# Patient Record
Sex: Male | Born: 1995 | Hispanic: Yes | Smoking: Never smoker
Health system: Southern US, Community
[De-identification: ages and names within clinical notes are randomized; demographics above are authoritative.]

## PROBLEM LIST (undated history)

## (undated) DIAGNOSIS — R569 Unspecified convulsions: Secondary | ICD-10-CM

## (undated) HISTORY — DX: Unspecified convulsions: R56.9

---
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
## 2017-02-20 ENCOUNTER — Encounter: Payer: Self-pay | Admitting: *Deleted

## 2017-02-20 ENCOUNTER — Emergency Department
Admission: EM | Admit: 2017-02-20 | Discharge: 2017-02-20 | Disposition: A | Discharge status: Home / Self Care | Payer: Medicaid Other | Attending: Emergency Medicine | Admitting: Emergency Medicine

## 2017-02-20 DIAGNOSIS — R21 Rash and other nonspecific skin eruption: Secondary | ICD-10-CM | POA: Diagnosis present

## 2017-02-20 DIAGNOSIS — L739 Follicular disorder, unspecified: Secondary | ICD-10-CM

## 2017-02-20 DIAGNOSIS — L02211 Cutaneous abscess of abdominal wall: Secondary | ICD-10-CM | POA: Insufficient documentation

## 2017-02-20 DIAGNOSIS — L02221 Furuncle of abdominal wall: Secondary | ICD-10-CM | POA: Diagnosis not present

## 2017-02-20 DIAGNOSIS — L02219 Cutaneous abscess of trunk, unspecified: Secondary | ICD-10-CM

## 2017-02-20 MED ORDER — DIPHENHYDRAMINE HCL 25 MG PO CAPS
25.0000 mg | ORAL_CAPSULE | Freq: Once | ORAL | Status: AC
Start: 2017-02-20 — End: 2017-02-20
  Administered 2017-02-20: 25 mg via ORAL
  Filled 2017-02-20: qty 1

## 2017-02-20 MED ORDER — SULFAMETHOXAZOLE-TRIMETHOPRIM 800-160 MG PO TABS: 1.0000 | ORAL_TABLET | Freq: Two times a day (BID) | ORAL | 0 refills | Status: DC

## 2017-02-20 MED ORDER — SULFAMETHOXAZOLE-TRIMETHOPRIM 800-160 MG PO TABS
1.0000 | ORAL_TABLET | Freq: Once | ORAL | Status: AC
  Administered 2017-02-20: 1 via ORAL
  Filled 2017-02-20: qty 1

## 2017-02-20 MED ORDER — TRAMADOL HCL 50 MG PO TABS: 50.0000 mg | ORAL_TABLET | Freq: Four times a day (QID) | ORAL | 0 refills | Status: DC | PRN

## 2017-02-20 NOTE — ED Notes (Signed)
Patient reports pain begins at area of abscesses and radiates down right leg

## 2017-02-20 NOTE — ED Provider Notes (Signed)
[facility] Emergency Department Provider Note  ____________________________________________  Time seen: Approximately 8:46 PM  I have reviewed the triage vital signs and the nursing notes.   HISTORY  Chief Complaint Abscess   HPI Mario Davis is a 21 y.o. male who presents to the emergency department for evaluationof pruritic and now painful rash to his pubic area. Symptoms started about 5 days ago. The area started out as just small bumps that itched but have progressively worsened. He has applied calamine lotion without relief of the itching. The initial spot is now an open sore due to scratching. He denies similar symptoms previously or new exposures. He denies fever.  History reviewed. No pertinent past medical history.  Patient Active Problem List   Diagnosis Date Noted  . Abdominal pain in male 04/14/2015    History reviewed. No pertinent surgical history.  Prior to Admission medications   Medication Sig Start Date End Date Taking? Authorizing Provider  sulfamethoxazole-trimethoprim (BACTRIM DS,SEPTRA DS) 800-160 MG tablet Take 1 tablet by mouth 2 (two) times daily. 02/20/17   Chinita Pester, FNP  traMADol (ULTRAM) 50 MG tablet Take 1 tablet (50 mg total) by mouth every 6 (six) hours as needed. 02/20/17   Chinita Pester, FNP    Allergies Patient has no known allergies.  No family history on file.  Social History Social History  Substance Use Topics  . Smoking status: Never Smoker  . Smokeless tobacco: Never Used  . Alcohol use No    Review of Systems  Constitutional: Negative for fever/chills Respiratory: Negative for shortness of breath. Musculoskeletal: Negative for pain. Skin: Positive for rash of the pubic area Neurological: Negative for headaches, focal weakness or numbness. ____________________________________________   PHYSICAL EXAM:  VITAL SIGNS: ED Triage Vitals  Enc Vitals Group     BP 02/20/17 1928 116/71     Pulse  Rate 02/20/17 1932 71     Resp 02/20/17 1928 18     Temp 02/20/17 1928 98.1 F (36.7 C)     Temp Source 02/20/17 1928 Oral     SpO2 02/20/17 1932 98 %     Weight 02/20/17 1928 130 lb (59 kg)     Height 02/20/17 1928 6' (1.829 m)     Head Circumference --      Peak Flow --      Pain Score 02/20/17 1933 9     Pain Loc --      Pain Edu? --      Excl. in GC? --      Constitutional: Alert and oriented. Well appearing and in no acute distress. Eyes: Conjunctivae are normal. EOMI. Mouth/Throat: Mucous membranes are moist.   Lymphatic: No inguinal lymphadenopathy. Cardiovascular: Good peripheral circulation. Respiratory: Normal respiratory effort.  No retractions. Musculoskeletal: FROM throughout. Neurologic:  Normal speech and language. No gross focal neurologic deficits are appreciated. Skin:  Pustules noted over the pubic area. The initial area is open and a scab is beginning to form. There is no active bleeding. Mid pubic area with a raised, tender area that appears to have been draining recently. No area of fluctuance on exam. Area of raised induration is about 2 cm in diameter.  ____________________________________________   LABS (all labs ordered are listed, but only abnormal results are displayed)  Labs Reviewed - No data to display ____________________________________________  EKG  Not indicated ____________________________________________  RADIOLOGY  Not indicated. ____________________________________________   PROCEDURES  Procedure(s) performed: None ____________________________________________   21 year old male presenting to the  emergency department for what appears to be folliculitis and a secondary infection due to scratching on the pubic area. He'll be treated with Bactrim and Benadryl. He was instructed to return to the emergency department in 2-3 days if he is not improving or sooner if worse. He was also given a prescription for tramadol and advised that  he may also take ibuprofen and tylenol.   INITIAL IMPRESSION / ASSESSMENT AND PLAN / ED COURSE   Pertinent labs & imaging results that were available during my care of the patient were reviewed by me and considered in my medical decision making (see chart for details).   ____________________________________________   FINAL CLINICAL IMPRESSION(S) / ED DIAGNOSES  Final diagnoses:  Abscess of pubic region  Folliculitis    Discharge Medication List as of 02/20/2017  9:09 PM    START taking these medications   Details  sulfamethoxazole-trimethoprim (BACTRIM DS,SEPTRA DS) 800-160 MG tablet Take 1 tablet by mouth 2 (two) times daily., Starting Sun 02/20/2017, Print    traMADol (ULTRAM) 50 MG tablet Take 1 tablet (50 mg total) by mouth every 6 (six) hours as needed., Starting Sun 02/20/2017, Print        If controlled substance prescribed during this visit, 12 month history viewed on the [facility] prior to issuing an initial prescription for Schedule II or III opiod.   Note:  This document was prepared using Dragon voice recognition software and may include unintentional dictation errors.    Chinita Pester, FNP 02/20/17 1610    Sharyn Creamer, MD 02/20/17 2356

## 2017-02-20 NOTE — ED Notes (Signed)
Patient c/o multiple abscess to pelvic region. First abscess occurring approx 1 week ago. The first abscess burst approx 5 days ago, and patient has since noticed 4 additional abscesses to the area. Pt has visible swelling/redness to area. Pt has calamine lotion on area.

## 2017-02-20 NOTE — ED Notes (Signed)
Pt. Going home with mother. 

## 2017-02-20 NOTE — ED Triage Notes (Signed)
Pt reports he first noticed an area of redness and drainage on his lower abdomen for days ago. Now with multiple small abscesses to the lower abdominal area that are draining. Applied calamine lotion to the area PTA. Denies fevers.

## 2017-08-05 ENCOUNTER — Emergency Department (HOSPITAL_COMMUNITY)
Admission: EM | Admit: 2017-08-05 | Discharge: 2017-08-05 | Disposition: A | Discharge status: Home / Self Care | Payer: Medicaid Other | Attending: Emergency Medicine | Admitting: Emergency Medicine

## 2017-08-05 ENCOUNTER — Encounter (HOSPITAL_COMMUNITY): Payer: Self-pay | Admitting: Emergency Medicine

## 2017-08-05 ENCOUNTER — Emergency Department (HOSPITAL_COMMUNITY): Payer: Medicaid Other

## 2017-08-05 DIAGNOSIS — Y929 Unspecified place or not applicable: Secondary | ICD-10-CM | POA: Diagnosis not present

## 2017-08-05 DIAGNOSIS — M25511 Pain in right shoulder: Secondary | ICD-10-CM | POA: Diagnosis not present

## 2017-08-05 DIAGNOSIS — Y939 Activity, unspecified: Secondary | ICD-10-CM | POA: Diagnosis not present

## 2017-08-05 DIAGNOSIS — Z79899 Other long term (current) drug therapy: Secondary | ICD-10-CM | POA: Diagnosis not present

## 2017-08-05 DIAGNOSIS — W228XXA Striking against or struck by other objects, initial encounter: Secondary | ICD-10-CM | POA: Diagnosis not present

## 2017-08-05 DIAGNOSIS — Y990 Civilian activity done for income or pay: Secondary | ICD-10-CM | POA: Insufficient documentation

## 2017-08-05 MED ORDER — NAPROXEN 500 MG PO TABS: 500.0000 mg | ORAL_TABLET | Freq: Two times a day (BID) | ORAL | 0 refills | Status: DC

## 2017-08-05 NOTE — ED Notes (Signed)
ED Provider at bedside. 

## 2017-08-05 NOTE — Discharge Instructions (Signed)
Please read and follow all provided instructions.  You have been seen today for right shoulder pain  Tests performed today include: An x-ray of the affected area - does NOT show any broken bones or dislocations.  Vital signs. See below for your results today.   Home care instructions: -- *PRICE in the first 24-48 hours after injury: Protect (with brace, splint, sling), if given by your provider - take your arm out of the sling daily and perform shoulder exercises.  Rest Ice- Do not apply ice pack directly to your skin, place towel or similar between your skin and ice/ice pack. Apply ice for 20 min, then remove for 40 min while awake Compression- Wear brace, elastic bandage, splint as directed by your provider Elevate affected extremity above the level of your heart when not walking around for the first 24-48 hours   Take naproxen as directed for pain.   Follow-up instructions: Please follow-up with your primary care provider or the provided orthopedic physician (bone specialist) if you continue to have significant pain in 1 week. In this case you may have a more severe injury that requires further care.   Return instructions:  Please return if your toes or feet are numb or tingling, appear gray or blue, or you have severe pain (also elevate the leg and loosen splint or wrap if you were given one) Please return to the Emergency Department if you experience worsening symptoms.  Please return if you have any other emergent concerns. Additional Information:  Your vital signs today were: BP (!) 104/54 (BP Location: Left Arm)    Pulse 64    Temp 98 F (36.7 C) (Oral)    Resp 18    Ht  (1.803 m)    Wt 59 kg (130 lb)    SpO2 100%    BMI 18.13 kg/m  If your blood pressure (BP) was elevated above 135/85 this visit, please have this repeated by your doctor within one month. ---------------

## 2017-08-05 NOTE — ED Triage Notes (Addendum)
Pt c/o right shoulder pain after a generator hit him in the shoulder while at work on Wednesday. Pt works for [employer] and pain worse today with movement.

## 2017-08-05 NOTE — Progress Notes (Signed)
Orthopedic Tech Progress Note Patient Details:  Mario Davis Jul 16, 1996 [id number]  Ortho Devices Type of Ortho Device: Shoulder immobilizer Ortho Device/Splint Location: right arm  Ortho Device/Splint Interventions: Application, Adjustment   [name] 08/05/2017, 10:01 PM

## 2017-08-05 NOTE — ED Provider Notes (Signed)
[facility]-EMERGENCY DEPT Provider Note   CSN: [id number] Arrival date & time: 08/05/17  1725     History   Chief Complaint Chief Complaint  Patient presents with  . Shoulder Injury    HPI Mario Davis is a 21 y.o. male who presents emergency department today for right shoulder pain x 2 days. 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 He did not have any immediate pain. The next day the patient was throwing a bag over a truck when he noticed pain in his right anterior and posterior shoulder without radiation down the arm. He states that this lasted for approximately 3 hours. When he awoke this morning after sleeping on the right shoulder he had this similar pain that he describes as a sharp, nagging pain. The pain is worse with overhead movements, especially when the patient is loading boxes overhead. The patient has not taken anything for this. He denies decreased ROM, redness, swelling, prior surgeries, or numbness/tingiling/weakness of the extremity.    HPI  History reviewed. No pertinent past medical history.  Patient Active Problem List   Diagnosis Date Noted  . Abdominal pain in male 04/14/2015    History reviewed. No pertinent surgical history.     Home Medications    Prior to Admission medications   Medication Sig Start Date End Date Taking? Authorizing Provider  naproxen (NAPROSYN) 500 MG tablet Take 1 tablet (500 mg total) by mouth 2 (two) times daily. 08/05/17   Maczis, Elmer Sow, PA-C  sulfamethoxazole-trimethoprim (BACTRIM DS,SEPTRA DS) 800-160 MG tablet Take 1 tablet by mouth 2 (two) times daily. 02/20/17   Triplett, Rulon Eisenmenger B, FNP  traMADol (ULTRAM) 50 MG tablet Take 1 tablet (50 mg total) by mouth every 6 (six) hours as needed. 02/20/17   Chinita Pester, FNP    Family History No family history on file.  Social History Social History    Substance Use Topics  . Smoking status: Never Smoker  . Smokeless tobacco: Never Used  . Alcohol use No     Allergies   Patient has no known allergies.   Review of Systems Review of Systems  Constitutional: Negative for chills and fever.  Musculoskeletal: Positive for arthralgias. Negative for joint swelling and neck stiffness.  Skin: Negative for wound.  Neurological: Negative for weakness and numbness.     Physical Exam Updated Vital Signs BP 101/62 (BP Location: Right Arm)   Pulse 66   Temp 98 F (36.7 C) (Oral)   Resp 16   Ht  (1.803 m)   Wt 59 kg (130 lb)   SpO2 98%   BMI 18.13 kg/m   Physical Exam  Constitutional: He appears well-developed and well-nourished.  HENT:  Head: Normocephalic and atraumatic.  Right Ear: External ear normal.  Left Ear: External ear normal.  Eyes: Conjunctivae are normal. Right eye exhibits no discharge. Left eye exhibits no discharge. No scleral icterus.  Pulmonary/Chest: Effort normal. No respiratory distress.  Musculoskeletal:  Cervical Spine: Appearance normal. No obvious bony deformity. No skin swelling, erythema, heat, fluctuance or break of the skin. No TTP over the cervical spinous processes. No paraspinal tenderness. No step-offs. Patient is able to actively rotate their neck 45 degrees left and right voluntarily without pain and flex and extend the neck without pain. .  Left Shoulder: Appearance normal. No obvious bony deformity, or skin tenting.  No skin swelling, erythema, heat, fluctuance or break of the skin. TTP over anterior shoulder, scapular spine and medial scapular border. Active and passive flexion, extension, abduction, adduction, and internal/external rotation intact without crepitus. Pain noted with abduction and flexion. Strength for flexion, extension, abduction, adduction, and internal/external rotation intact and appropriate for age. Negative drop arm test. Negative Hawkin's test. Positive Neer's test.  Negative lift off test.  Left Elbow: Appearance normal. No obvious bony deformity. No skin swelling, erythema, heat, fluctuance or break of the skin. No TTP over joint. Active flexion, extension, supination and pronation full and intact without pain. Strength able and appropriate for age for flexion and extension.  Radial Pulse 2+. Cap refill <2 seconds. SILT for M/U/R distributions. Compartments soft.   Neurological: He is alert.  Skin: No pallor.  Psychiatric: He has a normal mood and affect.  Nursing note and vitals reviewed.    ED Treatments / Results  Labs (all labs ordered are listed, but only abnormal results are displayed) Labs Reviewed - No data to display  EKG  EKG Interpretation None       Radiology Dg Shoulder Right  Result Date: 08/05/2017 CLINICAL DATA:  Right anterior shoulder pain after an injury two days prior to imaging. Pain worsens with movement and with lifting. EXAM: RIGHT SHOULDER - 2+ VIEW COMPARISON:  None. FINDINGS: There is no evidence of fracture or dislocation. There is no evidence of arthropathy or other focal bone abnormality. IMPRESSION: 1. No specific abnormality is identified. If pain persists despite conservative therapy, MRI may be warranted for further characterization. Electronically Signed   By: Gaylyn Rong M.D.   On: 08/05/2017 18:27    Procedures Procedures (including critical care time)  Medications Ordered in ED Medications - No data to display   Initial Impression / Assessment and Plan / ED Course  I have reviewed the triage vital signs and the nursing notes.  Pertinent labs & imaging results that were available during my care of the patient were reviewed by me and considered in my medical decision making (see chart for details).     21 year old presenting with right shoulder pain. Exam and history concerning for Rotator cuff origin vs impingement. Special tests without obvious RC tear. Possibly tendonitis. Patient X-Ray  negative for obvious fracture or dislocation. Pt advised to follow up with orthopedics if symptoms persist for possibility of missed fracture/tear. Patient given brace while in ED, and conservative therapy recommended and discussed. Home shoulder exercises given. Advised the patient not to stay in sling and to do shoulder exercises daily to prevent frozen shoulder. Patient will be dc home & is agreeable with above plan.   Final Clinical Impressions(s) / ED Diagnoses   Final diagnoses:  Acute pain of right shoulder    New Prescriptions Discharge Medication List as of 08/05/2017  9:51 PM    START taking these medications   Details  naproxen (NAPROSYN) 500 MG tablet Take 1 tablet (500 mg total) by mouth 2 (two) times daily., Starting Fri 08/05/2017, Print         Maczis, Elmer Sow, PA-C 08/06/17 0235    Shaune Pollack, MD 08/06/17 1114

## 2017-11-21 ENCOUNTER — Other Ambulatory Visit: Payer: Self-pay

## 2017-11-21 ENCOUNTER — Encounter (HOSPITAL_COMMUNITY): Payer: Self-pay | Admitting: Emergency Medicine

## 2017-11-21 DIAGNOSIS — R1030 Lower abdominal pain, unspecified: Secondary | ICD-10-CM | POA: Insufficient documentation

## 2017-11-21 DIAGNOSIS — R300 Dysuria: Secondary | ICD-10-CM | POA: Diagnosis not present

## 2017-11-21 DIAGNOSIS — N342 Other urethritis: Secondary | ICD-10-CM | POA: Diagnosis not present

## 2017-11-21 DIAGNOSIS — R350 Frequency of micturition: Secondary | ICD-10-CM | POA: Diagnosis present

## 2017-11-21 LAB — URINALYSIS, ROUTINE W REFLEX MICROSCOPIC
Bilirubin Urine: NEGATIVE
Glucose, UA: NEGATIVE mg/dL
Hgb urine dipstick: NEGATIVE
Ketones, ur: NEGATIVE mg/dL
Nitrite: NEGATIVE
Protein, ur: NEGATIVE mg/dL
SPECIFIC GRAVITY, URINE: 1.027 (ref 1.005–1.030)
SQUAMOUS EPITHELIAL / LPF: NONE SEEN
pH: 6 (ref 5.0–8.0)

## 2017-11-21 LAB — CBC
HCT: 44.8 % (ref 39.0–52.0)
Hemoglobin: 15.7 g/dL (ref 13.0–17.0)
MCH: 31.8 pg (ref 26.0–34.0)
MCHC: 35 g/dL (ref 30.0–36.0)
MCV: 90.9 fL (ref 78.0–100.0)
PLATELETS: 222 10*3/uL (ref 150–400)
RBC: 4.93 MIL/uL (ref 4.22–5.81)
RDW: 12.9 % (ref 11.5–15.5)
WBC: 5.9 10*3/uL (ref 4.0–10.5)

## 2017-11-21 LAB — COMPREHENSIVE METABOLIC PANEL
ALT: 15 U/L — AB (ref 17–63)
AST: 18 U/L (ref 15–41)
Albumin: 4.2 g/dL (ref 3.5–5.0)
Alkaline Phosphatase: 68 U/L (ref 38–126)
Anion gap: 6 (ref 5–15)
BUN: 14 mg/dL (ref 6–20)
CHLORIDE: 102 mmol/L (ref 101–111)
CO2: 28 mmol/L (ref 22–32)
CREATININE: 0.84 mg/dL (ref 0.61–1.24)
Calcium: 9.5 mg/dL (ref 8.9–10.3)
GFR calc Af Amer: >60 mL/min (ref >60)
GFR calc non Af Amer: >60 mL/min (ref >60)
Glucose, Bld: 82 mg/dL (ref 65–99)
Potassium: 3.8 mmol/L (ref 3.5–5.1)
SODIUM: 136 mmol/L (ref 135–145)
Total Bilirubin: 1.2 mg/dL (ref 0.3–1.2)
Total Protein: 7.1 g/dL (ref 6.5–8.1)

## 2017-11-21 LAB — LIPASE, BLOOD: LIPASE: 23 U/L (ref 11–51)

## 2017-11-21 NOTE — ED Triage Notes (Signed)
Pt. Stated, my LOWER STOMACH IS HURTING AND IM NOT ABLE TO PEE SINCE YESTERDAY

## 2017-11-22 ENCOUNTER — Emergency Department (HOSPITAL_COMMUNITY)
Admission: EM | Admit: 2017-11-22 | Discharge: 2017-11-22 | Disposition: A | Discharge status: Home / Self Care | Payer: Medicaid Other | Attending: Emergency Medicine | Admitting: Emergency Medicine

## 2017-11-22 DIAGNOSIS — N342 Other urethritis: Secondary | ICD-10-CM

## 2017-11-22 MED ORDER — STERILE WATER FOR INJECTION IJ SOLN
INTRAMUSCULAR | Status: AC
  Filled 2017-11-22: qty 10

## 2017-11-22 MED ORDER — CEFTRIAXONE SODIUM 250 MG IJ SOLR
250.0000 mg | Freq: Once | INTRAMUSCULAR | Status: AC
  Administered 2017-11-22: 250 mg via INTRAMUSCULAR
  Filled 2017-11-22: qty 250

## 2017-11-22 MED ORDER — CEPHALEXIN 500 MG PO CAPS: 500.0000 mg | ORAL_CAPSULE | Freq: Four times a day (QID) | ORAL | 0 refills | Status: DC

## 2017-11-22 MED ORDER — AZITHROMYCIN 250 MG PO TABS
1000.0000 mg | ORAL_TABLET | Freq: Once | ORAL | Status: AC
  Administered 2017-11-22: 1000 mg via ORAL
  Filled 2017-11-22: qty 4

## 2017-11-22 NOTE — ED Provider Notes (Signed)
[facility] EMERGENCY DEPARTMENT Provider Note   CSN: [id number] Arrival date & time: 11/21/17  1723     History   Chief Complaint Chief Complaint  Patient presents with  . Abdominal Pain  . Urinary Retention    HPI Mario Davis is a 22 y.o. male.  Patient presents to the emergency department with chief complaint of urinary frequency, urgency, dysuria.  He states the symptoms started yesterday.  They have been persistent.  He also reports some lower abdominal pain over his bladder.  He denies any hematuria or discharge.  Denies any unprotected sexual contacts.  Denies fevers or chills.  He has not taken anything for his symptoms.  There are no modifying factors.   The history is provided by the patient. No language interpreter was used.    History reviewed. No pertinent past medical history.  Patient Active Problem List   Diagnosis Date Noted  . Abdominal pain in male 04/14/2015    History reviewed. No pertinent surgical history.     Home Medications    Prior to Admission medications   Medication Sig Start Date End Date Taking? Authorizing Provider  naproxen (NAPROSYN) 500 MG tablet Take 1 tablet (500 mg total) by mouth 2 (two) times daily. 08/05/17   Maczis, Elmer Sow, PA-C  sulfamethoxazole-trimethoprim (BACTRIM DS,SEPTRA DS) 800-160 MG tablet Take 1 tablet by mouth 2 (two) times daily. 02/20/17   Triplett, Rulon Eisenmenger B, FNP  traMADol (ULTRAM) 50 MG tablet Take 1 tablet (50 mg total) by mouth every 6 (six) hours as needed. 02/20/17   Chinita Pester, FNP    Family History No family history on file.  Social History Social History   Tobacco Use  . Smoking status: Never Smoker  . Smokeless tobacco: Never Used  Substance Use Topics  . Alcohol use: No  . Drug use: No     Allergies   Patient has no known allergies.   Review of Systems Review of Systems  All other systems reviewed and are negative.    Physical Exam Updated Vital  Signs BP (!) 102/59 (BP Location: Right Arm)   Pulse 71   Temp 98.2 F (36.8 C) (Oral)   Resp 18   Ht 6' (1.829 m)   Wt 56.7 kg (125 lb)   SpO2 98%   BMI 16.95 kg/m   Physical Exam  Constitutional: He is oriented to person, place, and time. He appears well-developed and well-nourished.  HENT:  Head: Normocephalic and atraumatic.  Eyes: Conjunctivae and EOM are normal. Pupils are equal, round, and reactive to light. Right eye exhibits no discharge. Left eye exhibits no discharge. No scleral icterus.  Neck: Normal range of motion. Neck supple. No JVD present.  Cardiovascular: Normal rate, regular rhythm and normal heart sounds. Exam reveals no gallop and no friction rub.  No murmur heard. Pulmonary/Chest: Effort normal and breath sounds normal. No respiratory distress. He has no wheezes. He has no rales. He exhibits no tenderness.  Abdominal: Soft. He exhibits no distension and no mass. There is no tenderness. There is no rebound and no guarding.  No focal abdominal tenderness, no RLQ tenderness or pain at McBurney's point, no RUQ tenderness or Murphy's sign, no left-sided abdominal tenderness, no fluid wave, or signs of peritonitis   Musculoskeletal: Normal range of motion. He exhibits no edema or tenderness.  Neurological: He is alert and oriented to person, place, and time.  Skin: Skin is warm and dry.  Psychiatric: He has a normal  mood and affect. His behavior is normal. Judgment and thought content normal.  Nursing note and vitals reviewed.    ED Treatments / Results  Labs (all labs ordered are listed, but only abnormal results are displayed) Labs Reviewed  URINALYSIS, ROUTINE W REFLEX MICROSCOPIC - Abnormal; Notable for the following components:      Result Value   Leukocytes, UA TRACE (*)    Bacteria, UA FEW (*)    All other components within normal limits  COMPREHENSIVE METABOLIC PANEL - Abnormal; Notable for the following components:   ALT 15 (*)    All other  components within normal limits  LIPASE, BLOOD  CBC    EKG  EKG Interpretation None       Radiology No results found.  Procedures Procedures (including critical care time)  Medications Ordered in ED Medications  cefTRIAXone (ROCEPHIN) injection 250 mg (not administered)  azithromycin (ZITHROMAX) tablet 1,000 mg (not administered)     Initial Impression / Assessment and Plan / ED Course  I have reviewed the triage vital signs and the nursing notes.  Pertinent labs & imaging results that were available during my care of the patient were reviewed by me and considered in my medical decision making (see chart for details).     Patient with urinary frequency, urgency, dysuria.  He denies any penile discharge.  Denies any unprotected sexual contacts.  Urinalysis has too numerous to count white blood cells.  Will treat for UTI, but will also cover for common STDs.  Recommend PCP follow-up.  Final Clinical Impressions(s) / ED Diagnoses   Final diagnoses:  Urethritis    ED Discharge Orders        Ordered    cephALEXin (KEFLEX) 500 MG capsule  4 times daily     11/22/17 0113       Roxy HorsemanBrowning, Tijuan Dantes, PA-C 11/22/17 0113    Palumbo, April, MD 11/22/17 [id number]

## 2017-12-25 ENCOUNTER — Emergency Department
Admission: EM | Admit: 2017-12-25 | Discharge: 2017-12-25 | Disposition: A | Discharge status: Home / Self Care | Payer: Medicaid Other | Attending: Emergency Medicine | Admitting: Emergency Medicine

## 2017-12-25 ENCOUNTER — Encounter: Payer: Self-pay | Admitting: Emergency Medicine

## 2017-12-25 ENCOUNTER — Emergency Department: Payer: Medicaid Other

## 2017-12-25 ENCOUNTER — Other Ambulatory Visit: Payer: Self-pay

## 2017-12-25 DIAGNOSIS — Y92318 Other athletic court as the place of occurrence of the external cause: Secondary | ICD-10-CM | POA: Insufficient documentation

## 2017-12-25 DIAGNOSIS — Y9366 Activity, soccer: Secondary | ICD-10-CM | POA: Diagnosis not present

## 2017-12-25 DIAGNOSIS — Y998 Other external cause status: Secondary | ICD-10-CM | POA: Diagnosis not present

## 2017-12-25 DIAGNOSIS — S9001XA Contusion of right ankle, initial encounter: Secondary | ICD-10-CM | POA: Insufficient documentation

## 2017-12-25 DIAGNOSIS — S99911A Unspecified injury of right ankle, initial encounter: Secondary | ICD-10-CM | POA: Diagnosis present

## 2017-12-25 MED ORDER — MELOXICAM 15 MG PO TABS: 15.0000 mg | ORAL_TABLET | Freq: Every day | ORAL | 0 refills | Status: DC

## 2017-12-25 MED ORDER — HYDROCODONE-ACETAMINOPHEN 5-325 MG PO TABS
1.0000 | ORAL_TABLET | Freq: Once | ORAL | Status: AC
  Administered 2017-12-25: 1 via ORAL
  Filled 2017-12-25: qty 1

## 2017-12-25 MED ORDER — ONDANSETRON 8 MG PO TBDP
8.0000 mg | ORAL_TABLET | Freq: Once | ORAL | Status: AC
  Administered 2017-12-25: 8 mg via ORAL
  Filled 2017-12-25: qty 1

## 2017-12-25 NOTE — ED Provider Notes (Signed)
[facility] Emergency Department Provider Note  ____________________________________________  Time seen: Approximately 8:50 PM  I have reviewed the triage vital signs and the nursing notes.   HISTORY  Chief Complaint Ankle Pain    HPI Mario Davis is a 22 y.o. male who presents the emergency department complaining of right ankle pain.  Patient reports that he was playing indoor soccer when another player on the opposite team became upset, pushed him into the wall, and kicked him in the right ankle.  Patient reports that he has pain and swelling to the lateral ankle.  He reports bearing any weight is excruciating.  Patient was able to put very minimal weight on the ankle after the injury.  No other injury or complaint.  No medications for this complaint prior to arrival.  History reviewed. No pertinent past medical history.  Patient Active Problem List   Diagnosis Date Noted  . Abdominal pain in male 04/14/2015    History reviewed. No pertinent surgical history.  Prior to Admission medications   Medication Sig Start Date End Date Taking? Authorizing Provider  meloxicam (MOBIC) 15 MG tablet Take 1 tablet (15 mg total) by mouth daily. 12/25/17   Morell Mears, Delorise RoyalsJonathan D, PA-C    Allergies Patient has no known allergies.  History reviewed. No pertinent family history.  Social History Social History   Tobacco Use  . Smoking status: Never Smoker  . Smokeless tobacco: Never Used  Substance Use Topics  . Alcohol use: No  . Drug use: No     Review of Systems  Constitutional: No fever/chills Eyes: No visual changes.  Cardiovascular: no chest pain. Respiratory: no cough. No SOB. Gastrointestinal: No abdominal pain.  No nausea, no vomiting.  Musculoskeletal: Positive for right ankle pain Skin: Negative for rash, abrasions, lacerations, ecchymosis. Neurological: Negative for headaches, focal weakness or numbness. 10-point ROS otherwise  negative.  ____________________________________________   PHYSICAL EXAM:  VITAL SIGNS: ED Triage Vitals [12/25/17 2024]  Enc Vitals Group     BP 115/68     Pulse Rate 99     Resp 16     Temp 97.7 F (36.5 C)     Temp src      SpO2 99 %     Weight 123 lb (55.8 kg)     Height 6' (1.829 m)     Head Circumference      Peak Flow      Pain Score 7     Pain Loc      Pain Edu?      Excl. in GC?      Constitutional: Alert and oriented. Well appearing and in no acute distress. Eyes: Conjunctivae are normal. PERRL. EOMI. Head: Atraumatic. Neck: No stridor.    Cardiovascular: Normal rate, regular rhythm. Normal S1 and S2.  Good peripheral circulation. Respiratory: Normal respiratory effort without tachypnea or retractions. Lungs CTAB. Good air entry to the bases with no decreased or absent breath sounds. Musculoskeletal: Full range of motion to all extremities. No gross deformities appreciated.  No gross deformity noted to right ankle upon inspection.  Mild edema is appreciated to the lateral right ankle.  No ecchymosis.  No abrasions or lacerations noted.  Patient has limited range of motion due to pain.  Patient is very tender to palpation along the talonavicular joint line, base of the fifth meta tarsal, lateral malleolus.  No palpable abnormality.  Dorsalis pedis pulse intact.  Sensation intact all 5 digits. Neurologic:  Normal speech and language.  No gross focal neurologic deficits are appreciated.  Skin:  Skin is warm, dry and intact. No rash noted. Psychiatric: Mood and affect are normal. Speech and behavior are normal. Patient exhibits appropriate insight and judgement.   ____________________________________________   LABS (all labs ordered are listed, but only abnormal results are displayed)  Labs Reviewed - No data to display ____________________________________________  EKG   ____________________________________________  RADIOLOGY Festus Barren Reford Olliff,  personally viewed and evaluated these images (plain radiographs) as part of my medical decision making, as well as reviewing the written report by the radiologist.  I concur with the radiologist reported no acute fractures.  Dg Ankle Complete Right  Result Date: 12/25/2017 CLINICAL DATA:  Kicked in right ankle while playing soccer. Right lateral ankle pain and swelling. Initial encounter. EXAM: RIGHT ANKLE - COMPLETE 3+ VIEW COMPARISON:  None. FINDINGS: There is no evidence of fracture or dislocation. The ankle mortise is intact; the interosseous space is within normal limits. No talar tilt or subluxation is seen. The joint spaces are preserved. No significant soft tissue abnormalities are seen. IMPRESSION: No evidence of fracture or dislocation. Electronically Signed   By: Roanna Raider M.D.   On: 12/25/2017 21:11    ____________________________________________    PROCEDURES  Procedure(s) performed:    Procedures    Medications  HYDROcodone-acetaminophen (NORCO/VICODIN) 5-325 MG per tablet 1 tablet (1 tablet Oral Given 12/25/17 2056)  ondansetron (ZOFRAN-ODT) disintegrating tablet 8 mg (8 mg Oral Given 12/25/17 2056)     ____________________________________________   INITIAL IMPRESSION / ASSESSMENT AND PLAN / ED COURSE  Pertinent labs & imaging results that were available during my care of the patient were reviewed by me and considered in my medical decision making (see chart for details).  Review of the [place] CSRS was performed in accordance of the [facility] prior to dispensing any controlled drugs.     Patient's diagnosis is consistent with ankle contusion.  Initial differential included fracture versus sprain.  No evidence of acute musculoskeletal abnormality on x-ray.  Exam is reassuring.  Patient is given crutches for ambulation.. Patient will be discharged home with prescriptions for meloxicam for symptom improvement. Patient is to follow up with orthopedics as needed or otherwise  directed. Patient is given ED precautions to return to the ED for any worsening or new symptoms.     ____________________________________________  FINAL CLINICAL IMPRESSION(S) / ED DIAGNOSES  Final diagnoses:  Contusion of right ankle, initial encounter      NEW MEDICATIONS STARTED DURING THIS VISIT:  ED Discharge Orders        Ordered    meloxicam (MOBIC) 15 MG tablet  Daily     12/25/17 2120          This chart was dictated using voice recognition software/Dragon. Despite best efforts to proofread, errors can occur which can change the meaning. Any change was purely unintentional.    Racheal Patches, PA-C 12/25/17 2126    Dionne Bucy, MD 12/25/17 2325

## 2017-12-25 NOTE — ED Triage Notes (Signed)
Pt says he was playing indoor soccer this evening; was pushed into a wall, jumped up, and another player kicked him in his right ankle; pt says any weight on ankle increases pain; rates pain 7/10 at rest, 10/10 with ambulation; pain to outer ankle area; mild swelling noted;

## 2018-01-17 ENCOUNTER — Emergency Department (HOSPITAL_COMMUNITY): Payer: Medicaid Other

## 2018-01-17 ENCOUNTER — Encounter (HOSPITAL_COMMUNITY): Payer: Self-pay

## 2018-01-17 ENCOUNTER — Emergency Department (HOSPITAL_COMMUNITY)
Admission: EM | Admit: 2018-01-17 | Discharge: 2018-01-17 | Disposition: A | Discharge status: Home / Self Care | Payer: Medicaid Other | Attending: Emergency Medicine | Admitting: Emergency Medicine

## 2018-01-17 DIAGNOSIS — Z79899 Other long term (current) drug therapy: Secondary | ICD-10-CM | POA: Diagnosis not present

## 2018-01-17 DIAGNOSIS — X500XXA Overexertion from strenuous movement or load, initial encounter: Secondary | ICD-10-CM | POA: Insufficient documentation

## 2018-01-17 DIAGNOSIS — S93401A Sprain of unspecified ligament of right ankle, initial encounter: Secondary | ICD-10-CM | POA: Insufficient documentation

## 2018-01-17 DIAGNOSIS — Y92002 Bathroom of unspecified non-institutional (private) residence single-family (private) house as the place of occurrence of the external cause: Secondary | ICD-10-CM | POA: Diagnosis not present

## 2018-01-17 DIAGNOSIS — S99911A Unspecified injury of right ankle, initial encounter: Secondary | ICD-10-CM | POA: Diagnosis present

## 2018-01-17 DIAGNOSIS — Y93E1 Activity, personal bathing and showering: Secondary | ICD-10-CM | POA: Diagnosis not present

## 2018-01-17 DIAGNOSIS — Y999 Unspecified external cause status: Secondary | ICD-10-CM | POA: Diagnosis not present

## 2018-01-17 NOTE — ED Triage Notes (Signed)
Pt presents with R ankle pain after slipping in shower with twisting injury x 2 days ago.  Pt had injured same ankle 3 weeks ago playing soccer, was seen by PCP, given anti-inflammatory meds which helped. Pt able to ambulate but with pain.

## 2018-01-17 NOTE — ED Notes (Signed)
Patient transported to X-ray 

## 2018-01-17 NOTE — Discharge Instructions (Signed)
Please read and follow all provided instructions.  Your diagnoses today include: Ankle sprain An ankle sprain is an injury to the ligaments that hold the ankle joint together causing them to get stretched or torn. It may take 4-6 weeks to heal fully. Your X-ray today showed no evidence of fracture (there is no evidence of broken bones).  For activity: Use crutches with non-weightbearing for the first few days. Exercises should be limited to pain free range of motion. You can start mobilization by tracing the alphabet with you foot in the air. Then, you may walk on your ankles as the pain allows (or as instructed). Start gradually with weight bearing on the affected ankle. Once you can walk pain free, then try jogging. When you can run forwards, then you can try moving side to side. If you cannot walk without crutches in one week, you need a recheck by your Family Doctor. It is important to keep all follow-up appointments as we discussed fractures may not appear until 1 week to 10 days after the acute injury. If you do not have a family doctor to follow-up with, you can see the list of phone numbers below. Please call today to make a followup appointment.  TREATMENT  Rest, ice, compression and elevation (RICE therapy) are the basic modes of treatment.   Rest is needed to allow your body to heal. Routine activities can be resumed when comfortable (as described above). Injury tendons and bones can take up to 6 weeks to heal. Tendons are cordlike structures that attach muscles and bones Ice: Apply ice to the sore area for 15 to 20 minutes, 3 to 4 times per day. Do this while you are awake for the first 2 days, or as directed. This can help reduce swelling and reduce pain.  Compression: this helps keep swelling down. It also gives support and helps with discomfort. If any lasting bandage has been applied, it should be removed and reapplied every 3-4 hours. It should not be applied tightly, but firmly enough to  keep swelling down. Watch fingers or toes for swelling, discoloration, coldness, numbness or excessive pain. If any of these problems occur, removed the bandage and reapply loosely. Contact your caregiver if these problems continue. If you were given an ankle stabilizer you may take it off at night and to take a shower or bath. Wiggle your toes in the splint several times per day if you are able.  Elevation helps reduce swelling and decrease your pain. With extremities such as the arms, hands, legs and feet, the injured area should be placed near or above the level of the heart if possible (place pillows underneath you leg/foot while you sleep to achieve this).  HOW TO MAKE AN ICE PACK  To make an ice pack, do one of the following:  Place crushed ice or a bag of frozen vegetables in a sealable plastic bag. Squeeze out the excess air. Place this bag inside another plastic bag. Slide the bag into a pillowcase or place a damp towel between your skin and the bag.  Mix 3 parts water with 1 part rubbing alcohol. Freeze the mixture in a sealable plastic bag. When you remove the mixture from the freezer, it will be slushy. Squeeze out the excess air. Place this bag inside another plastic bag. Slide the bag into a pillowcase or place a damp towel between your sk  Seek immediate medical attention if: You're toes are numb or tingling, appear gray or blue, or  you have severe pain. If this occurs please also elevate the leg and loosen the splint. Also if you have persistent pain and swelling, developed redness numbness or unexpected weakness, or your symptoms are getting worse rather than improving after several days. The symptoms may indicate that further evaluation or further x-rays are needed. Sometimes, x-rays may not show a small broken bone until one week or 10 days later. Make a follow-up appointment with your caregiver.  Additional Information:  Your vital signs today were: BP 116/68    Pulse 83    Temp 98 F  (36.7 C)    Resp 18    SpO2 98%  If your blood pressure (BP) was elevated above 135/85 this visit, please have this repeated by your doctor within one month. ---------------

## 2018-01-17 NOTE — ED Notes (Signed)
Pt verbalized understanding of discharge instructions and denies any further questions at this time.   

## 2018-01-17 NOTE — ED Notes (Signed)
ED Provider at bedside. 

## 2018-01-17 NOTE — ED Provider Notes (Signed)
[facility] EMERGENCY DEPARTMENT Provider Note   CSN: [id number] Arrival date & time: 01/17/18  1455     History   Chief Complaint No chief complaint on file.   HPI Mario Davis is a 22 y.o. male with no significant past medical history presents emergency department today for right ankle pain.  Patient states that several weeks ago while playing soccer he was kicked on the lateral aspect of his right ankle/foot.  He was seen at [facility] where he was told he had a "bruised bone" and given crutches.  He did not use the crutches but is been taking Mobic for symptoms with some relief.  2 days ago while in the shower he notes that he slipped on conditioner causing him to invert his right ankle.  He denies fall or head trauma.  He is now complaining of lateral right ankle pain that is worse with ambulation and movement.  He has been able to bear weight on this but notes it is painful.  He has been taking Mobic for symptoms with some relief.  Patient denies any open wounds, numbness/tingling/weakness.  HPI  History reviewed. No pertinent past medical history.  Patient Active Problem List   Diagnosis Date Noted  . Abdominal pain in male 04/14/2015    History reviewed. No pertinent surgical history.     Home Medications    Prior to Admission medications   Medication Sig Start Date End Date Taking? Authorizing Provider  meloxicam (MOBIC) 15 MG tablet Take 1 tablet (15 mg total) by mouth daily. 12/25/17   Cuthriell, Delorise Royals, PA-C    Family History History reviewed. No pertinent family history.  Social History Social History   Tobacco Use  . Smoking status: Never Smoker  . Smokeless tobacco: Never Used  Substance Use Topics  . Alcohol use: No  . Drug use: No     Allergies   Patient has no known allergies.   Review of Systems Review of Systems  Musculoskeletal: Positive for arthralgias. Negative for joint swelling.  Skin: Negative for wound.    Neurological: Negative for weakness and numbness.  All other systems reviewed and are negative.    Physical Exam Updated Vital Signs BP 116/68   Pulse 83   Temp 98 F (36.7 C)   Resp 18   SpO2 98%   Physical Exam  Constitutional: He appears well-developed and well-nourished.  HENT:  Head: Normocephalic and atraumatic.  Right Ear: External ear normal.  Left Ear: External ear normal.  Eyes: Conjunctivae are normal. Right eye exhibits no discharge. Left eye exhibits no discharge. No scleral icterus.  Cardiovascular:  Pulses:      Dorsalis pedis pulses are 2+ on the right side.       Posterior tibial pulses are 2+ on the right side.  Pulmonary/Chest: Effort normal. No respiratory distress.  Musculoskeletal:       Right ankle: He exhibits decreased range of motion (able but decreased 2/2 to pain). He exhibits no swelling, no ecchymosis and no deformity. Tenderness. Lateral malleolus tenderness found. Achilles tendon normal. Achilles tendon exhibits no pain and normal Thompson's test results.       Right foot: Normal. There is normal range of motion, no tenderness, no bony tenderness and no swelling.       Feet:  Neurological: He is alert. He has normal strength. No sensory deficit.  Skin: Skin is warm, dry and intact. Capillary refill takes less than 2 seconds. No erythema. No pallor.  Psychiatric: He has a normal mood and affect.  Nursing note and vitals reviewed.    ED Treatments / Results  Labs (all labs ordered are listed, but only abnormal results are displayed) Labs Reviewed - No data to display  EKG  EKG Interpretation None       Radiology Dg Ankle 2 Views Right  Result Date: 01/17/2018 CLINICAL DATA:  Larey SeatFell 2 days ago ovary injuring RIGHT ankle, pain and swelling at lateral malleolus and at lateral tarsals, a original injury 3 weeks ago playing soccer, was told he had a tiny fracture EXAM: RIGHT ANKLE - 2 VIEW COMPARISON:  12/25/2017 FINDINGS: Osseous  mineralization normal. Mild soft tissue swelling at RIGHT ankle medially and laterally. Joint spaces preserved. No acute fracture, dislocation, or bone destruction. IMPRESSION: No acute osseous abnormalities. Electronically Signed   By: Ulyses SouthwardMark  Boles M.D.   On: 01/17/2018 16:41   Dg Foot Complete Right  Result Date: 01/17/2018 CLINICAL DATA:  Injury playing soccer three weeks ago, fell 2 days ago re-injuring RIGHT ankle, pain and swelling at lateral malleolus and at lateral tarsals EXAM: RIGHT FOOT COMPLETE - 3+ VIEW COMPARISON:  None FINDINGS: Osseous mineralization normal. Joint spaces preserved. No acute fracture, dislocation, or bone destruction. IMPRESSION: No acute osseous abnormalities. Electronically Signed   By: Ulyses SouthwardMark  Boles M.D.   On: 01/17/2018 16:42    Procedures Procedures (including critical care time)  Medications Ordered in ED Medications - No data to display   Initial Impression / Assessment and Plan / ED Course  I have reviewed the triage vital signs and the nursing notes.  Pertinent labs & imaging results that were available during my care of the patient were reviewed by me and considered in my medical decision making (see chart for details).     22 y.o. male with inversion ankle injury 2 days ago. Patient X-Ray negative for obvious fracture or dislocation.  No evidence of Achilles tendon rupture.  Patient appears to have an ankle sprain. Pt advised to follow up with orthopedics if symptoms persist for possibility of missed fracture diagnosis. Patient given brace while in ED., States he already has crutches at home. Conservative therapy recommended and discussed. Patient already has Mobic at home. Return precautions discussed. Will be dc home & is agreeable with above plan.  Final Clinical Impressions(s) / ED Diagnoses   Final diagnoses:  Sprain of right ankle, unspecified ligament, initial encounter    ED Discharge Orders    None       Jacinto HalimMaczis, Arav Bannister M,  Cordelia Poche-C 01/17/18 1730    Tegeler, Canary Brimhristopher J, MD 01/18/18 [phone]

## 2018-03-22 ENCOUNTER — Emergency Department
Admission: EM | Admit: 2018-03-22 | Discharge: 2018-03-22 | Disposition: A | Discharge status: Home / Self Care | Payer: Medicaid Other | Attending: Emergency Medicine | Admitting: Emergency Medicine

## 2018-03-22 ENCOUNTER — Other Ambulatory Visit: Payer: Self-pay

## 2018-03-22 ENCOUNTER — Encounter: Payer: Self-pay | Admitting: Emergency Medicine

## 2018-03-22 DIAGNOSIS — Z79899 Other long term (current) drug therapy: Secondary | ICD-10-CM | POA: Diagnosis not present

## 2018-03-22 DIAGNOSIS — L700 Acne vulgaris: Secondary | ICD-10-CM | POA: Insufficient documentation

## 2018-03-22 DIAGNOSIS — H5711 Ocular pain, right eye: Secondary | ICD-10-CM | POA: Diagnosis present

## 2018-03-22 MED ORDER — DESONIDE 0.05 % EX CREA: TOPICAL_CREAM | CUTANEOUS | 1 refills | Status: AC

## 2018-03-22 NOTE — ED Triage Notes (Signed)
Pt to ED via POV c/o right eye pain. Pt states that the pain started yesterday after using a spray at work. Pt states that the spray got in his eye. Pt in NAD at this time.

## 2018-03-22 NOTE — ED Notes (Signed)
See triage note  States he is having some swelling and irritation to right eye  States he uses a spray at work and thinks he had gotten some in his eye  States he wears safety goggles so not sure what happened

## 2018-03-22 NOTE — ED Provider Notes (Signed)
[facility] Emergency Department Provider Note ____________________________________________  Time seen: Approximately 7:46 AM  I have reviewed the triage vital signs and the nursing notes.   HISTORY  Chief Complaint Eye Pain   HPI Givanni Staron is a 22 y.o. male who presents to the emergency department for evaluation and treatment of tenderness to the right cheek/periorbital area.  Patient states that he wears safety goggles at work and it seems that every time he uses a certain rest spray that he develops a sore area under his eye.  He did not get any of the spray into his eyes and and does not have eye pain or irritation.  He denies blurred vision. History reviewed. No pertinent past medical history.  Patient Active Problem List   Diagnosis Date Noted  . Abdominal pain in male 04/14/2015    History reviewed. No pertinent surgical history.  Prior to Admission medications   Medication Sig Start Date End Date Taking? Authorizing Provider  desonide (DESOWEN) 0.05 % cream Apply to affected area 2 times daily 03/22/18 03/22/19  Kylee Nardozzi, Rulon Eisenmenger B, FNP  meloxicam (MOBIC) 15 MG tablet Take 1 tablet (15 mg total) by mouth daily. 12/25/17   Cuthriell, Delorise Royals, PA-C    Allergies Patient has no known allergies.  No family history on file.  Social History Social History   Tobacco Use  . Smoking status: Never Smoker  . Smokeless tobacco: Never Used  Substance Use Topics  . Alcohol use: No  . Drug use: No    Review of Systems   Constitutional: No fever/chills Eyes: Negative negative for visual changes.   Musculoskeletal: Negative for pain. Skin: Negative for rash.  Positive for pain under the right eye.  Positive for acne Neurological: Negative for headaches, focal weakness or numbness. Allergic: Negative for seasonal allergies. ____________________________________________  PHYSICAL EXAM:  VITAL SIGNS: ED Triage Vitals  Enc Vitals Group   BP 03/22/18 0720 132/65     Pulse Rate 03/22/18 0720 70     Resp 03/22/18 0720 16     Temp 03/22/18 0720 98 F (36.7 C)     Temp Source 03/22/18 0720 Oral     SpO2 03/22/18 0720 99 %     Weight 03/22/18 0721 125 lb (56.7 kg)     Height 03/22/18 0721 6' (1.829 m)     Head Circumference --      Peak Flow --      Pain Score 03/22/18 0720 7     Pain Loc --      Pain Edu? --      Excl. in GC? --     Constitutional: Alert and oriented. Well appearing and in no acute distress. Eyes: Visual acuity--see nursing documentation; no globe trauma; Eyelids normal to inspection; Sclera appears normal.  Eyelids Not inverted. Conjunctiva appears normal; Cornea normal. Head: Atraumatic. Nose: No congestion/rhinnorhea. Mouth/Throat: Mucous membranes are moist.  Oropharynx non-erythematous. Respiratory: Respirations even and unlabored. Musculoskeletal:Normal ROM x 4 extremities. Neurologic:  Normal speech and language. No gross focal neurologic deficits are appreciated. Speech is normal. No gait instability. Skin:  Skin is warm, dry and intact. No rash noted. Psychiatric: Mood and affect are normal. Speech and behavior are normal.  ____________________________________________   LABS (all labs ordered are listed, but only abnormal results are displayed)  Labs Reviewed - No data to display ____________________________________________  EKG  Not indicated. ____________________________________________  RADIOLOGY  Not indicated. ____________________________________________   PROCEDURES  Procedure(s) performed: None ____________________________________________   INITIAL  IMPRESSION / ASSESSMENT AND PLAN / ED COURSE  22 year old male who presents to the ER for evaluation of pain under the right eye. Symptoms are most consistent with inflammation secondary to acne in the area. No indication of cellulitis at this time. Desonide prescribed and patient was provided a work note for today. He was  advised to follow up with PCP or dermatology for symptoms of concern.   Pertinent labs & imaging results that were available during my care of the patient were reviewed by me and considered in my medical decision making (see chart for details). ____________________________________________   FINAL CLINICAL IMPRESSION(S) / ED DIAGNOSES  Final diagnoses:  Acne comedone    Note:  This document was prepared using Dragon voice recognition software and may include unintentional dictation errors.    Chinita Pester, FNP 03/22/18 1627    Emily Filbert, MD 03/23/18 1114

## 2019-02-28 ENCOUNTER — Other Ambulatory Visit: Payer: Self-pay

## 2019-02-28 ENCOUNTER — Emergency Department
Admission: EM | Admit: 2019-02-28 | Discharge: 2019-02-28 | Disposition: A | Discharge status: Home / Self Care | Payer: Medicaid Other | Attending: Emergency Medicine | Admitting: Emergency Medicine

## 2019-02-28 ENCOUNTER — Encounter: Payer: Self-pay | Admitting: Emergency Medicine

## 2019-02-28 DIAGNOSIS — Y939 Activity, unspecified: Secondary | ICD-10-CM | POA: Diagnosis not present

## 2019-02-28 DIAGNOSIS — Y92512 Supermarket, store or market as the place of occurrence of the external cause: Secondary | ICD-10-CM | POA: Diagnosis not present

## 2019-02-28 DIAGNOSIS — R0789 Other chest pain: Secondary | ICD-10-CM | POA: Diagnosis not present

## 2019-02-28 DIAGNOSIS — X500XXA Overexertion from strenuous movement or load, initial encounter: Secondary | ICD-10-CM | POA: Insufficient documentation

## 2019-02-28 DIAGNOSIS — Y990 Civilian activity done for income or pay: Secondary | ICD-10-CM | POA: Diagnosis not present

## 2019-02-28 DIAGNOSIS — S46911A Strain of unspecified muscle, fascia and tendon at shoulder and upper arm level, right arm, initial encounter: Secondary | ICD-10-CM

## 2019-02-28 DIAGNOSIS — S4991XA Unspecified injury of right shoulder and upper arm, initial encounter: Secondary | ICD-10-CM | POA: Diagnosis present

## 2019-02-28 MED ORDER — MELOXICAM 15 MG PO TABS: 15.0000 mg | ORAL_TABLET | Freq: Every day | ORAL | 0 refills | Status: DC

## 2019-02-28 MED ORDER — CYCLOBENZAPRINE HCL 10 MG PO TABS: 10.0000 mg | ORAL_TABLET | Freq: Three times a day (TID) | ORAL | 0 refills | Status: DC | PRN

## 2019-02-28 NOTE — ED Provider Notes (Signed)
[facility] ([place] Emergency Department Provider Note  ____________________________________________   First MD Initiated Contact with Patient 02/28/19 1431     (approximate)  I have reviewed the triage vital signs and the nursing notes.   HISTORY  Chief Complaint Shoulder Pain and Chest Pain    HPI Mario Davis is a 23 y.o. male presents emergency department complaining of right shoulder pain.  States pain is at the shoulder and radiates into the right upper chest.  States he has been working at [employer] doing a lot of lifting.  He states the pain started after works other night.  He has been taking over-the-counter medication without any relief.  He states he needs a work note for today.  He denies any numbness or tingling.  No chest pain or shortness of breath.    History reviewed. No pertinent past medical history.  Patient Active Problem List   Diagnosis Date Noted  . Abdominal pain in male 04/14/2015    History reviewed. No pertinent surgical history.  Prior to Admission medications   Medication Sig Start Date End Date Taking? Authorizing Provider  cyclobenzaprine (FLEXERIL) 10 MG tablet Take 1 tablet (10 mg total) by mouth 3 (three) times daily as needed. 02/28/19   Wess Baney, Roselyn BeringSusan W, PA-C  desonide (DESOWEN) 0.05 % cream Apply to affected area 2 times daily 03/22/18 03/22/19  Triplett, Rulon Eisenmengerari B, FNP  meloxicam (MOBIC) 15 MG tablet Take 1 tablet (15 mg total) by mouth daily. 02/28/19   Faythe GheeFisher, Peni Rupard W, PA-C    Allergies Patient has no known allergies.  History reviewed. No pertinent family history.  Social History Social History   Tobacco Use  . Smoking status: Never Smoker  . Smokeless tobacco: Never Used  Substance Use Topics  . Alcohol use: No  . Drug use: No    Review of Systems  Constitutional: No fever/chills Eyes: No visual changes. ENT: No sore throat. Respiratory: Denies cough Genitourinary: Negative for dysuria.  Musculoskeletal: Negative for back pain.  Positive for right shoulder pain Skin: Negative for rash.    ____________________________________________   PHYSICAL EXAM:  VITAL SIGNS: ED Triage Vitals  Enc Vitals Group     BP 02/28/19 1400 (!) 99/49     Pulse Rate 02/28/19 1400 63     Resp --      Temp 02/28/19 1400 98.4 F (36.9 C)     Temp Source 02/28/19 1400 Oral     SpO2 02/28/19 1400 100 %     Weight 02/28/19 1401 137 lb (62.1 kg)     Height 02/28/19 1401 6' (1.829 m)     Head Circumference --      Peak Flow --      Pain Score 02/28/19 1411 7     Pain Loc --      Pain Edu? --      Excl. in GC? --     Constitutional: Alert and oriented. Well appearing and in no acute distress. Eyes: Conjunctivae are normal.  Head: Atraumatic. Nose: No congestion/rhinnorhea. Mouth/Throat: Mucous membranes are moist.   Neck:  supple no lymphadenopathy noted Cardiovascular: Normal rate, regular rhythm. Heart sounds are normal Respiratory: Normal respiratory effort.  No retractions, lungs c t a  GU: deferred Musculoskeletal: Decreased range of motion of the right shoulder secondary to discomfort.  The trapezius muscle and  pec muscle are tender to palpation.  Patient able to move in all planes but has difficulty reaching overhead.  Neurovascular is intact.  No  bony tenderness is noted.   Neurologic:  Normal speech and language.  Skin:  Skin is warm, dry and intact. No rash noted. Psychiatric: Mood and affect are normal. Speech and behavior are normal.  ____________________________________________   LABS (all labs ordered are listed, but only abnormal results are displayed)  Labs Reviewed - No data to display ____________________________________________   ____________________________________________  RADIOLOGY    ____________________________________________   PROCEDURES  Procedure(s) performed: EKG shows normal sinus rhythm   Procedures     ____________________________________________   INITIAL IMPRESSION / ASSESSMENT AND PLAN / ED COURSE  Pertinent labs & imaging results that were available during my care of the patient were reviewed by me and considered in my medical decision making (see chart for details).   Patient is 23 year old male presents emergency department with right shoulder pain.  Physical exam shows the patient to have limited range of motion secondary discomfort.  Trapezius and pec muscles are spasmed and tender.  Remainder the exam is unremarkable  Explained the findings to the patient.  He states he needs a work note for today.  He was given a prescription for meloxicam and Flexeril.  He is to apply ice to the shoulder.  Follow-up with orthopedics if not better in 5 to 7 days.  Return if worsening.  States he understands will comply.  Is discharged in stable condition.     As part of my medical decision making, I reviewed the following data within the electronic MEDICAL RECORD NUMBER Nursing notes reviewed and incorporated, Old chart reviewed, Notes from prior ED visits and [place] Controlled Substance Database  ____________________________________________   FINAL CLINICAL IMPRESSION(S) / ED DIAGNOSES  Final diagnoses:  Strain of right shoulder, initial encounter      NEW MEDICATIONS STARTED DURING THIS VISIT:  New Prescriptions   CYCLOBENZAPRINE (FLEXERIL) 10 MG TABLET    Take 1 tablet (10 mg total) by mouth 3 (three) times daily as needed.     Note:  This document was prepared using Dragon voice recognition software and may include unintentional dictation errors.    Faythe Ghee, PA-C 02/28/19 1445    Emily Filbert, MD 02/28/19 1537

## 2019-02-28 NOTE — Discharge Instructions (Addendum)
Apply ice to the right shoulder for the next 3 to 4 days.  15 minutes 3 times daily.  Take medication as prescribed.  Trying to move your arm as frequently as you can.  Does not want the muscles to get stiff and have a frozen shoulder.  Follow-up with orthopedics if you are not better in 3 to 5 days.  Their phone number is attached.

## 2019-02-28 NOTE — ED Notes (Signed)
See triage note  States he woke up with pain to right anterior shoulder this am  States pain moves into right upper chest with movement  Denies any trauma

## 2019-02-28 NOTE — ED Triage Notes (Signed)
Pt to ER states he awoke early this AM with pain to the right shoulder that radiates into the right chest.  Pt states pain in shoulder and chest increase with movement. PT denies known injury, but states it could have happened at work.

## 2019-09-25 ENCOUNTER — Other Ambulatory Visit: Payer: Self-pay

## 2019-09-25 ENCOUNTER — Emergency Department (HOSPITAL_COMMUNITY)
Admission: EM | Admit: 2019-09-25 | Discharge: 2019-09-25 | Disposition: A | Discharge status: Home / Self Care | Payer: Medicaid Other | Attending: Emergency Medicine | Admitting: Emergency Medicine

## 2019-09-25 DIAGNOSIS — Z20828 Contact with and (suspected) exposure to other viral communicable diseases: Secondary | ICD-10-CM | POA: Insufficient documentation

## 2019-09-25 DIAGNOSIS — J029 Acute pharyngitis, unspecified: Secondary | ICD-10-CM

## 2019-09-25 DIAGNOSIS — M7918 Myalgia, other site: Secondary | ICD-10-CM | POA: Insufficient documentation

## 2019-09-25 DIAGNOSIS — B349 Viral infection, unspecified: Secondary | ICD-10-CM

## 2019-09-25 DIAGNOSIS — J069 Acute upper respiratory infection, unspecified: Secondary | ICD-10-CM | POA: Diagnosis not present

## 2019-09-25 DIAGNOSIS — R519 Headache, unspecified: Secondary | ICD-10-CM | POA: Insufficient documentation

## 2019-09-25 LAB — SARS CORONAVIRUS 2 (TAT 6-24 HRS): SARS Coronavirus 2: NEGATIVE

## 2019-09-25 LAB — GROUP A STREP BY PCR: Group A Strep by PCR: NOT DETECTED

## 2019-09-25 MED ORDER — LIDOCAINE VISCOUS HCL 2 % MT SOLN
15.0000 mL | Freq: Once | OROMUCOSAL | Status: AC
  Administered 2019-09-25: 15 mL via OROMUCOSAL
  Filled 2019-09-25: qty 15

## 2019-09-25 MED ORDER — ACETAMINOPHEN 500 MG PO TABS
1000.0000 mg | ORAL_TABLET | Freq: Once | ORAL | Status: AC
  Administered 2019-09-25: 1000 mg via ORAL
  Filled 2019-09-25: qty 2

## 2019-09-25 MED ORDER — IBUPROFEN 400 MG PO TABS
600.0000 mg | ORAL_TABLET | Freq: Once | ORAL | Status: AC
  Administered 2019-09-25: 600 mg via ORAL
  Filled 2019-09-25: qty 1

## 2019-09-25 NOTE — ED Notes (Signed)
Patient Alert and oriented to baseline. Stable and ambulatory to baseline. Patient verbalized understanding of the discharge instructions.  Patient belongings were taken by the patient.   

## 2019-09-25 NOTE — ED Provider Notes (Signed)
[facility] EMERGENCY DEPARTMENT Provider Note   CSN: [id number] Arrival date & time: 09/25/19  1441     History   Chief Complaint Chief Complaint  Patient presents with  . Sore Throat    HPI Mario Davis is a 23 y.o. male.     Mario Davis is a 23 y.o. male who is otherwise healthy, presents to the ED for evaluation of 4 days of sore throat.  He describes pain as a constant dull ache that is worse with swallowing, but he has been able to eat and drink.  He denies any associated fevers or chills.  No neck pain or swelling.  No associated rhinorrhea, nasal congestion, ear pain or cough.  No chest pain or shortness of breath.  No nausea, vomiting or diarrhea.  Patient does report that today he started to have some mild headaches.  He denies any known sick contacts.  He has not taken any medications to treat his symptoms prior to arrival.  No other aggravating or alleviating factors.     No past medical history on file.  Patient Active Problem List   Diagnosis Date Noted  . Abdominal pain in male 04/14/2015    No past surgical history on file.      Home Medications    Prior to Admission medications   Medication Sig Start Date End Date Taking? Authorizing Provider  cyclobenzaprine (FLEXERIL) 10 MG tablet Take 1 tablet (10 mg total) by mouth 3 (three) times daily as needed. 02/28/19   Fisher, Linden Dolin, PA-C  meloxicam (MOBIC) 15 MG tablet Take 1 tablet (15 mg total) by mouth daily. 02/28/19   Versie Starks, PA-C    Family History No family history on file.  Social History Social History   Tobacco Use  . Smoking status: Never Smoker  . Smokeless tobacco: Never Used  Substance Use Topics  . Alcohol use: No  . Drug use: No     Allergies   Patient has no known allergies.   Review of Systems Review of Systems  Constitutional: Negative for chills and fever.  HENT: Positive for sore throat. Negative for congestion,  rhinorrhea and trouble swallowing.   Eyes: Negative for visual disturbance.  Respiratory: Negative for cough and shortness of breath.   Cardiovascular: Negative for chest pain.  Gastrointestinal: Negative for abdominal pain, nausea and vomiting.  Musculoskeletal: Positive for myalgias. Negative for neck pain and neck stiffness.  Skin: Negative for color change and rash.  Neurological: Positive for headaches.     Physical Exam Updated Vital Signs BP 119/68 (BP Location: Left Arm)   Pulse 80   Temp 98.5 F (36.9 C) (Oral)   Resp 16   SpO2 97%   Physical Exam Vitals signs and nursing note reviewed.  Constitutional:      General: He is not in acute distress.    Appearance: He is well-developed. He is not ill-appearing or diaphoretic.     Comments: Well appearing and in no distress  HENT:     Head: Normocephalic and atraumatic.     Nose: No rhinorrhea.     Mouth/Throat:     Mouth: Mucous membranes are moist.     Pharynx: Uvula midline. Posterior oropharyngeal erythema present. No oropharyngeal exudate or uvula swelling.     Tonsils: No tonsillar exudate or tonsillar abscesses. 1+ on the right. 1+ on the left.     Comments: Posterior oropharynx clear and mucous membranes moist, there is mild erythema and  edema, no tonsillar exudates, uvula midline, normal phonation, no trismus, tolerating secretions without difficulty. Eyes:     General:        Right eye: No discharge.        Left eye: No discharge.  Neck:     Musculoskeletal: Neck supple.     Comments: No rigidity Cardiovascular:     Rate and Rhythm: Normal rate and regular rhythm.     Heart sounds: Normal heart sounds.  Pulmonary:     Effort: Pulmonary effort is normal. No respiratory distress.     Breath sounds: Normal breath sounds.     Comments: Respirations equal and unlabored, patient able to speak in full sentences, lungs clear to auscultation bilaterally Abdominal:     General: Bowel sounds are normal. There is no  distension.     Palpations: Abdomen is soft. There is no mass.     Tenderness: There is no abdominal tenderness. There is no guarding.     Comments: Abdomen soft, nondistended, nontender to palpation in all quadrants without guarding or peritoneal signs  Musculoskeletal:        General: No deformity.  Lymphadenopathy:     Cervical: No cervical adenopathy.  Skin:    General: Skin is warm and dry.     Capillary Refill: Capillary refill takes less than 2 seconds.  Neurological:     Mental Status: He is alert.      ED Treatments / Results  Labs (all labs ordered are listed, but only abnormal results are displayed) Labs Reviewed  GROUP A STREP BY PCR  SARS CORONAVIRUS 2 (TAT 6-24 HRS)    EKG None  Radiology No results found.  Procedures Procedures (including critical care time)  Medications Ordered in ED Medications  ibuprofen (ADVIL) tablet 600 mg (600 mg Oral Given 09/25/19 1822)  lidocaine (XYLOCAINE) 2 % viscous mouth solution 15 mL (15 mLs Mouth/Throat Given 09/25/19 1822)  acetaminophen (TYLENOL) tablet 1,000 mg (1,000 mg Oral Given 09/25/19 1822)     Initial Impression / Assessment and Plan / ED Course  I have reviewed the triage vital signs and the nursing notes.  Pertinent labs & imaging results that were available during my care of the patient were reviewed by me and considered in my medical decision making (see chart for details).  Pt afebrile without tonsillar exudate, negative strep. Presents with mild cervical lymphadenopathy, & dysphagia; diagnosis of viral pharyngitis. No abx indicated. COVID testing sent. Discharged with symptomatic tx for pain  Pt does not appear dehydrated, but did discuss importance of water rehydration. Presentation non concerning for PTA or RPA. No trismus or uvula deviation. Specific return precautions discussed. Pt able to drink water in ED without difficulty with intact air way. Recommended PCP follow up.  Mario Davis  was evaluated in Emergency Department on 09/28/2019 for the symptoms described in the history of present illness. He was evaluated in the context of the global COVID-19 pandemic, which necessitated consideration that the patient might be at risk for infection with the SARS-CoV-2 virus that causes COVID-19. Institutional protocols and algorithms that pertain to the evaluation of patients at risk for COVID-19 are in a state of rapid change based on information released by regulatory bodies including the CDC and federal and state organizations. These policies and algorithms were followed during the patient's care in the ED.   Final Clinical Impressions(s) / ED Diagnoses   Final diagnoses:  Sore throat  Viral illness    ED Discharge Orders  None       Legrand RamsFord, Tamaria Dunleavy N, PA-C 09/28/19 2125    Melene PlanFloyd, Dan, DO 09/28/19 2301

## 2019-09-25 NOTE — Discharge Instructions (Signed)
Your strep test is negative.  You do have a coronavirus test pending this takes about a day or 2 to come back.  You will be called by phone with any positive results.  Please quarantine at home until you get your results.  Continue to treat your symptoms supportively with ibuprofen 600 mg every 6 hours, Tylenol 1000 mg every 6 hours, and over-the-counter's epical throat lozenges.  Follow-up with your PCP.  Return to the ED for any new or worsening symptoms.  If your coronavirus test does come back positive you will need to quarantine at home for 14 days or until you have been symptom and fever free for at least 3 days.

## 2019-09-25 NOTE — ED Triage Notes (Signed)
Pt presents w/sore throat x4 days, began having a headache this afternoon

## 2021-01-19 ENCOUNTER — Ambulatory Visit (INDEPENDENT_AMBULATORY_CARE_PROVIDER_SITE_OTHER): Payer: Medicaid Other | Admitting: Registered Nurse

## 2021-01-19 ENCOUNTER — Other Ambulatory Visit: Payer: Self-pay

## 2021-01-19 ENCOUNTER — Encounter: Payer: Self-pay | Admitting: Registered Nurse

## 2021-01-19 VITALS — BP 113/74 | HR 97 | Temp 97.6°F | Resp 18 | Ht 72.0 in | Wt 168.0 lb

## 2021-01-19 DIAGNOSIS — M940 Chondrocostal junction syndrome [Tietze]: Secondary | ICD-10-CM | POA: Diagnosis not present

## 2021-01-19 DIAGNOSIS — R079 Chest pain, unspecified: Secondary | ICD-10-CM | POA: Diagnosis not present

## 2021-01-19 DIAGNOSIS — Z7689 Persons encountering health services in other specified circumstances: Secondary | ICD-10-CM

## 2021-01-19 DIAGNOSIS — Z0000 Encounter for general adult medical examination without abnormal findings: Secondary | ICD-10-CM

## 2021-01-19 DIAGNOSIS — Z0001 Encounter for general adult medical examination with abnormal findings: Secondary | ICD-10-CM | POA: Diagnosis not present

## 2021-01-19 MED ORDER — DICLOFENAC SODIUM 75 MG PO TBEC: 75.0000 mg | DELAYED_RELEASE_TABLET | Freq: Two times a day (BID) | ORAL | 0 refills | Status: DC

## 2021-01-19 NOTE — Patient Instructions (Signed)
° ° ° °  If you have lab work done today you will be contacted with your lab results within the next 2 weeks.  If you have not heard from us then please contact us. The fastest way to get your results is to register for My Chart. ° ° °IF you received an x-ray today, you will receive an invoice from [facility]. Please contact [facility] at [phone] with questions or concerns regarding your invoice.  ° °IF you received labwork today, you will receive an invoice from [facility]. Please contact [facility] at [phone] with questions or concerns regarding your invoice.  ° °Our billing staff will not be able to assist you with questions regarding bills from these companies. ° °You will be contacted with the lab results as soon as they are available. The fastest way to get your results is to activate your My Chart account. Instructions are located on the last page of this paperwork. If you have not heard from us regarding the results in 2 weeks, please contact this office. °  ° ° ° °

## 2021-01-19 NOTE — Progress Notes (Signed)
New Patient Office Visit  Subjective:  Patient ID: Mario Davis, male    DOB: 03-13-1996  Age: 25 y.o. MRN: [id number]  CC:  Chief Complaint  Patient presents with  . New Patient (Initial Visit)    Patient states he is here to establish care and get an physical. Patient biggest concern is that he has been experiencing SOB and chest pain for 2 months     HPI Mario Davis presents for visit to est care, cpe, and chest pain  Histories reviewed, updated as warranted Very active - exercises routinely with sports. Estate agent for work, started new job 2 weeks ago Lives with fiancee, has lived in same place x 2 years, has dogs, has always had them growing up  Chest pain onset 2 months ago. Wakes up at night with crushing chest pain and shob Lasts 30-45 seconds, resolves. Aches throughout the day for the next few days before resolution. Unsure if he snores. Reports that he does not get restful sleep - never has. Wakes with headaches, daytime fatigue. Younger brother snores as well. Unsure if parents snore or have apnea  Has been an athlete all his life - basketball, baseball, soccer - no issues with CV or pulmonary function through these. Denies palpitations, LOC, shob, asthma, or other concerns when active. No clear triggering factors.  Does note that these episodes seem to happen after very vivid dreams, or dreams in which he experiences deja vu. Denies any consistent anxiety or depression, firmly denies HI/SI  History reviewed. No pertinent past medical history.  History reviewed. No pertinent surgical history.  Family History  Problem Relation Age of Onset  . Hypertension Father     Social History   Socioeconomic History  . Marital status: Significant Other    Spouse name: Not on file  . Number of children: 0  . Years of education: Not on file  . Highest education level: Not on file  Occupational History  . Not on file  Tobacco Use  . Smoking  status: Never Smoker  . Smokeless tobacco: Never Used  Vaping Use  . Vaping Use: Never used  Substance and Sexual Activity  . Alcohol use: No  . Drug use: No  . Sexual activity: Not on file  Other Topics Concern  . Not on file  Social History Narrative  . Not on file   Social Determinants of Health   Financial Resource Strain: Not on file  Food Insecurity: Not on file  Transportation Needs: Not on file  Physical Activity: Not on file  Stress: Not on file  Social Connections: Not on file  Intimate Partner Violence: Not on file    ROS Review of Systems  Constitutional: Negative.   HENT: Negative.   Eyes: Negative.   Respiratory: Positive for chest tightness and shortness of breath. Negative for apnea, cough, choking, wheezing and stridor.   Cardiovascular: Positive for chest pain. Negative for palpitations and leg swelling.  Gastrointestinal: Negative.   Endocrine: Negative.   Genitourinary: Negative.   Musculoskeletal: Negative.   Skin: Negative.   Allergic/Immunologic: Negative.   Neurological: Negative.   Psychiatric/Behavioral: Negative.   All other systems reviewed and are negative.   Objective:   Today's Vitals: BP 113/74   Pulse 97   Temp 97.6 F (36.4 C) (Temporal)   Resp 18   Ht 6' (1.829 m)   Wt 168 lb (76.2 kg)   SpO2 98%   BMI 22.78 kg/m   Physical Exam Vitals  and nursing note reviewed.  Constitutional:      General: He is not in acute distress.    Appearance: Normal appearance. He is obese. He is not ill-appearing, toxic-appearing or diaphoretic.  HENT:     Head: Normocephalic and atraumatic.     Right Ear: Tympanic membrane, ear canal and external ear normal. There is no impacted cerumen.     Left Ear: Tympanic membrane, ear canal and external ear normal. There is no impacted cerumen.     Nose: Nose normal. No congestion or rhinorrhea.     Mouth/Throat:     Mouth: Mucous membranes are moist.     Pharynx: Oropharynx is clear. No  oropharyngeal exudate or posterior oropharyngeal erythema.  Eyes:     General: No scleral icterus.       Right eye: No discharge.        Left eye: No discharge.     Extraocular Movements: Extraocular movements intact.     Conjunctiva/sclera: Conjunctivae normal.     Pupils: Pupils are equal, round, and reactive to light.  Neck:     Vascular: No carotid bruit.  Cardiovascular:     Rate and Rhythm: Normal rate and regular rhythm.     Pulses: Normal pulses.     Heart sounds: Normal heart sounds. No murmur heard. No friction rub. No gallop.   Pulmonary:     Effort: Pulmonary effort is normal. No respiratory distress.     Breath sounds: No stridor. Wheezing (mild throughout, not consistent with his stated symptoms.) present. No rhonchi or rales.  Chest:     Chest wall: No tenderness.  Abdominal:     General: Abdomen is flat. Bowel sounds are normal. There is no distension.     Palpations: Abdomen is soft. There is no mass.     Tenderness: There is no abdominal tenderness. There is no right CVA tenderness, left CVA tenderness, guarding or rebound.     Hernia: No hernia is present.  Musculoskeletal:        General: No swelling, tenderness, deformity or signs of injury. Normal range of motion.     Cervical back: Normal range of motion and neck supple. No rigidity or tenderness.     Right lower leg: No edema.     Left lower leg: No edema.  Lymphadenopathy:     Cervical: No cervical adenopathy.  Skin:    General: Skin is warm and dry.     Capillary Refill: Capillary refill takes less than 2 seconds.     Coloration: Skin is not jaundiced or pale.     Findings: No bruising, erythema, lesion or rash.  Neurological:     General: No focal deficit present.     Mental Status: He is alert and oriented to person, place, and time. Mental status is at baseline.     Cranial Nerves: No cranial nerve deficit.     Sensory: No sensory deficit.     Motor: No weakness.     Coordination: Coordination  normal.     Gait: Gait normal.     Deep Tendon Reflexes: Reflexes normal.  Psychiatric:        Mood and Affect: Mood normal.        Behavior: Behavior normal.        Thought Content: Thought content normal.        Judgment: Judgment normal.     Assessment & Plan:   Problem List Items Addressed This Visit   None   Visit  Diagnoses    Chest pain, unspecified type    -  Primary   Relevant Orders   EKG 12-Lead   CBC   TSH      Outpatient Encounter Medications as of 01/19/2021  Medication Sig  . [DISCONTINUED] cyclobenzaprine (FLEXERIL) 10 MG tablet Take 1 tablet (10 mg total) by mouth 3 (three) times daily as needed. (Patient not taking: Reported on 01/19/2021)  . [DISCONTINUED] meloxicam (MOBIC) 15 MG tablet Take 1 tablet (15 mg total) by mouth daily. (Patient not taking: Reported on 01/19/2021)   No facility-administered encounter medications on file as of 01/19/2021.    Follow-up: No follow-ups on file.   PLAN  EKG: compared to 03/01/2019, which showed normal sinus rhythm. Today shows normal sinus rhythm, no changes. No acute or chronic concerns.  Drawing cbc and tsh  Considering wide differentials which include but are not limited to GERD, sleep apnea, hyperthyroid, night terrors, costochondritis, trapezius strain/pectoral tightness.   Will try short course of dicloenac 75mg  PO bid prn. If no relief can consider lab results for perhaps a montelukast or albuterol for respiratory etiology, or consider prazosin for nightmares, or revisit symptoms to establish clearer pattern.  Patient encouraged to call clinic with any questions, comments, or concerns.   , NP

## 2021-01-20 ENCOUNTER — Encounter: Payer: Self-pay | Admitting: Registered Nurse

## 2021-01-20 ENCOUNTER — Telehealth: Payer: Self-pay | Admitting: Registered Nurse

## 2021-01-20 LAB — CBC
Hematocrit: 45.9 % (ref 37.5–51.0)
Hemoglobin: 15.6 g/dL (ref 13.0–17.7)
MCH: 30.8 pg (ref 26.6–33.0)
MCHC: 34 g/dL (ref 31.5–35.7)
MCV: 91 fL (ref 79–97)
Platelets: 291 10*3/uL (ref 150–450)
RBC: 5.06 x10E6/uL (ref 4.14–5.80)
RDW: 13.6 % (ref 11.6–15.4)
WBC: 6.6 10*3/uL (ref 3.4–10.8)

## 2021-01-20 LAB — TSH: TSH: 0.775 u[IU]/mL (ref 0.450–4.500)

## 2021-01-20 NOTE — Telephone Encounter (Signed)
Chanetta Marshall calling from [facility] plan  Regarding a Prior auth for diclofenac (VOLTAREN) 75 MG EC tablet  They are needing to know what other medications the patient has previously tried . Because it is not a preferrd medication on [place] formulary     Please call [phone] case #[id number]

## 2021-01-20 NOTE — Telephone Encounter (Signed)
Raynelle Fanning have you been working on a PA for this patient and this medication if not let me know and I will speak to [employer]

## 2021-01-20 NOTE — Telephone Encounter (Signed)
error 

## 2021-01-21 ENCOUNTER — Telehealth: Payer: Self-pay | Admitting: *Deleted

## 2021-01-21 ENCOUNTER — Encounter: Payer: Self-pay | Admitting: Registered Nurse

## 2021-01-21 ENCOUNTER — Other Ambulatory Visit: Payer: Self-pay | Admitting: Registered Nurse

## 2021-01-21 DIAGNOSIS — M940 Chondrocostal junction syndrome [Tietze]: Secondary | ICD-10-CM

## 2021-01-21 MED ORDER — MELOXICAM 7.5 MG PO TABS: 7.5000 mg | ORAL_TABLET | Freq: Every day | ORAL | 0 refills | Status: DC

## 2021-01-21 NOTE — Telephone Encounter (Signed)
Rich ,   They have denied the dicofenac, stated he needs to try Celecoxib first.

## 2021-01-21 NOTE — Telephone Encounter (Signed)
Message was sent to rich with the denial and the alternatives

## 2021-05-26 ENCOUNTER — Encounter (HOSPITAL_COMMUNITY): Payer: Self-pay

## 2021-05-26 ENCOUNTER — Emergency Department (HOSPITAL_COMMUNITY): Payer: Medicaid Other

## 2021-05-26 ENCOUNTER — Inpatient Hospital Stay (HOSPITAL_COMMUNITY)
Admission: EM | Admit: 2021-05-26 | Discharge: 2021-05-28 | DRG: 101 | Disposition: A | Discharge status: Home / Self Care | Payer: Medicaid Other | Attending: Internal Medicine | Admitting: Internal Medicine

## 2021-05-26 ENCOUNTER — Other Ambulatory Visit: Payer: Self-pay

## 2021-05-26 ENCOUNTER — Observation Stay (HOSPITAL_COMMUNITY): Payer: Medicaid Other

## 2021-05-26 DIAGNOSIS — R509 Fever, unspecified: Secondary | ICD-10-CM | POA: Diagnosis not present

## 2021-05-26 DIAGNOSIS — G039 Meningitis, unspecified: Secondary | ICD-10-CM | POA: Diagnosis present

## 2021-05-26 DIAGNOSIS — D72829 Elevated white blood cell count, unspecified: Secondary | ICD-10-CM | POA: Diagnosis present

## 2021-05-26 DIAGNOSIS — Z20822 Contact with and (suspected) exposure to covid-19: Secondary | ICD-10-CM | POA: Diagnosis present

## 2021-05-26 DIAGNOSIS — R519 Headache, unspecified: Secondary | ICD-10-CM

## 2021-05-26 DIAGNOSIS — Z820 Family history of epilepsy and other diseases of the nervous system: Secondary | ICD-10-CM

## 2021-05-26 DIAGNOSIS — R569 Unspecified convulsions: Principal | ICD-10-CM

## 2021-05-26 DIAGNOSIS — N179 Acute kidney failure, unspecified: Secondary | ICD-10-CM

## 2021-05-26 DIAGNOSIS — E876 Hypokalemia: Secondary | ICD-10-CM | POA: Diagnosis present

## 2021-05-26 DIAGNOSIS — E860 Dehydration: Secondary | ICD-10-CM | POA: Diagnosis present

## 2021-05-26 DIAGNOSIS — Z791 Long term (current) use of non-steroidal anti-inflammatories (NSAID): Secondary | ICD-10-CM

## 2021-05-26 DIAGNOSIS — M436 Torticollis: Secondary | ICD-10-CM | POA: Diagnosis present

## 2021-05-26 DIAGNOSIS — R9389 Abnormal findings on diagnostic imaging of other specified body structures: Secondary | ICD-10-CM

## 2021-05-26 LAB — APTT: aPTT: 29 seconds (ref 24–36)

## 2021-05-26 LAB — COMPREHENSIVE METABOLIC PANEL
ALT: 62 U/L — ABNORMAL HIGH (ref 0–44)
AST: 49 U/L — ABNORMAL HIGH (ref 15–41)
Albumin: 4.3 g/dL (ref 3.5–5.0)
Alkaline Phosphatase: 100 U/L (ref 38–126)
Anion gap: 10 (ref 5–15)
BUN: 18 mg/dL (ref 6–20)
CO2: 25 mmol/L (ref 22–32)
Calcium: 9.3 mg/dL (ref 8.9–10.3)
Chloride: 100 mmol/L (ref 98–111)
Creatinine, Ser: 1.21 mg/dL (ref 0.61–1.24)
GFR, Estimated: >60 mL/min (ref >60)
Glucose, Bld: 108 mg/dL — ABNORMAL HIGH (ref 70–99)
Potassium: 3.4 mmol/L — ABNORMAL LOW (ref 3.5–5.1)
Sodium: 135 mmol/L (ref 135–145)
Total Bilirubin: 0.7 mg/dL (ref 0.3–1.2)
Total Protein: 8.5 g/dL — ABNORMAL HIGH (ref 6.5–8.1)

## 2021-05-26 LAB — CBC WITH DIFFERENTIAL/PLATELET
Abs Immature Granulocytes: 0.11 10*3/uL — ABNORMAL HIGH (ref 0.00–0.07)
Basophils Absolute: 0 10*3/uL (ref 0.0–0.1)
Basophils Relative: 0 %
Eosinophils Absolute: 0 10*3/uL (ref 0.0–0.5)
Eosinophils Relative: 0 %
HCT: 42.2 % (ref 39.0–52.0)
Hemoglobin: 14.6 g/dL (ref 13.0–17.0)
Immature Granulocytes: 1 %
Lymphocytes Relative: 7 %
Lymphs Abs: 1.1 10*3/uL (ref 0.7–4.0)
MCH: 30.4 pg (ref 26.0–34.0)
MCHC: 34.6 g/dL (ref 30.0–36.0)
MCV: 87.9 fL (ref 80.0–100.0)
Monocytes Absolute: 1.3 10*3/uL — ABNORMAL HIGH (ref 0.1–1.0)
Monocytes Relative: 8 %
Neutro Abs: 13.2 10*3/uL — ABNORMAL HIGH (ref 1.7–7.7)
Neutrophils Relative %: 84 %
Platelets: 260 10*3/uL (ref 150–400)
RBC: 4.8 MIL/uL (ref 4.22–5.81)
RDW: 12.9 % (ref 11.5–15.5)
WBC: 15.8 10*3/uL — ABNORMAL HIGH (ref 4.0–10.5)
nRBC: 0 % (ref 0.0–0.2)

## 2021-05-26 LAB — URINALYSIS, ROUTINE W REFLEX MICROSCOPIC
Bilirubin Urine: NEGATIVE
Glucose, UA: NEGATIVE mg/dL
Hgb urine dipstick: NEGATIVE
Ketones, ur: NEGATIVE mg/dL
Leukocytes,Ua: NEGATIVE
Nitrite: NEGATIVE
Protein, ur: NEGATIVE mg/dL
Specific Gravity, Urine: 1.001 — ABNORMAL LOW (ref 1.005–1.030)
pH: 6 (ref 5.0–8.0)

## 2021-05-26 LAB — RAPID URINE DRUG SCREEN, HOSP PERFORMED
Amphetamines: NOT DETECTED
Barbiturates: NOT DETECTED
Benzodiazepines: POSITIVE — AB
Cocaine: NOT DETECTED
Opiates: NOT DETECTED
Tetrahydrocannabinol: NOT DETECTED

## 2021-05-26 LAB — HIV ANTIBODY (ROUTINE TESTING W REFLEX): HIV Screen 4th Generation wRfx: NONREACTIVE

## 2021-05-26 LAB — RESP PANEL BY RT-PCR (FLU A&B, COVID) ARPGX2
Influenza A by PCR: NEGATIVE
Influenza B by PCR: NEGATIVE
SARS Coronavirus 2 by RT PCR: NEGATIVE

## 2021-05-26 LAB — LACTIC ACID, PLASMA: Lactic Acid, Venous: 1.1 mmol/L (ref 0.5–1.9)

## 2021-05-26 LAB — CK: Total CK: 250 U/L (ref 49–397)

## 2021-05-26 LAB — PHOSPHORUS: Phosphorus: 3.4 mg/dL (ref 2.5–4.6)

## 2021-05-26 LAB — PROTIME-INR
INR: 1 (ref 0.8–1.2)
Prothrombin Time: 13.4 seconds (ref 11.4–15.2)

## 2021-05-26 LAB — CBG MONITORING, ED: Glucose-Capillary: 128 mg/dL — ABNORMAL HIGH (ref 70–99)

## 2021-05-26 LAB — MAGNESIUM: Magnesium: 2.6 mg/dL — ABNORMAL HIGH (ref 1.7–2.4)

## 2021-05-26 MED ORDER — SODIUM CHLORIDE 0.9 % IV SOLN
100.0000 mg | Freq: Two times a day (BID) | INTRAVENOUS | Status: DC
  Administered 2021-05-27 (×2): 100 mg via INTRAVENOUS
  Filled 2021-05-26 (×3): qty 100

## 2021-05-26 MED ORDER — VANCOMYCIN HCL 1500 MG/300ML IV SOLN
1500.0000 mg | Freq: Once | INTRAVENOUS | Status: AC
  Administered 2021-05-26: 1500 mg via INTRAVENOUS
  Filled 2021-05-26: qty 300

## 2021-05-26 MED ORDER — SODIUM CHLORIDE 0.9 % IV SOLN
75.0000 mL/h | INTRAVENOUS | Status: DC
  Administered 2021-05-27: 75 mL/h via INTRAVENOUS

## 2021-05-26 MED ORDER — SODIUM CHLORIDE 0.9 % IV SOLN: INTRAVENOUS | Status: DC

## 2021-05-26 MED ORDER — DEXAMETHASONE SODIUM PHOSPHATE 10 MG/ML IJ SOLN
10.0000 mg | Freq: Once | INTRAMUSCULAR | Status: AC
  Administered 2021-05-26: 10 mg via INTRAVENOUS
  Filled 2021-05-26: qty 1

## 2021-05-26 MED ORDER — LEVETIRACETAM IN NACL 500 MG/100ML IV SOLN
500.0000 mg | Freq: Two times a day (BID) | INTRAVENOUS | Status: DC
  Administered 2021-05-27: 500 mg via INTRAVENOUS
  Filled 2021-05-26: qty 100

## 2021-05-26 MED ORDER — SODIUM CHLORIDE 0.9 % IV BOLUS
1000.0000 mL | Freq: Once | INTRAVENOUS | Status: AC
  Administered 2021-05-26: 1000 mL via INTRAVENOUS

## 2021-05-26 MED ORDER — VANCOMYCIN HCL IN DEXTROSE 1-5 GM/200ML-% IV SOLN: 1000.0000 mg | Freq: Once | INTRAVENOUS | Status: DC

## 2021-05-26 MED ORDER — POTASSIUM CHLORIDE 10 MEQ/100ML IV SOLN
10.0000 meq | INTRAVENOUS | Status: AC
  Administered 2021-05-26: 10 meq via INTRAVENOUS
  Filled 2021-05-26: qty 100

## 2021-05-26 MED ORDER — DEXTROSE 5 % IV SOLN
10.0000 mg/kg | Freq: Three times a day (TID) | INTRAVENOUS | Status: DC
  Administered 2021-05-27: 770 mg via INTRAVENOUS
  Filled 2021-05-26 (×2): qty 15.4

## 2021-05-26 MED ORDER — VANCOMYCIN HCL 1500 MG/300ML IV SOLN
1500.0000 mg | Freq: Two times a day (BID) | INTRAVENOUS | Status: DC
  Administered 2021-05-27 (×2): 1500 mg via INTRAVENOUS
  Filled 2021-05-26 (×2): qty 300

## 2021-05-26 MED ORDER — LIDOCAINE HCL (PF) 1 % IJ SOLN
INTRAMUSCULAR | Status: AC
  Administered 2021-05-26: 30 mL via INTRADERMAL
  Filled 2021-05-26: qty 30

## 2021-05-26 MED ORDER — SODIUM CHLORIDE 0.9 % IV SOLN
2000.0000 mg | Freq: Once | INTRAVENOUS | Status: AC
  Administered 2021-05-27: 2000 mg via INTRAVENOUS
  Filled 2021-05-26 (×2): qty 20

## 2021-05-26 MED ORDER — GADOBUTROL 1 MMOL/ML IV SOLN
8.0000 mL | Freq: Once | INTRAVENOUS | Status: AC | PRN
  Administered 2021-05-26: 8 mL via INTRAVENOUS

## 2021-05-26 MED ORDER — SODIUM CHLORIDE 0.9 % IV SOLN
2.0000 g | Freq: Once | INTRAVENOUS | Status: AC
  Administered 2021-05-26: 2 g via INTRAVENOUS
  Filled 2021-05-26: qty 20

## 2021-05-26 MED ORDER — ACETAMINOPHEN 325 MG PO TABS
650.0000 mg | ORAL_TABLET | Freq: Once | ORAL | Status: AC
  Administered 2021-05-26: 650 mg via ORAL
  Filled 2021-05-26: qty 2

## 2021-05-26 MED ORDER — LIDOCAINE HCL (PF) 1 % IJ SOLN: 30.0000 mL | Freq: Once | INTRAMUSCULAR | Status: DC

## 2021-05-26 MED ORDER — POTASSIUM CHLORIDE 10 MEQ/100ML IV SOLN
10.0000 meq | Freq: Once | INTRAVENOUS | Status: AC
  Administered 2021-05-26: 10 meq via INTRAVENOUS
  Filled 2021-05-26: qty 100

## 2021-05-26 MED ORDER — DEXTROSE 5 % IV SOLN
10.0000 mg/kg | Freq: Once | INTRAVENOUS | Status: AC
  Administered 2021-05-26: 770 mg via INTRAVENOUS
  Filled 2021-05-26: qty 15.4

## 2021-05-26 NOTE — ED Notes (Signed)
Seizure pads applied to stretcher by CNA

## 2021-05-26 NOTE — Progress Notes (Signed)
Pharmacy Antibiotic Note  Mario Davis is a 25 y.o. male admitted on 05/26/2021 with seizure like activity.  Pharmacy has been consulted for vancomycin and acycolvir for meningitis  Plan: Vancomycin 1500mg  IV q12h (AUC 599.7, Cmin 15.9 Scr 1.2, TBW) Acyclovir 10mg /kg (770mg ) q8h Follow renal function and clinical course  Height: 6' (182.9 cm) Weight: 77 kg (169 lb 12.1 oz) IBW/kg (Calculated) : 77.6  Temp (24hrs), Avg:99.2 F (37.3 C), Min:97.7 F (36.5 C), Max:100.6 F (38.1 C)  Recent Labs  Lab 05/26/21 1624  WBC 15.8*  CREATININE 1.21  LATICACIDVEN 1.1    Estimated Creatinine Clearance: 103.4 mL/min (by C-G formula based on SCr of 1.21 mg/dL).    No Known Allergies  Antimicrobials this admission 7/12 vanc >> 7/12 acyclovir >> 7/12 CTX x1 7/13 doxy>>   Dose adjustments this admission:   Microbiology results: 7/12 BCx:  7/12 UCx:  7/12 CSF: 7/12 MRSA PCR:  Thank you for allowing pharmacy to be a part of this patient's care.  9/12 RPh 05/26/2021, 11:39 PM

## 2021-05-26 NOTE — H&P (Signed)
Mario Davis QQP:[id number] DOB: [birth year] DOA: 05/26/2021     PCP: Janeece Agee, NP   Outpatient Specialists:   NONE    Patient arrived to ER on 05/26/21 at 1351 Referred by Attending Therisa Doyne, MD    Patient coming from: home Lives With family    Chief Complaint:   Chief Complaint  Patient presents with   Seizures    HPI: Mario Davis is a 25 y.o. male with no medical history        Presented with seizure-like activity that happened today.  Patient yesterday had a fever according to his family.  Today he was laying down in bed around noon his fiance noted that his face was drooped to on the left side he was unresponsive and then first became stiff all over again started to have rhythmic motion, possibly tonic-clonic seizure.  No tongue biting family unsure if he lost urine.  No prior seizures before. Once presented to emergency department patient was confused and combative got Ativan.  Now mentation has improved he did have a low-grade fever in the emergency department  Patient drinks only occasionally not a heavy drinker does not smoke No tick bites exposures.  Although initially he did report some neck pain currently able to move his neck all around without any pain or discomfort.  Denies any headache No sick contacts Infectious risk factors:  Reports fever,       Has   been vaccinated against COVID and boosted   Initial COVID TEST  NEGATIVE   Lab Results  Component Value Date   SARSCOV2NAA NEGATIVE 05/26/2021   SARSCOV2NAA NEGATIVE 09/25/2019       While in ER: CT head unremarkable LP ordered but was unable to be performed. LP under fluoroscopy ordered meanwhile he was started for potential meningitis coverage.  Rocephin will think and given a dose of steroids also made on acyclovir was added  I discussed case with neuro GI who recommended loading with Keppra They will see in consult MRI of the brain pending    ED Triage  Vitals  Enc Vitals Group     BP 05/26/21 1400 (!) 98/53     Pulse Rate 05/26/21 1400 (!) 125     Resp 05/26/21 1400 18     Temp 05/26/21 1400 (!) 100.6 F (38.1 C)     Temp src --      SpO2 05/26/21 1400 92 %     Weight 05/26/21 1409 169 lb 12.1 oz (77 kg)     Height 05/26/21 1409 6' (1.829 m)     Head Circumference --      Peak Flow --      Pain Score 05/26/21 1409 0     Pain Loc --      Pain Edu? --      Excl. in GC? --   TMAX(24)@     _________________________________________ Significant initial  Findings: Abnormal Labs Reviewed  COMPREHENSIVE METABOLIC PANEL - Abnormal; Notable for the following components:      Result Value   Potassium 3.4 (*)    Glucose, Bld 108 (*)    Total Protein 8.5 (*)    AST 49 (*)    ALT 62 (*)    All other components within normal limits  CBC WITH DIFFERENTIAL/PLATELET - Abnormal; Notable for the following components:   WBC 15.8 (*)    Neutro Abs 13.2 (*)    Monocytes Absolute 1.3 (*)    Abs Immature  Granulocytes 0.11 (*)    All other components within normal limits  CBG MONITORING, ED - Abnormal; Notable for the following components:   Glucose-Capillary 128 (*)    All other components within normal limits   ____________________________________________ Ordered CT HEAD   NON acute mild sinus disease  CXR -mild bibasilar lactacidosis could be atelectasis versus aspiration pneumonia     ECG: Ordered Personally reviewed by me showing: HR : 94 Rhythm:  NSR,     no evidence of ischemic changes QTC 430    The recent clinical data is shown below. Vitals:   05/26/21 1518 05/26/21 1530 05/26/21 1600 05/26/21 1730  BP: 109/64 115/64 (!) 117/93 133/77  Pulse: 77 75 94 (!) 104  Resp: 18   18  Temp:      SpO2: 97% 94% 94% 98%  Weight:      Height:        WBC     Component Value Date/Time   WBC 15.8 (H) 05/26/2021 1624   LYMPHSABS 1.1 05/26/2021 1624   MONOABS 1.3 (H) 05/26/2021 1624   EOSABS 0.0 05/26/2021 1624   BASOSABS 0.0  05/26/2021 1624     Lactic Acid, Venous    Component Value Date/Time   LATICACIDVEN 1.1 05/26/2021 1624     Procalcitonin  Ordered       UA  no evidence of UTI      Urine analysis:    Component Value Date/Time   COLORURINE COLORLESS (A) 05/26/2021 1830   APPEARANCEUR CLEAR 05/26/2021 1830   LABSPEC 1.001 (L) 05/26/2021 1830   PHURINE 6.0 05/26/2021 1830   GLUCOSEU NEGATIVE 05/26/2021 1830   HGBUR NEGATIVE 05/26/2021 1830   BILIRUBINUR NEGATIVE 05/26/2021 1830   KETONESUR NEGATIVE 05/26/2021 1830   PROTEINUR NEGATIVE 05/26/2021 1830   UROBILINOGEN 1.0 04/14/2015 1445   NITRITE NEGATIVE 05/26/2021 1830   LEUKOCYTESUR NEGATIVE 05/26/2021 1830    Results for orders placed or performed during the hospital encounter of 05/26/21  Resp Panel by RT-PCR (Flu A&B, Covid) Nasopharyngeal Swab     Status: None   Collection Time: 05/26/21  3:48 PM   Specimen: Nasopharyngeal Swab; Nasopharyngeal(NP) swabs in vial transport medium  Result Value Ref Range Status   SARS Coronavirus 2 by RT PCR NEGATIVE NEGATIVE Final         Influenza A by PCR NEGATIVE NEGATIVE Final   Influenza B by PCR NEGATIVE NEGATIVE Final            _______________________________________________ Hospitalist was called for admission for febrile seizure and sepsis  The following Work up has been ordered so far:  Orders Placed This Encounter  Procedures   Resp Panel by RT-PCR (Flu A&B, Covid) Nasopharyngeal Swab   Blood Culture (routine x 2)   Urine culture   CSF culture w Gram Stain   DG Chest Port 1 View   CT Head Wo Contrast   DG FLUORO GUIDE LUMBAR PUNCTURE   Lactic acid, plasma   Comprehensive metabolic panel   CBC WITH DIFFERENTIAL   Protime-INR   APTT   Urinalysis, Routine w reflex microscopic   Rapid urine drug screen (hospital performed)   Protein and glucose, CSF   HSV 1/2 PCR, CSF Cerebrospinal Fluid   Cryptococcal antigen, CSF   CSF cell count with differential collection tube #: 4    CSF cell count with differential collection tube #: 1   HIV Antibody (routine testing w rflx)   Diet Heart Room service appropriate? Yes; Fluid consistency: Thin  Cardiac monitoring   Document height and weight   Assess and Document Glasgow Coma Scale   Document vital signs within 1-hour of fluid bolus completion. Notify provider of abnormal vital signs despite fluid resuscitation.   DO NOT delay antibiotics if unable to obtain blood culture.   Refer to Sidebar Report: Sepsis Sidebar ED/IP   Notify provider for difficulties obtaining IV access.   Insert peripheral IV x 2   Initiate Carrier Fluid Protocol   Lumbar puncture tray to bedside   Lumbar Puncture: Order Tray and Sterile Gloves   Informed Consent Details: Physician/Practitioner Attestation; Transcribe to consent form and obtain patient signature   Informed Consent Details: Physician/Practitioner Attestation; Transcribe to consent form and obtain patient signature   Page Admitting Doctor upon patients arrival to unit/floor   Vital signs   Up with assistance   Initiate Carrier Fluid Protocol   Consult to hospitalist   Pulse oximetry, continuous   POC CBG, ED   ED EKG 12-Lead   Saline lock IV   Place in observation (patient's expected length of stay will be less than 2 midnights)    Following Medications were ordered in ER: Medications  vancomycin (VANCOREADY) IVPB 1500 mg/300 mL (1,500 mg Intravenous New Bag/Given 05/26/21 1759)  acyclovir (ZOVIRAX) 770 mg in dextrose 5 % 150 mL IVPB (has no administration in time range)  dexamethasone (DECADRON) injection 10 mg (10 mg Intravenous Given 05/26/21 1625)  cefTRIAXone (ROCEPHIN) 2 g in sodium chloride 0.9 % 100 mL IVPB (0 g Intravenous Stopped 05/26/21 1742)  sodium chloride 0.9 % bolus 1,000 mL (0 mLs Intravenous Stopped 05/26/21 1748)  acetaminophen (TYLENOL) tablet 650 mg (650 mg Oral Given 05/26/21 1627)        Consult Orders  (From admission, onward)           Start      Ordered   05/26/21 1741  Consult to hospitalist  Once       Provider:  (Not yet assigned)  Question Answer Comment  Place call to: [facility]   Reason for Consult Admit      05/26/21 1740              OTHER Significant initial  Findings:  labs showing:    Recent Labs  Lab 05/26/21 1624  NA 135  K 3.4*  CO2 25  GLUCOSE 108*  BUN 18  CREATININE 1.21  CALCIUM 9.3    Cr  Up from baseline see below Lab Results  Component Value Date   CREATININE 1.21 05/26/2021   CREATININE 0.84 11/21/2017   CREATININE 0.69 04/15/2015    Recent Labs  Lab 05/26/21 1624  AST 49*  ALT 62*  ALKPHOS 100  BILITOT 0.7  PROT 8.5*  ALBUMIN 4.3   Lab Results  Component Value Date   CALCIUM 9.3 05/26/2021       Plt: Lab Results  Component Value Date   PLT 260 05/26/2021    Recent Labs  Lab 05/26/21 1624  WBC 15.8*  NEUTROABS 13.2*  HGB 14.6  HCT 42.2  MCV 87.9  PLT 260    HG/HCT stable,      Component Value Date/Time   HGB 14.6 05/26/2021 1624   HGB 15.6 01/19/2021 1543   HCT 42.2 05/26/2021 1624   HCT 45.9 01/19/2021 1543   MCV 87.9 05/26/2021 1624   MCV 91 01/19/2021 1543       CBG (last 3)  Recent Labs    05/26/21 1444  GLUCAP 128*  Cultures:    Component Value Date/Time   SDES URINE, CLEAN CATCH 04/14/2015 1445   SPECREQUEST NONE 04/14/2015 1445   CULT NO GROWTH Performed at [facility]  04/14/2015 1445   REPTSTATUS 04/16/2015 FINAL 04/14/2015 1445     Radiological Exams on Admission: CT Head Wo Contrast  Result Date: 05/26/2021 CLINICAL DATA:  Seizures. Additional history provided: Seizure-like activity. EXAM: CT HEAD WITHOUT CONTRAST TECHNIQUE: Contiguous axial images were obtained from the base of the skull through the vertex without intravenous contrast. COMPARISON:  No pertinent prior exams available for comparison. FINDINGS: Brain: Mildly motion degraded exam. Cerebral volume is normal. There is no acute  intracranial hemorrhage. No demarcated cortical infarct. No extra-axial fluid collection. No evidence of an intracranial mass. No midline shift. Vascular: No hyperdense vessel. Skull: Normal. Negative for fracture or focal lesion. Sinuses/Orbits: Visualized orbits show no acute finding. Trace bilateral ethmoid sinus mucosal thickening at the imaged levels. Small left sphenoid sinus mucous retention cyst. IMPRESSION: Mildly motion degraded exam. No evidence of acute intracranial abnormality. Mild paranasal sinus disease at the imaged levels, as described. Electronically Signed   By: Jackey Loge DO   On: 05/26/2021 16:53   DG Chest Port 1 View  Result Date: 05/26/2021 CLINICAL DATA:  Questionable sepsis.  Evaluate for abnormality. EXAM: PORTABLE CHEST 1 VIEW COMPARISON:  None. FINDINGS: Cardiac silhouette is accentuated by low lung volumes and AP technique. Low lung volumes. Mild left basilar opacities. No visible pleural effusions or pneumothorax. No acute osseous abnormality. IMPRESSION: Low lung volumes with mild left basilar opacities, which could represent atelectasis, aspiration, or pneumonia. Dedicated PA and lateral radiographs could further evaluate if clinically indicated. Electronically Signed   By: Feliberto Harts MD   On: 05/26/2021 16:27   _______________________________________________________________________________________________________ Latest  Blood pressure 133/77, pulse (!) 104, temperature 97.7 F (36.5 C), resp. rate 18, height 6' (1.829 m), weight 77 kg, SpO2 98 %.   Review of Systems:    Pertinent positives include:   Fevers, chills, fatigue,confusion  Constitutional:  No weight loss, night sweats, weight loss  HEENT:  No headaches, Difficulty swallowing,Tooth/dental problems,Sore throat,  No sneezing, itching, ear ache, nasal congestion, post nasal drip,  Cardio-vascular:  No chest pain, Orthopnea, PND, anasarca, dizziness, palpitations.no Bilateral lower extremity  swelling  GI:  No heartburn, indigestion, abdominal pain, nausea, vomiting, diarrhea, change in bowel habits, loss of appetite, melena, blood in stool, hematemesis Resp:  no shortness of breath at rest. No dyspnea on exertion, No excess mucus, no productive cough, No non-productive cough, No coughing up of blood.No change in color of mucus.No wheezing. Skin:  no rash or lesions. No jaundice GU:  no dysuria, change in color of urine, no urgency or frequency. No straining to urinate.  No flank pain.  Musculoskeletal:  No joint pain or no joint swelling. No decreased range of motion. No back pain.  Psych:  No change in mood or affect. No depression or anxiety. No memory loss.  Neuro: no localizing neurological complaints, no tingling, no weakness, no double vision, no gait abnormality, no slurred speech, no   All systems reviewed and apart from HOPI all are negative _______________________________________________________________________________________________ Past Medical History:  History reviewed. No pertinent past medical history.    History reviewed. No pertinent surgical history.  Social History:  Ambulatory  independently       reports that he has never smoked. He has never used smokeless tobacco. He reports that he does not drink alcohol and does not use  drugs.      Family History:   Family History  Problem Relation Age of Onset   Hypertension Father   Mother has epilepsy ______________________________________________________________________________________________ Allergies: No Known Allergies   Prior to Admission medications   Medication Sig Start Date End Date Taking? Authorizing Provider  meloxicam (MOBIC) 7.5 MG tablet Take 1 tablet (7.5 mg total) by mouth daily. 01/21/21   Janeece Agee, NP    ___________________________________________________________________________________________________ Physical Exam: Vitals with BMI 05/26/2021 05/26/2021 05/26/2021   Height - - -  Weight - - -  BMI - - -  Systolic 133 117 885  Diastolic 77 93 64  Pulse 104 94 75     1. General:  in No Acute distress    acutely ill -appearing 2. Psychological: Alert and  Oriented 3. Head/ENT:    Dry Mucous Membranes                          Head Non traumatic, neck supple                         Poor Dentition 4. SKIN: decreased Skin turgor,  Skin clean Dry and intact no rash 5. Heart: Regular rate and rhythm no Murmur, no Rub or gallop 6. Lungs:  Clear to auscultation bilaterally, no wheezes or crackles   7. Abdomen: Soft,  non-tender, Non distended  bowel sounds present 8. Lower extremities: no clubbing, cyanosis, no  edema 9. Neurologically Grossly intact, moving all 4 extremities equally  not fully cooperating 10. MSK: Normal range of motion    Chart has been reviewed  ______________________________________________________________________________________________  Assessment/Plan  25 y.o. 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  DVT prophylaxis:  SCD     Code Status:    Code Status: Prior FULL CODE   as per patient   I had personally discussed CODE STATUS with patient     Family Communication:   Family  at  Bedside  plan of care was discussed   with  Wife,   Disposition Plan:     To home once workup is complete and  patient is stable   Following barriers for discharge:                            Electrolytes corrected                                                       Afebrile, white count improving able to transition to PO antibiotics  Will need to be able to tolerate PO                                              Will need consultants to evaluate patient prior to discharge                                           Consults called: neurology is aware  Admission status:  ED Disposition     ED Disposition  Admit   Condition  --   Comment  Hospital Area: [facility] [[id number]]  Level of Care: Progressive [102]  Admit to Progressive based on following criteria: NEUROLOGICAL AND NEUROSURGICAL complex patients with significant risk of instability, who do not meet ICU criteria, yet require close observation or frequent assessment (< / = every 2 - 4 hours) with medical / nursing intervention.  May place patient in observation at [facility] or [facility] if equivalent level of care is available:: Yes  Covid Evaluation: Confirmed COVID Negative  Diagnosis: Seizure (HCC) [205090]  Admitting Physician: Therisa Doyne [[id number]]  Attending Physician: Therisa Doyne [[id number]]           Obs     Level of care  progressive  tele indefinitely please discontinue once patient no longer qualifies COVID-19 Labs    Lab Results  Component Value Date   SARSCOV2NAA NEGATIVE 05/26/2021     Precautions: admitted as  Covid Negative     PPE: Used by the provider:   N95  eye Goggles,  Gloves    Hasini Peachey 05/26/2021, 10:01 PM    [facility]     after 2 AM please page floor coverage PA If 7AM-7PM, please contact the day team taking care of the patient using [email or web address]   Patient was evaluated in the context of the global COVID-19 pandemic, which necessitated consideration that the patient might be at risk for infection with the  SARS-CoV-2 virus that causes COVID-19. Institutional protocols and algorithms that pertain to the evaluation of patients at risk for COVID-19 are in a state of rapid change based on information released by regulatory bodies including the CDC and federal and state organizations. These policies and algorithms were followed during the patient's care.

## 2021-05-26 NOTE — ED Provider Notes (Signed)
Patient initially seen by Dr. Lockie Mola.  Please see his note pt presented to the ED with new onset seizures.  Noted to low grade fever.  Unsuccessful LP attempt.  Pt started on abx, antivirals.  Flouro lp ordered but will likely be in the AM.  Will consult with medical service for admission.   Linwood Dibbles, MD 05/26/21 [phone]

## 2021-05-26 NOTE — ED Notes (Signed)
ED TO INPATIENT HANDOFF REPORT  Name/Age/Gender Mario Davis 25 y.o. male  Code Status Code Status History    Date Active Date Inactive Code Status Order ID Comments User Context   04/14/2015 2205 04/15/2015 1250 Full Code [id number]  [name], [name], MD Inpatient    Questions for Most Recent Historical Code Status (Order [id number])       Home/SNF/Other Home  Chief Complaint Seizure [facility](HCC) [R56.9]  Level of Care/Admitting Diagnosis ED Disposition    ED Disposition  Admit   Condition  --   Comment  Hospital Area: [facility] [[id number]]  Level of Care: Progressive [102]  Admit to Progressive based on following criteria: NEUROLOGICAL AND NEUROSURGICAL complex patients with significant risk of instability, who do not meet ICU criteria, yet require close observation or frequent assessment (< / = every 2 - 4 hours) with medical / nursing intervention.  [facility] patient [facility] if equivalent level of care is available:: Yes  Covid Evaluation: Confirmed COVID Negative  Diagnosis: Seizure (HCC) [205090]  Admitting Physician: [name], [name] [[id number]]  Attending Physician: [name], [name] [[id number]]         Medical History History reviewed. No pertinent past medical history.  Allergies No Known Allergies  IV Location/Drains/Wounds Patient Lines/Drains/Airways Status    Active Line/Drains/Airways    Name Placement date Placement time Site Days   Peripheral IV 05/26/21 18 G Left Antecubital 05/26/21  1612  Antecubital  less than 1          Labs/Imaging Results for orders placed or performed during the hospital encounter of 05/26/21 (from the past 48 hour(s))  POC CBG, ED     Status: Abnormal   Collection Time: 05/26/21  2:44 PM  Result Value Ref Range   Glucose-Capillary 128 (H) 70 - 99 mg/dL    Comment: Glucose reference range applies only to samples taken after fasting for at least 8 hours.  Resp Panel  by RT-PCR (Flu A&B, Covid) Nasopharyngeal Swab     Status: None   Collection Time: 05/26/21  3:48 PM   Specimen: Nasopharyngeal Swab; Nasopharyngeal(NP) swabs in vial transport medium  Result Value Ref Range   SARS Coronavirus 2 by RT PCR NEGATIVE NEGATIVE    Comment: (NOTE) SARS-CoV-2 target nucleic acids are NOT DETECTED.  The SARS-CoV-2 RNA is generally detectable in upper respiratory specimens during the acute phase of infection. The lowest concentration of SARS-CoV-2 viral copies this assay can detect is 138 copies/mL. A negative result does not preclude SARS-Cov-2 infection and should not be used as the sole basis for treatment or other patient management decisions. A negative result may occur with  improper specimen collection/handling, submission of specimen other than nasopharyngeal swab, presence of viral mutation(s) within the areas targeted by this assay, and inadequate number of viral copies(<138 copies/mL). A negative result must be combined with clinical observations, patient history, and epidemiological information. The expected result is Negative.  Fact Sheet for Patients:  BloggerCourse.comhttps://www.fda.gov/media/152166/download  Fact Sheet for Healthcare Providers:  SeriousBroker.ithttps://www.fda.gov/media/152162/download  This test is no t yet approved or cleared by the [place] FDA and  has been authorized for detection and/or diagnosis of SARS-CoV-2 by FDA under an Emergency Use Authorization (EUA). This EUA will remain  in effect (meaning this test can be used) for the duration of the COVID-19 declaration under Section 564(b)(1) of the Act, 21 U.S.C.section 360bbb-3(b)(1), unless the authorization is terminated  or revoked sooner.  Influenza A by PCR NEGATIVE NEGATIVE   Influenza B by PCR NEGATIVE NEGATIVE    Comment: (NOTE) The Xpert Xpress SARS-CoV-2/FLU/RSV plus assay is intended as an aid in the diagnosis of influenza from Nasopharyngeal swab specimens and should not  be used as a sole basis for treatment. Nasal washings and aspirates are unacceptable for Xpert Xpress SARS-CoV-2/FLU/RSV testing.  Fact Sheet for Patients: BloggerCourse.com  Fact Sheet for Healthcare Providers: SeriousBroker.it  This test is not yet approved or cleared by the [place] FDA and has been authorized for detection and/or diagnosis of SARS-CoV-2 by FDA under an Emergency Use Authorization (EUA). This EUA will remain in effect (meaning this test can be used) for the duration of the COVID-19 declaration under Section 564(b)(1) of the Act, 21 U.S.C. section 360bbb-3(b)(1), unless the authorization is terminated or revoked.  Performed at [facility], [address] [place], [place] [address]   Lactic acid, plasma     Status: None   Collection Time: 05/26/21  4:24 PM  Result Value Ref Range   Lactic Acid, Venous 1.1 0.5 - 1.9 mmol/L    Comment: Performed at [facility], [address], [place], [place] [address]  Comprehensive metabolic panel     Status: Abnormal   Collection Time: 05/26/21  4:24 PM  Result Value Ref Range   Sodium 135 135 - 145 mmol/L   Potassium 3.4 (L) 3.5 - 5.1 mmol/L   Chloride 100 98 - 111 mmol/L   CO2 25 22 - 32 mmol/L   Glucose, Bld 108 (H) 70 - 99 mg/dL    Comment: Glucose reference range applies only to samples taken after fasting for at least 8 hours.   BUN 18 6 - 20 mg/dL   Creatinine, Ser 1.22 0.61 - 1.24 mg/dL   Calcium 9.3 8.9 - 48.2 mg/dL   Total Protein 8.5 (H) 6.5 - 8.1 g/dL   Albumin 4.3 3.5 - 5.0 g/dL   AST 49 (H) 15 - 41 U/L   ALT 62 (H) 0 - 44 U/L   Alkaline Phosphatase 100 38 - 126 U/L   Total Bilirubin 0.7 0.3 - 1.2 mg/dL   GFR, Estimated >50 >03 mL/min    Comment: (NOTE) Calculated using the CKD-EPI Creatinine Equation (2021)    Anion gap 10 5 - 15    Comment: Performed at [facility], [address]  [place], [place] [address]  CBC WITH DIFFERENTIAL     Status: Abnormal   Collection Time: 05/26/21  4:24 PM  Result Value Ref Range   WBC 15.8 (H) 4.0 - 10.5 K/uL   RBC 4.80 4.22 - 5.81 MIL/uL   Hemoglobin 14.6 13.0 - 17.0 g/dL   HCT 89.1 69.4 - 50.3 %   MCV 87.9 80.0 - 100.0 fL   MCH 30.4 26.0 - 34.0 pg   MCHC 34.6 30.0 - 36.0 g/dL   RDW 88.8 28.0 - 03.4 %   Platelets 260 150 - 400 K/uL   nRBC 0.0 0.0 - 0.2 %   Neutrophils Relative % 84 %   Neutro Abs 13.2 (H) 1.7 - 7.7 K/uL   Lymphocytes Relative 7 %   Lymphs Abs 1.1 0.7 - 4.0 K/uL   Monocytes Relative 8 %   Monocytes Absolute 1.3 (H) 0.1 - 1.0 K/uL   Eosinophils Relative 0 %   Eosinophils Absolute 0.0 0.0 - 0.5 K/uL   Basophils Relative 0 %   Basophils Absolute 0.0 0.0 - 0.1 K/uL  Immature Granulocytes 1 %   Abs Immature Granulocytes 0.11 (H) 0.00 - 0.07 K/uL    Comment: Performed at [facility], [address], [place], [place] [address]  Protime-INR     Status: None   Collection Time: 05/26/21  4:24 PM  Result Value Ref Range   Prothrombin Time 13.4 11.4 - 15.2 seconds   INR 1.0 0.8 - 1.2    Comment: (NOTE) INR goal varies based on device and disease states. Performed at [facility], [address], [place], [place] [address]   APTT     Status: None   Collection Time: 05/26/21  4:24 PM  Result Value Ref Range   aPTT 29 24 - 36 seconds    Comment: Performed at [facility], [address] [place], [place] [address]  Urinalysis, Routine w reflex microscopic Urine, Clean Catch     Status: Abnormal   Collection Time: 05/26/21  6:30 PM  Result Value Ref Range   Color, Urine COLORLESS (A) YELLOW   APPearance CLEAR CLEAR   Specific Gravity, Urine 1.001 (L) 1.005 - 1.030   pH 6.0 5.0 - 8.0   Glucose, UA NEGATIVE NEGATIVE mg/dL   Hgb urine dipstick NEGATIVE NEGATIVE   Bilirubin Urine NEGATIVE NEGATIVE   Ketones, ur NEGATIVE NEGATIVE mg/dL   Protein, ur NEGATIVE  NEGATIVE mg/dL   Nitrite NEGATIVE NEGATIVE   Leukocytes,Ua NEGATIVE NEGATIVE    Comment: Performed at [facility], [address] [place], [place] [address]  Rapid urine drug screen (hospital performed)     Status: Abnormal   Collection Time: 05/26/21  6:30 PM  Result Value Ref Range   Opiates NONE DETECTED NONE DETECTED   Cocaine NONE DETECTED NONE DETECTED   Benzodiazepines POSITIVE (A) NONE DETECTED   Amphetamines NONE DETECTED NONE DETECTED   Tetrahydrocannabinol NONE DETECTED NONE DETECTED   Barbiturates NONE DETECTED NONE DETECTED    Comment: (NOTE) DRUG SCREEN FOR MEDICAL PURPOSES ONLY.  IF CONFIRMATION IS NEEDED FOR ANY PURPOSE, NOTIFY LAB WITHIN 5 DAYS.  LOWEST DETECTABLE LIMITS FOR URINE DRUG SCREEN Drug Class                     Cutoff (ng/mL) Amphetamine and metabolites    1000 Barbiturate and metabolites    200 Benzodiazepine                 200 Tricyclics and metabolites     300 Opiates and metabolites        300 Cocaine and metabolites        300 THC                            50 Performed at [facility], [address] [place], [place] [address]    CT Head Wo Contrast  Result Date: 05/26/2021 CLINICAL DATA:  Seizures. Additional history provided: Seizure-like activity. EXAM: CT HEAD WITHOUT CONTRAST TECHNIQUE: Contiguous axial images were obtained from the base of the skull through the vertex without intravenous contrast. COMPARISON:  No pertinent prior exams available for comparison. FINDINGS: Brain: Mildly motion degraded exam. Cerebral volume is normal. There is no acute intracranial hemorrhage. No demarcated cortical infarct. No extra-axial fluid collection. No evidence of an intracranial mass. No midline shift. Vascular: No hyperdense vessel. Skull: Normal. Negative for fracture or focal lesion. Sinuses/Orbits: Visualized orbits show no acute finding. Trace bilateral ethmoid sinus mucosal thickening  at the imaged  levels. Small left sphenoid sinus mucous retention cyst. IMPRESSION: Mildly motion degraded exam. No evidence of acute intracranial abnormality. Mild paranasal sinus disease at the imaged levels, as described. Electronically Signed   By: Jackey Loge DO   On: 05/26/2021 16:53   DG Chest Port 1 View  Result Date: 05/26/2021 CLINICAL DATA:  Questionable sepsis.  Evaluate for abnormality. EXAM: PORTABLE CHEST 1 VIEW COMPARISON:  None. FINDINGS: Cardiac silhouette is accentuated by low lung volumes and AP technique. Low lung volumes. Mild left basilar opacities. No visible pleural effusions or pneumothorax. No acute osseous abnormality. IMPRESSION: Low lung volumes with mild left basilar opacities, which could represent atelectasis, aspiration, or pneumonia. Dedicated PA and lateral radiographs could further evaluate if clinically indicated. Electronically Signed   By: Feliberto Harts MD   On: 05/26/2021 16:27    Pending Labs Unresulted Labs (From admission, onward)    Start     Ordered   05/26/21 1614  HIV Antibody (routine testing w rflx)  Once,   STAT        05/26/21 1614   05/26/21 1547  CSF cell count with differential collection tube #: 4  ONCE - STAT,   STAT       Question Answer Comment  collection tube # 4   Are there also cytology or pathology orders on this specimen? No      05/26/21 1546   05/26/21 1547  CSF cell count with differential collection tube #: 1  ONCE - STAT,   STAT       Question Answer Comment  collection tube # 1   Are there also cytology or pathology orders on this specimen? No      05/26/21 1546   05/26/21 1545  HSV 1/2 PCR, CSF Cerebrospinal Fluid  ONCE - STAT,   STAT        05/26/21 1545   05/26/21 1545  Cryptococcal antigen, CSF  ONCE - STAT,   STAT        05/26/21 1545   05/26/21 1544  Protein and glucose, CSF  ONCE - STAT,   STAT        05/26/21 1545   05/26/21 1544  CSF culture w Gram Stain  ONCE - STAT,   STAT       Question Answer Comment  Are there  also cytology or pathology orders on this specimen? No   Patient immune status Normal      05/26/21 1545   05/26/21 1537  Blood Culture (routine x 2)  (Septic presentation on arrival (screening labs, nursing and treatment orders for obvious sepsis))  BLOOD CULTURE X 2,   STAT      05/26/21 1540   05/26/21 1537  Urine culture  (Septic presentation on arrival (screening labs, nursing and treatment orders for obvious sepsis))  ONCE - STAT,   STAT        05/26/21 1540          Vitals/Pain Today's Vitals   05/26/21 1600 05/26/21 1730 05/26/21 1742 05/26/21 1900  BP: (!) 117/93 133/77  127/67  Pulse: 94 (!) 104  81  Resp:  18  20  Temp:      SpO2: 94% 98%  94%  Weight:      Height:      PainSc:   0-No pain     Isolation Precautions No active isolations  Medications Medications  dexamethasone (DECADRON) injection 10 mg (10 mg Intravenous Given 05/26/21 1625)  cefTRIAXone (ROCEPHIN) 2 g in sodium chloride 0.9 % 100 mL IVPB (0 g Intravenous Stopped 05/26/21 1742)  sodium chloride 0.9 % bolus 1,000 mL (0 mLs Intravenous Stopped 05/26/21 1748)  acetaminophen (TYLENOL) tablet 650 mg (650 mg Oral Given 05/26/21 1627)  vancomycin (VANCOREADY) IVPB 1500 mg/300 mL (1,500 mg Intravenous New Bag/Given 05/26/21 1759)  acyclovir (ZOVIRAX) 770 mg in dextrose 5 % 150 mL IVPB (770 mg Intravenous New Bag/Given 05/26/21 1853)    Mobility walks

## 2021-05-26 NOTE — Progress Notes (Signed)
A consult was received from an ED physician for acyclovir per pharmacy dosing.  The patient's profile has been reviewed for ht/wt/allergies/indication/available labs.    A one time order has been placed for acylovir 10mg /kg/dose (770 mg).  Further antibiotics/pharmacy consults should be ordered by admitting physician if indicated.                       Thank you, 05/26/2021  5:43 PM

## 2021-05-26 NOTE — ED Notes (Signed)
Pt ambulated to restroom independently, steady gait
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
## 2021-05-26 NOTE — Plan of Care (Signed)
  Problem: Clinical Measurements: Goal: Ability to maintain clinical measurements within normal limits will improve Outcome: Progressing Goal: Will remain free from infection Outcome: Progressing Goal: Diagnostic test results will improve Outcome: Progressing   Problem: Safety: Goal: Ability to remain free from injury will improve Outcome: Progressing   

## 2021-05-26 NOTE — ED Notes (Signed)
Pt fiance signed consent form for lumbar puncture, this RN explained to pt what this procedure consists of, pt agrees, consent form brought to medical record folder by Diplomatic Services operational officer

## 2021-05-26 NOTE — ED Notes (Signed)
Pt to MRI via WC

## 2021-05-26 NOTE — ED Provider Notes (Signed)
[facility]-EMERGENCY DEPT Provider Note   CSN: [id number] Arrival date & time: 05/26/21  1351     History Chief Complaint  Patient presents with   Seizures    Mario Davis is a 25 y.o. male.  The history is provided by the patient and a relative.  Seizures Seizure activity on arrival: no   Episode characteristics: abnormal movements, combativeness and confusion   Return to baseline: yes (still sleeping but feeling better. 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 Progression:  Resolved Context: fever (Fever, headache, chills, body aches last night and today. Having neck pain as well. No recent travel. Denies ETOH abuse or drug abuse. No rash. No hiking.)   Recent head injury:  No recent head injuries PTA treatment:  Midazolam History of seizures: no       History reviewed. No pertinent past medical history.  Patient Active Problem List   Diagnosis Date Noted   Abdominal pain in male 04/14/2015    History reviewed. No pertinent surgical history.     Family History  Problem Relation Age of Onset   Hypertension Father     Social History   Tobacco Use   Smoking status: Never   Smokeless tobacco: Never  Vaping Use   Vaping Use: Never used  Substance Use Topics   Alcohol use: No   Drug use: No    Home Medications Prior to Admission medications   Medication Sig Start Date End Date Taking? Authorizing Provider  meloxicam (MOBIC) 7.5 MG tablet Take 1 tablet (7.5 mg total) by mouth daily. 01/21/21   Janeece AgeeMorrow, Richard, NP    Allergies    Patient has no known allergies.  Review of Systems   Review of Systems  Constitutional:  Positive for fever. Negative for chills.  HENT:  Negative for ear pain and sore throat.   Eyes:  Negative for pain and visual disturbance.  Respiratory:  Negative for cough and shortness of breath.   Cardiovascular:  Negative for chest pain and palpitations.   Gastrointestinal:  Negative for abdominal pain, diarrhea, nausea and vomiting.  Genitourinary:  Negative for dysuria and hematuria.  Musculoskeletal:  Positive for myalgias and neck pain. Negative for arthralgias and back pain.  Skin:  Negative for color change and rash.  Neurological:  Positive for seizures and headaches. Negative for tremors, syncope, speech difficulty, light-headedness and numbness.  All other systems reviewed and are negative.  Physical Exam Updated Vital Signs  ED Triage Vitals  Enc Vitals Group     BP 05/26/21 1400 (!) 98/53     Pulse Rate 05/26/21 1400 (!) 125     Resp 05/26/21 1400 18     Temp 05/26/21 1400 (!) 100.6 F (38.1 C)     Temp src --      SpO2 05/26/21 1400 92 %     Weight 05/26/21 1409 169 lb 12.1 oz (77 kg)     Height 05/26/21 1409 6' (1.829 m)     Head Circumference --      Peak Flow --      Pain Score 05/26/21 1409 0     Pain Loc --      Pain Edu? --      Excl. in GC? --     Physical Exam Vitals and nursing note reviewed.  Constitutional:      General: He is not in acute distress.    Appearance: He is  well-developed. He is not ill-appearing.  HENT:     Head: Normocephalic and atraumatic.     Nose: Nose normal.     Mouth/Throat:     Mouth: Mucous membranes are moist.  Eyes:     Extraocular Movements: Extraocular movements intact.     Conjunctiva/sclera: Conjunctivae normal.     Pupils: Pupils are equal, round, and reactive to light.  Cardiovascular:     Rate and Rhythm: Normal rate and regular rhythm.     Pulses: Normal pulses.     Heart sounds: Normal heart sounds. No murmur heard. Pulmonary:     Effort: Pulmonary effort is normal. No respiratory distress.     Breath sounds: Normal breath sounds.  Abdominal:     General: There is no distension.     Palpations: Abdomen is soft.     Tenderness: There is no abdominal tenderness.  Musculoskeletal:     Cervical back: Normal range of motion and neck supple. No rigidity.   Skin:    General: Skin is warm and dry.     Capillary Refill: Capillary refill takes less than 2 seconds.  Neurological:     General: No focal deficit present.     Mental Status: He is alert and oriented to person, place, and time.     Cranial Nerves: No cranial nerve deficit.     Sensory: No sensory deficit.     Motor: No weakness.     Coordination: Coordination normal.     Comments: 5+ out of 5 strength throughout, normal sensation, no drift, normal finger-nose-finger, normal speech    ED Results / Procedures / Treatments   Labs (all labs ordered are listed, but only abnormal results are displayed) Labs Reviewed  COMPREHENSIVE METABOLIC PANEL - Abnormal; Notable for the following components:      Result Value   Potassium 3.4 (*)    Glucose, Bld 108 (*)    Total Protein 8.5 (*)    AST 49 (*)    ALT 62 (*)    All other components within normal limits  CBC WITH DIFFERENTIAL/PLATELET - Abnormal; Notable for the following components:   WBC 15.8 (*)    Neutro Abs 13.2 (*)    Monocytes Absolute 1.3 (*)    Abs Immature Granulocytes 0.11 (*)    All other components within normal limits  CBG MONITORING, ED - Abnormal; Notable for the following components:   Glucose-Capillary 128 (*)    All other components within normal limits  RESP PANEL BY RT-PCR (FLU A&B, COVID) ARPGX2  CULTURE, BLOOD (ROUTINE X 2)  CULTURE, BLOOD (ROUTINE X 2)  URINE CULTURE  CSF CULTURE W GRAM STAIN  LACTIC ACID, PLASMA  PROTIME-INR  APTT  LACTIC ACID, PLASMA  URINALYSIS, ROUTINE W REFLEX MICROSCOPIC  RAPID URINE DRUG SCREEN, HOSP PERFORMED  PROTEIN AND GLUCOSE, CSF  HSV 1/2 PCR, CSF  CRYPTOCOCCAL ANTIGEN, CSF  CSF CELL COUNT WITH DIFFERENTIAL  CSF CELL COUNT WITH DIFFERENTIAL  HIV ANTIBODY (ROUTINE TESTING W REFLEX)    EKG None  Radiology CT Head Wo Contrast  Result Date: 05/26/2021 CLINICAL DATA:  Seizures. Additional history provided: Seizure-like activity. EXAM: CT HEAD WITHOUT CONTRAST  TECHNIQUE: Contiguous axial images were obtained from the base of the skull through the vertex without intravenous contrast. COMPARISON:  No pertinent prior exams available for comparison. FINDINGS: Brain: Mildly motion degraded exam. Cerebral volume is normal. There is no acute intracranial hemorrhage. No demarcated cortical infarct. No extra-axial fluid collection. No evidence of an intracranial  mass. No midline shift. Vascular: No hyperdense vessel. Skull: Normal. Negative for fracture or focal lesion. Sinuses/Orbits: Visualized orbits show no acute finding. Trace bilateral ethmoid sinus mucosal thickening at the imaged levels. Small left sphenoid sinus mucous retention cyst. IMPRESSION: Mildly motion degraded exam. No evidence of acute intracranial abnormality. Mild paranasal sinus disease at the imaged levels, as described. Electronically Signed   By: Jackey Loge DO   On: 05/26/2021 16:53   DG Chest Port 1 View  Result Date: 05/26/2021 CLINICAL DATA:  Questionable sepsis.  Evaluate for abnormality. EXAM: PORTABLE CHEST 1 VIEW COMPARISON:  None. FINDINGS: Cardiac silhouette is accentuated by low lung volumes and AP technique. Low lung volumes. Mild left basilar opacities. No visible pleural effusions or pneumothorax. No acute osseous abnormality. IMPRESSION: Low lung volumes with mild left basilar opacities, which could represent atelectasis, aspiration, or pneumonia. Dedicated PA and lateral radiographs could further evaluate if clinically indicated. Electronically Signed   By: Feliberto Harts MD   On: 05/26/2021 16:27    Procedures Procedures   Medications Ordered in ED Medications  vancomycin (VANCOREADY) IVPB 1500 mg/300 mL (has no administration in time range)  lidocaine (PF) (XYLOCAINE) 1 % injection (has no administration in time range)  dexamethasone (DECADRON) injection 10 mg (10 mg Intravenous Given 05/26/21 1625)  cefTRIAXone (ROCEPHIN) 2 g in sodium chloride 0.9 % 100 mL IVPB (2 g  Intravenous New Bag/Given 05/26/21 1627)  sodium chloride 0.9 % bolus 1,000 mL (1,000 mLs Intravenous New Bag/Given 05/26/21 1625)  acetaminophen (TYLENOL) tablet 650 mg (650 mg Oral Given 05/26/21 1627)    ED Course  I have reviewed the triage vital signs and the nursing notes.  Pertinent labs & imaging results that were available during my care of the patient were reviewed by me and considered in my medical decision making (see chart for details).    MDM Rules/Calculators/A&P                          Mario Davis is here after seizure-like episode.  Febrile tachycardic but otherwise unremarkable vitals.  Patient received Haldol and Versed in the field for being combative after what appeared to be maybe a seizure-like episode described by his wife.  He states that yesterday he was not feeling well and per his wife he felt warm.  He has been complaining of body aches, headache and neck pain.  He has had some chronic headache and neck pain for a while as well.  No history of seizures.  Denies any cough, sputum production, pain with urination, abdominal pain.  Denies any alcohol or drug use.  No recent travel, hiking.  Does not have a rash.  Neurologically patient appears to be intact.  He still feels really tired but he is able to follow commands and overall appears well.  He is having some pain in his neck.  Does not appear to have any obvious nuchal rigidity or pain with moving his legs but does have pain when he flexes up his neck.  Patient still has a headache.  We will initiate infectious work-up including lumbar puncture to evaluate for meningitis.  Will empirically give IV antibiotics and Decadron per meningitis protocol.  He has been consented for lumbar puncture. Upon chart review had an EEG done in 2013 for staring spells that's was normal.  Patient has leukocytosis of 15.8.  Overall lab work is otherwise unremarkable including lactic acid.  Chest x-ray negative for infection.  CT  scan of head without any acute process.  I was unable to get lumbar puncture after multiple attempts.  Signed out to Dr. Lynelle Doctor who will discuss with IR about obtaining lumbar puncture.  Anticipate likely admission.  This chart was dictated using voice recognition software.  Despite best efforts to proofread,  errors can occur which can change the documentation meaning.   Final Clinical Impression(s) / ED Diagnoses Final diagnoses:  Fever  Seizure (HCC)  Nonintractable headache, unspecified chronicity pattern, unspecified headache type    Rx / DC Orders ED Discharge Orders     None        Virgina Norfolk, DO 05/26/21 1735

## 2021-05-26 NOTE — Progress Notes (Signed)
A consult was received from an ED physician for vancomycin per pharmacy dosing.  The patient's profile has been reviewed for ht/wt/allergies/indication/available labs.   A one time order has been placed for vancomycin 1500 mg.    Further antibiotics/pharmacy consults should be ordered by admitting physician if indicated.                       Thank you,  Herby Abraham, Pharm.D 05/26/2021 4:22 PM

## 2021-05-27 ENCOUNTER — Inpatient Hospital Stay (HOSPITAL_COMMUNITY)
Admission: EM | Admit: 2021-05-27 | Discharge: 2021-05-27 | Disposition: A | Discharge status: Home / Self Care | Payer: Medicaid Other | Source: Home / Self Care | Attending: Neurology | Admitting: Neurology

## 2021-05-27 ENCOUNTER — Observation Stay (HOSPITAL_COMMUNITY): Payer: Medicaid Other

## 2021-05-27 DIAGNOSIS — D72829 Elevated white blood cell count, unspecified: Secondary | ICD-10-CM | POA: Diagnosis present

## 2021-05-27 DIAGNOSIS — Z791 Long term (current) use of non-steroidal anti-inflammatories (NSAID): Secondary | ICD-10-CM | POA: Diagnosis not present

## 2021-05-27 DIAGNOSIS — G039 Meningitis, unspecified: Secondary | ICD-10-CM | POA: Diagnosis present

## 2021-05-27 DIAGNOSIS — E876 Hypokalemia: Secondary | ICD-10-CM | POA: Diagnosis present

## 2021-05-27 DIAGNOSIS — N179 Acute kidney failure, unspecified: Secondary | ICD-10-CM | POA: Diagnosis present

## 2021-05-27 DIAGNOSIS — Z20822 Contact with and (suspected) exposure to covid-19: Secondary | ICD-10-CM | POA: Diagnosis present

## 2021-05-27 DIAGNOSIS — Z820 Family history of epilepsy and other diseases of the nervous system: Secondary | ICD-10-CM | POA: Diagnosis not present

## 2021-05-27 DIAGNOSIS — M436 Torticollis: Secondary | ICD-10-CM | POA: Diagnosis present

## 2021-05-27 DIAGNOSIS — R569 Unspecified convulsions: Principal | ICD-10-CM

## 2021-05-27 DIAGNOSIS — E860 Dehydration: Secondary | ICD-10-CM | POA: Diagnosis present

## 2021-05-27 LAB — CBC WITH DIFFERENTIAL/PLATELET
Abs Immature Granulocytes: 0.09 10*3/uL — ABNORMAL HIGH (ref 0.00–0.07)
Basophils Absolute: 0 10*3/uL (ref 0.0–0.1)
Basophils Relative: 0 %
Eosinophils Absolute: 0 10*3/uL (ref 0.0–0.5)
Eosinophils Relative: 0 %
HCT: 37.3 % — ABNORMAL LOW (ref 39.0–52.0)
Hemoglobin: 13 g/dL (ref 13.0–17.0)
Immature Granulocytes: 1 %
Lymphocytes Relative: 7 %
Lymphs Abs: 1.2 10*3/uL (ref 0.7–4.0)
MCH: 31.3 pg (ref 26.0–34.0)
MCHC: 34.9 g/dL (ref 30.0–36.0)
MCV: 89.7 fL (ref 80.0–100.0)
Monocytes Absolute: 1.2 10*3/uL — ABNORMAL HIGH (ref 0.1–1.0)
Monocytes Relative: 7 %
Neutro Abs: 15.5 10*3/uL — ABNORMAL HIGH (ref 1.7–7.7)
Neutrophils Relative %: 85 %
Platelets: 272 10*3/uL (ref 150–400)
RBC: 4.16 MIL/uL — ABNORMAL LOW (ref 4.22–5.81)
RDW: 13.1 % (ref 11.5–15.5)
WBC: 18 10*3/uL — ABNORMAL HIGH (ref 4.0–10.5)
nRBC: 0 % (ref 0.0–0.2)
nRBC: 0 /100 WBC

## 2021-05-27 LAB — CSF CELL COUNT WITH DIFFERENTIAL
RBC Count, CSF: 0 /mm3
Tube #: 4
WBC, CSF: 0 /mm3 (ref 0–5)

## 2021-05-27 LAB — PROCALCITONIN: Procalcitonin: <0.1 ng/mL

## 2021-05-27 LAB — BASIC METABOLIC PANEL
Anion gap: 7 (ref 5–15)
BUN: 14 mg/dL (ref 6–20)
CO2: 23 mmol/L (ref 22–32)
Calcium: 8.9 mg/dL (ref 8.9–10.3)
Chloride: 107 mmol/L (ref 98–111)
Creatinine, Ser: 0.89 mg/dL (ref 0.61–1.24)
GFR, Estimated: >60 mL/min (ref >60)
Glucose, Bld: 151 mg/dL — ABNORMAL HIGH (ref 70–99)
Potassium: 4.5 mmol/L (ref 3.5–5.1)
Sodium: 137 mmol/L (ref 135–145)

## 2021-05-27 LAB — COMPREHENSIVE METABOLIC PANEL
ALT: 52 U/L — ABNORMAL HIGH (ref 0–44)
AST: 38 U/L (ref 15–41)
Albumin: 3.8 g/dL (ref 3.5–5.0)
Alkaline Phosphatase: 81 U/L (ref 38–126)
Anion gap: 9 (ref 5–15)
BUN: 15 mg/dL (ref 6–20)
CO2: 22 mmol/L (ref 22–32)
Calcium: 8.9 mg/dL (ref 8.9–10.3)
Chloride: 106 mmol/L (ref 98–111)
Creatinine, Ser: 0.91 mg/dL (ref 0.61–1.24)
GFR, Estimated: >60 mL/min (ref >60)
Glucose, Bld: 150 mg/dL — ABNORMAL HIGH (ref 70–99)
Potassium: 4.7 mmol/L (ref 3.5–5.1)
Sodium: 137 mmol/L (ref 135–145)
Total Bilirubin: 0.7 mg/dL (ref 0.3–1.2)
Total Protein: 7.3 g/dL (ref 6.5–8.1)

## 2021-05-27 LAB — VITAMIN B12: Vitamin B-12: 318 pg/mL (ref 180–914)

## 2021-05-27 LAB — PHOSPHORUS: Phosphorus: 2.6 mg/dL (ref 2.5–4.6)

## 2021-05-27 LAB — CREATININE, URINE, RANDOM: Creatinine, Urine: 74.57 mg/dL

## 2021-05-27 LAB — PROTEIN, CSF: Total  Protein, CSF: 22 mg/dL (ref 15–45)

## 2021-05-27 LAB — SODIUM, URINE, RANDOM: Sodium, Ur: 64 mmol/L

## 2021-05-27 LAB — MRSA NEXT GEN BY PCR, NASAL: MRSA by PCR Next Gen: NOT DETECTED

## 2021-05-27 LAB — GLUCOSE, CSF: Glucose, CSF: 83 mg/dL — ABNORMAL HIGH (ref 40–70)

## 2021-05-27 LAB — TSH: TSH: 0.368 u[IU]/mL (ref 0.350–4.500)

## 2021-05-27 LAB — MAGNESIUM: Magnesium: 2.5 mg/dL — ABNORMAL HIGH (ref 1.7–2.4)

## 2021-05-27 MED ORDER — ACETAMINOPHEN 325 MG PO TABS
650.0000 mg | ORAL_TABLET | Freq: Four times a day (QID) | ORAL | Status: DC | PRN
  Administered 2021-05-27: 650 mg via ORAL
  Filled 2021-05-27: qty 2

## 2021-05-27 MED ORDER — HYDROCODONE-ACETAMINOPHEN 5-325 MG PO TABS: 1.0000 | ORAL_TABLET | ORAL | Status: DC | PRN

## 2021-05-27 MED ORDER — ACETAMINOPHEN 650 MG RE SUPP: 650.0000 mg | Freq: Four times a day (QID) | RECTAL | Status: DC | PRN

## 2021-05-27 MED ORDER — VITAMIN B-12 1000 MCG PO TABS
1000.0000 ug | ORAL_TABLET | Freq: Every day | ORAL | Status: DC
  Administered 2021-05-27 – 2021-05-28 (×2): 1000 ug via ORAL
  Filled 2021-05-27 (×2): qty 1

## 2021-05-27 MED ORDER — TRAMADOL HCL 50 MG PO TABS
50.0000 mg | ORAL_TABLET | Freq: Four times a day (QID) | ORAL | Status: DC | PRN
Start: 2021-05-27 — End: 2021-05-28

## 2021-05-27 MED ORDER — IBUPROFEN 800 MG PO TABS
800.0000 mg | ORAL_TABLET | Freq: Three times a day (TID) | ORAL | Status: DC | PRN
  Administered 2021-05-27 – 2021-05-28 (×2): 800 mg via ORAL
  Filled 2021-05-27 (×2): qty 1

## 2021-05-27 MED ORDER — SODIUM CHLORIDE 0.9 % IV SOLN
2.0000 g | Freq: Two times a day (BID) | INTRAVENOUS | Status: DC
  Administered 2021-05-27: 2 g via INTRAVENOUS
  Filled 2021-05-27: qty 2
  Filled 2021-05-27 (×2): qty 20

## 2021-05-27 MED ORDER — LEVETIRACETAM 500 MG PO TABS
500.0000 mg | ORAL_TABLET | Freq: Two times a day (BID) | ORAL | Status: DC
  Administered 2021-05-27 – 2021-05-28 (×3): 500 mg via ORAL
  Filled 2021-05-27 (×3): qty 1

## 2021-05-27 NOTE — Progress Notes (Signed)
Offsite EEG completed; results pending. 

## 2021-05-27 NOTE — Procedures (Signed)
Patient Name: Mario Davis  MRN: [id number]  Epilepsy Attending: Charlsie Quest  Referring Physician/Provider: Dr Caryl Pina Date: 05/27/2021 Duration: 23.48 mins  Patient history: 25 year old male presenting with first-time seizure activity.  EEG to evaluate for seizures.  Level of alertness: Awake, asleep  AEDs during EEG study: Keppra  Technical aspects: This EEG study was done with scalp electrodes positioned according to the 10-20 International system of electrode placement. Electrical activity was acquired at a sampling rate of 500Hz  and reviewed with a high frequency filter of 70Hz  and a low frequency filter of 1Hz . EEG data were recorded continuously and digitally stored.   Description: The posterior dominant rhythm consists of 8 Hz activity of moderate voltage (25-35 uV) seen predominantly in posterior head regions, symmetric and reactive to eye opening and eye closing. Sleep was characterized by vertex waves, sleep spindles (12 to 14 Hz), maximal frontocentral region. Hyperventilation did not show any EEG change.  Physiologic photic driving was not seen during photic stimulation.  No eeg change was seen during hyperventilation.  IMPRESSION: This study is within normal limits. No seizures or epileptiform discharges were seen throughout the recording.  Mario Davis 
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
## 2021-05-27 NOTE — Plan of Care (Signed)
Results for VITTORIO, MOHS (MRN [id number]) as of 05/27/2021 16:34  Ref. Range 05/27/2021 13:00  Appearance, CSF Latest Ref Range: CLEAR  CLEAR (A)  Glucose, CSF Latest Ref Range: 40 - 70 mg/dL 83 (H)  RBC Count, CSF Latest Ref Range: 0 /cu mm 0  WBC, CSF Latest Ref Range: 0 - 5 /cu mm 0  Other Cells, CSF Unknown TOO FEW TO COUNT, SMEAR AVAILABLE FOR REVIEW  Color, CSF Latest Ref Range: COLORLESS  COLORLESS  Supernatant Unknown NOT INDICATED  Total  Protein, CSF Latest Ref Range: 15 - 45 mg/dL 22  Tube # Unknown 4  Gram Stain Unknown CYTOSPIN SMEAR  NO WBC SEEN  NO ORGANISMS SEEN     Given patient's LP studies are reassuring as above, without pleocytosis, abnormal glucose or protein. EEG read pending, and will be helpful diagnostically for future management of his epilepsy, but will not change management at this time given Dr. Derry Lory has already appropriately started the patient on appropriate antiseizure medications. Patient has clinically returned to baseline, and is stable for discharge from a neurologic perspective  Brooke Dare MD-PhD [facility] [phone]

## 2021-05-27 NOTE — Progress Notes (Addendum)
Mario Davis  TDV:[id number] DOB: April 16, 1996 DOA: 05/26/2021 PCP: Janeece Agee, NP    Brief Narrative:  25 year old with no prior medical history who presented to the ED with seizure-like activity associated with preceding fever.  On initial presentation the patient was confused.  CT head was unremarkable.  LP was attempted but was unsuccessful.  Empiric antibiotics/antivirals were initiated.  Significant Events:  7/12 admit via ER  Consultants:  None  Code Status: FULL CODE  Antimicrobials:  Acyclovir 7/12 > Ceftriaxone 7/12 > Vancomycin 7/12 > Doxycycline 7/12 >  DVT prophylaxis: SCDs  Subjective: Afebrile.  Vital signs stable.  Saturation 97% room air.  Resting comfortably in bed post LP.  States procedure went well.  Has no new complaints.  Denies headache or nuchal rigidity.  Denies photophobia.  Is oriented and alert.  Assessment & Plan:  Suspected seizure Continue empiric Keppra - MRI brain unrevealing - EEG results pending   Possible meningitis/encephalitis Continue empiric broad coverage -await fluoroscopic guided LP results -CT and MRI head unrevealing - clinically much improved   Acute kidney injury Creatinine 1.2 at presentation and has improved to 0.9 with volume resuscitation  Hypokalemia Corrected with supplementation   Family Communication:  Status is: Inpatient  Remains inpatient appropriate because:Inpatient level of care appropriate due to severity of illness  Dispo: The patient is from: Home              Anticipated d/c is to: Home              Patient currently is not medically stable to d/c.   Difficult to place patient No  Objective: Blood pressure 124/69, pulse 78, temperature 98.1 F (36.7 C), temperature source Oral, resp. rate 20, height 6' (1.829 m), weight 77 kg, SpO2 97 %.  Intake/Output Summary (Last 24 hours) at 05/27/2021 1750 Last data filed at 05/27/2021 0428 Gross per 24 hour  Intake 1421.73 ml  Output --  Net  1421.73 ml    Filed Weights   05/26/21 1409  Weight: 77 kg    Examination: General: No acute respiratory distress Lungs: Clear to auscultation bilaterally without wheezes or crackles Cardiovascular: Regular rate and rhythm without murmur gallop or rub normal S1 and S2 Abdomen: Nontender, nondistended, soft, bowel sounds positive, no rebound, no ascites, no appreciable mass Extremities: No significant cyanosis, clubbing, or edema bilateral lower extremities  CBC: Recent Labs  Lab 05/26/21 1624  WBC 15.8*  NEUTROABS 13.2*  HGB 14.6  HCT 42.2  MCV 87.9  PLT 260    Basic Metabolic Panel: Recent Labs  Lab 05/26/21 1614 05/26/21 1624 05/27/21 0338 05/27/21 1041  NA  --  135 137 137  K  --  3.4* 4.5 4.7  CL  --  100 107 106  CO2  --  25 23 22   GLUCOSE  --  108* 151* 150*  BUN  --  18 14 15   CREATININE  --  1.21 0.89 0.91  CALCIUM  --  9.3 8.9 8.9  MG 2.6*  --   --  2.5*  PHOS 3.4  --   --  2.6    GFR: Estimated Creatinine Clearance: 137.5 mL/min (by C-G formula based on SCr of 0.91 mg/dL).  Liver Function Tests: Recent Labs  Lab 05/26/21 1624 05/27/21 1041  AST 49* 38  ALT 62* 52*  ALKPHOS 100 81  BILITOT 0.7 0.7  PROT 8.5* 7.3  ALBUMIN 4.3 3.8     Coagulation Profile: Recent Labs  Lab 05/26/21  1624  INR 1.0     Cardiac Enzymes: Recent Labs  Lab 05/26/21 1614  CKTOTAL 250     CBG: Recent Labs  Lab 05/26/21 1444  GLUCAP 128*     Recent Results (from the past 240 hour(s))  Resp Panel by RT-PCR (Flu A&B, Covid) Nasopharyngeal Swab     Status: None   Collection Time: 05/26/21  3:48 PM   Specimen: Nasopharyngeal Swab; Nasopharyngeal(NP) swabs in vial transport medium  Result Value Ref Range Status   SARS Coronavirus 2 by RT PCR NEGATIVE NEGATIVE Final    Comment: (NOTE) SARS-CoV-2 target nucleic acids are NOT DETECTED.  The SARS-CoV-2 RNA is generally detectable in upper respiratory specimens during the acute phase of infection. The  lowest concentration of SARS-CoV-2 viral copies this assay can detect is 138 copies/mL. A negative result does not preclude SARS-Cov-2 infection and should not be used as the sole basis for treatment or other patient management decisions. A negative result may occur with  improper specimen collection/handling, submission of specimen other than nasopharyngeal swab, presence of viral mutation(s) within the areas targeted by this assay, and inadequate number of viral copies(<138 copies/mL). A negative result must be combined with clinical observations, patient history, and epidemiological information. The expected result is Negative.  Fact Sheet for Patients:  BloggerCourse.com  Fact Sheet for Healthcare Providers:  SeriousBroker.it  This test is no t yet approved or cleared by the [place] FDA and  has been authorized for detection and/or diagnosis of SARS-CoV-2 by FDA under an Emergency Use Authorization (EUA). This EUA will remain  in effect (meaning this test can be used) for the duration of the COVID-19 declaration under Section 564(b)(1) of the Act, 21 U.S.C.section 360bbb-3(b)(1), unless the authorization is terminated  or revoked sooner.       Influenza A by PCR NEGATIVE NEGATIVE Final   Influenza B by PCR NEGATIVE NEGATIVE Final    Comment: (NOTE) The Xpert Xpress SARS-CoV-2/FLU/RSV plus assay is intended as an aid in the diagnosis of influenza from Nasopharyngeal swab specimens and should not be used as a sole basis for treatment. Nasal washings and aspirates are unacceptable for Xpert Xpress SARS-CoV-2/FLU/RSV testing.  Fact Sheet for Patients: BloggerCourse.com  Fact Sheet for Healthcare Providers: SeriousBroker.it  This test is not yet approved or cleared by the [place] FDA and has been authorized for detection and/or diagnosis of SARS-CoV-2 by FDA under  an Emergency Use Authorization (EUA). This EUA will remain in effect (meaning this test can be used) for the duration of the COVID-19 declaration under Section 564(b)(1) of the Act, 21 U.S.C. section 360bbb-3(b)(1), unless the authorization is terminated or revoked.  Performed at [facility], [address] [place], [place] [address]   Blood Culture (routine x 2)     Status: None (Preliminary result)   Collection Time: 05/26/21  4:14 PM   Specimen: BLOOD  Result Value Ref Range Status   Specimen Description   Final    BLOOD RIGHT ANTECUBITAL Performed at [facility], [address], [place], [place] [address]    Special Requests   Final    BOTTLES DRAWN AEROBIC AND ANAEROBIC Blood Culture adequate volume Performed at [facility], [address] [place], [place] [address]    Culture   Final    NO GROWTH < 12 HOURS Performed at [facility], [address] [place], [place] [address]    Report Status PENDING  Incomplete  Blood  Culture (routine x 2)     Status: None (Preliminary result)   Collection Time: 05/26/21  4:14 PM   Specimen: BLOOD  Result Value Ref Range Status   Specimen Description   Final    BLOOD LEFT ANTECUBITAL Performed at [facility], [address], [place], [place] [address]    Special Requests   Final    BOTTLES DRAWN AEROBIC AND ANAEROBIC Blood Culture adequate volume Performed at [facility], [address] [place], [place] [address]    Culture   Final    NO GROWTH < 12 HOURS Performed at [facility], [address] [place], [place] [address]    Report Status PENDING  Incomplete  MRSA Next Gen by PCR, Nasal     Status: None   Collection Time: 05/27/21  2:00 AM   Specimen: Nasal Mucosa; Nasal Swab  Result Value Ref Range Status   MRSA by PCR Next Gen NOT DETECTED NOT DETECTED Final    Comment: (NOTE) The GeneXpert MRSA Assay (FDA  approved for NASAL specimens only), is one component of a comprehensive MRSA colonization surveillance program. It is not intended to diagnose MRSA infection nor to guide or monitor treatment for MRSA infections. Test performance is not FDA approved in patients less than 68 years old. Performed at [facility], [address], [place], [place] [address]   CSF culture w Gram Stain     Status: None (Preliminary result)   Collection Time: 05/27/21  1:00 PM   Specimen: PATH Cytology CSF; Cerebrospinal Fluid  Result Value Ref Range Status   Specimen Description CSF  Final   Special Requests NONE  Final   Gram Stain   Final    CYTOSPIN SMEAR NO WBC SEEN NO ORGANISMS SEEN Gram Stain Report Called to,Read Back By and Verified With: BLAINE,S. RN @1536  ON 07.13.2022 BY COHEN,K Performed at [facility], [address] [place], [place]    Culture PENDING  Incomplete   Report Status PENDING  Incomplete      Scheduled Meds:  levETIRAcetam  500 mg Oral BID   vitamin B-12  1,000 mcg Oral Daily   Continuous Infusions:  cefTRIAXone (ROCEPHIN)  IV 2 g (05/27/21 1516)   doxycycline (VIBRAMYCIN) IV 100 mg (05/27/21 1155)   vancomycin 1,500 mg (05/27/21 1740)     LOS: 0 days   05/29/21, MD [facility] Office  [phone] Pager - Text Page per [phone]  If 7PM-7AM, please contact night-coverage per Amion 05/27/2021, 5:50 PM

## 2021-05-28 LAB — BASIC METABOLIC PANEL
Anion gap: 7 (ref 5–15)
BUN: 19 mg/dL (ref 6–20)
CO2: 24 mmol/L (ref 22–32)
Calcium: 8.7 mg/dL — ABNORMAL LOW (ref 8.9–10.3)
Chloride: 109 mmol/L (ref 98–111)
Creatinine, Ser: 0.9 mg/dL (ref 0.61–1.24)
GFR, Estimated: >60 mL/min (ref >60)
Glucose, Bld: 111 mg/dL — ABNORMAL HIGH (ref 70–99)
Potassium: 3.4 mmol/L — ABNORMAL LOW (ref 3.5–5.1)
Sodium: 140 mmol/L (ref 135–145)

## 2021-05-28 LAB — URINE CULTURE: Culture: NO GROWTH

## 2021-05-28 LAB — CBC
HCT: 37.7 % — ABNORMAL LOW (ref 39.0–52.0)
Hemoglobin: 12.9 g/dL — ABNORMAL LOW (ref 13.0–17.0)
MCH: 30.7 pg (ref 26.0–34.0)
MCHC: 34.2 g/dL (ref 30.0–36.0)
MCV: 89.8 fL (ref 80.0–100.0)
Platelets: 258 10*3/uL (ref 150–400)
RBC: 4.2 MIL/uL — ABNORMAL LOW (ref 4.22–5.81)
RDW: 13.3 % (ref 11.5–15.5)
WBC: 13.2 10*3/uL — ABNORMAL HIGH (ref 4.0–10.5)
nRBC: 0 % (ref 0.0–0.2)

## 2021-05-28 MED ORDER — LEVETIRACETAM 500 MG PO TABS: 500.0000 mg | ORAL_TABLET | Freq: Two times a day (BID) | ORAL | 1 refills | Status: DC

## 2021-05-28 NOTE — TOC Transition Note (Addendum)
Transition of Care [facility]) - CM/SW Discharge Note   Patient Details  Name: Mario Davis MRN: [id number] Date of Birth: 1996/11/07  [facility] CM/SW Contact:  Darleene Cleaver, LCSW Phone Number: 05/28/2021, 2:04 PM   Clinical Narrative:     CSW received consult that patient can not afford his medications.  Patient has [place] Medicaid prepaid plan.  CSW can not assist because he has Medicaid.  Patient also does have a PCP Janeece Agee [phone] or [phone].  CSW signing off.        Patient Goals and CMS Choice        Discharge Placement                       Discharge Plan and Services                                     Social Determinants of Health (SDOH) Interventions     Readmission Risk Interventions No flowsheet data found.

## 2021-05-28 NOTE — Discharge Summary (Signed)
DISCHARGE SUMMARY  Mario Davis  MR#: [id number]  DOB:04/15/1996  Date of Admission: 05/26/2021 Date of Discharge: 05/28/2021  Attending Physician:Tramaine Sauls Silvestre Gunner, MD  Patient's OVF:IEPPIR, Gerlene Burdock, NP  Consults: Neurology   Disposition: D/C home    Follow-up Appts:  Follow-up Information     [facility] Follow up in 2 week(s).   Contact information: [address]     [address] [place] [address] [phone]                Tests Needing Follow-up: -ask if experiencing any recurrent seizures  -assess tolerance of newly prescribed keppra  Discharge Diagnoses: Suspected newly diagnosed seizure Possible meningitis/encephalitis - ruled out  Acute kidney injury - resolved  Hypokalemia  Initial presentation: 25yo with no prior medical history who presented to the ED with seizure-like activity associated with preceding fever. On initial presentation the patient was confused. CT head was unremarkable. LP was attempted but was unsuccessful.  Empiric antibiotics/antivirals were initiated while arrangements were made for imaging guided LP.   Hospital Course:  Suspected newly diagnosed seizure Continue empiric Keppra as per Neurology - MRI brain unrevealing - EEG revealed no active/ongoing seizure activity - mental status quickly returned to normal - cleared for d/c per Neurology - educated on seizure precautions and no driving as per protocol (see d/c instructions to patient) - f/u w/ Neuro as outpt - TOC to assist as able w/ meds    Possible meningitis/encephalitis empiric broad coverage provided initially - fluoroscopic guided LP accomplished w/ no pleocytosis or abnormal protein or glucose - CT and MRI head unrevealing - clinically returned to baseline quickly - no further fever - meningitis now clinically ruled out    Acute kidney injury Creatinine 1.2 at presentation and improved to 0.9 with volume resuscitation    Hypokalemia Corrected with supplementation    Allergies as of 05/28/2021   No Known Allergies      Medication List     STOP taking these medications    meloxicam 7.5 MG tablet Commonly known as: MOBIC       TAKE these medications    levETIRAcetam 500 MG tablet Commonly known as: KEPPRA Take 1 tablet (500 mg total) by mouth 2 (two) times daily.        Day of Discharge BP (!) 111/55 (BP Location: Left Arm)   Pulse 76   Temp 98 F (36.7 C) (Oral)   Resp 16   Ht 6' (1.829 m)   Wt 77 kg   SpO2 99%   BMI 23.02 kg/m   Physical Exam: General: No acute respiratory distress Lungs: Clear to auscultation bilaterally without wheezes or crackles Cardiovascular: Regular rate and rhythm without murmur gallop or rub normal S1 and S2 Abdomen: Nontender, nondistended, soft, bowel sounds positive, no rebound, no ascites, no appreciable mass Extremities: No significant cyanosis, clubbing, or edema bilateral lower extremities  Basic Metabolic Panel: Recent Labs  Lab 05/26/21 1614 05/26/21 1624 05/27/21 0338 05/27/21 1041 05/28/21 0325  NA  --  135 137 137 140  K  --  3.4* 4.5 4.7 3.4*  CL  --  100 107 106 109  CO2  --  25 23 22 24   GLUCOSE  --  108* 151* 150* 111*  BUN  --  18 14 15 19   CREATININE  --  1.21 0.89 0.91 0.90  CALCIUM  --  9.3 8.9 8.9 8.7*  MG 2.6*  --   --  2.5*  --  PHOS 3.4  --   --  2.6  --     Liver Function Tests: Recent Labs  Lab 05/26/21 1624 05/27/21 1041  AST 49* 38  ALT 62* 52*  ALKPHOS 100 81  BILITOT 0.7 0.7  PROT 8.5* 7.3  ALBUMIN 4.3 3.8    Coags: Recent Labs  Lab 05/26/21 1624  INR 1.0    CBC: Recent Labs  Lab 05/26/21 1624 05/27/21 1041 05/28/21 0325  WBC 15.8* 18.0* 13.2*  NEUTROABS 13.2* 15.5*  --   HGB 14.6 13.0 12.9*  HCT 42.2 37.3* 37.7*  MCV 87.9 89.7 89.8  PLT 260 272 258    Cardiac Enzymes: Recent Labs  Lab 05/26/21 1614  CKTOTAL 250    CBG: Recent Labs  Lab 05/26/21 1444  GLUCAP 128*     Recent Results (from the past 240 hour(s))  Resp Panel by RT-PCR (Flu A&B, Covid) Nasopharyngeal Swab     Status: None   Collection Time: 05/26/21  3:48 PM   Specimen: Nasopharyngeal Swab; Nasopharyngeal(NP) swabs in vial transport medium  Result Value Ref Range Status   SARS Coronavirus 2 by RT PCR NEGATIVE NEGATIVE Final    Comment: (NOTE) SARS-CoV-2 target nucleic acids are NOT DETECTED.  The SARS-CoV-2 RNA is generally detectable in upper respiratory specimens during the acute phase of infection. The lowest concentration of SARS-CoV-2 viral copies this assay can detect is 138 copies/mL. A negative result does not preclude SARS-Cov-2 infection and should not be used as the sole basis for treatment or other patient management decisions. A negative result may occur with  improper specimen collection/handling, submission of specimen other than nasopharyngeal swab, presence of viral mutation(s) within the areas targeted by this assay, and inadequate number of viral copies(<138 copies/mL). A negative result must be combined with clinical observations, patient history, and epidemiological information. The expected result is Negative.  Fact Sheet for Patients:  BloggerCourse.com  Fact Sheet for Healthcare Providers:  SeriousBroker.it  This test is no t yet approved or cleared by the [place] FDA and  has been authorized for detection and/or diagnosis of SARS-CoV-2 by FDA under an Emergency Use Authorization (EUA). This EUA will remain  in effect (meaning this test can be used) for the duration of the COVID-19 declaration under Section 564(b)(1) of the Act, 21 U.S.C.section 360bbb-3(b)(1), unless the authorization is terminated  or revoked sooner.       Influenza A by PCR NEGATIVE NEGATIVE Final   Influenza B by PCR NEGATIVE NEGATIVE Final    Comment: (NOTE) The Xpert Xpress SARS-CoV-2/FLU/RSV plus assay is intended as an  aid in the diagnosis of influenza from Nasopharyngeal swab specimens and should not be used as a sole basis for treatment. Nasal washings and aspirates are unacceptable for Xpert Xpress SARS-CoV-2/FLU/RSV testing.  Fact Sheet for Patients: BloggerCourse.com  Fact Sheet for Healthcare Providers: SeriousBroker.it  This test is not yet approved or cleared by the [place] FDA and has been authorized for detection and/or diagnosis of SARS-CoV-2 by FDA under an Emergency Use Authorization (EUA). This EUA will remain in effect (meaning this test can be used) for the duration of the COVID-19 declaration under Section 564(b)(1) of the Act, 21 U.S.C. section 360bbb-3(b)(1), unless the authorization is terminated or revoked.  Performed at [facility], [address], [place], [place] [address]   Blood Culture (routine x 2)     Status: None (Preliminary result)   Collection Time: 05/26/21  4:14 PM   Specimen: BLOOD  Result Value Ref Range Status   Specimen Description   Final    BLOOD RIGHT ANTECUBITAL Performed at [facility], [address] [place], [place] [address]    Special Requests   Final    BOTTLES DRAWN AEROBIC AND ANAEROBIC Blood Culture adequate volume Performed at [facility], [address] [place], [place] [address]    Culture   Final    NO GROWTH 2 DAYS Performed at [facility], [address] [place], [place] [address]    Report Status PENDING  Incomplete  Blood Culture (routine x 2)     Status: None (Preliminary result)   Collection Time: 05/26/21  4:14 PM   Specimen: BLOOD  Result Value Ref Range Status   Specimen Description   Final    BLOOD LEFT ANTECUBITAL Performed at [facility], [address] [place], [place] [address]    Special Requests   Final    BOTTLES DRAWN AEROBIC AND ANAEROBIC Blood Culture adequate  volume Performed at [facility], [address] [place], [place] [address]    Culture   Final    NO GROWTH 2 DAYS Performed at [facility], [address] [place], [place] [address]    Report Status PENDING  Incomplete  Urine culture     Status: None   Collection Time: 05/26/21  6:30 PM   Specimen: In/Out Cath Urine  Result Value Ref Range Status   Specimen Description   Final    IN/OUT CATH URINE Performed at [facility], [address] [place], [place] [address]    Special Requests   Final    NONE Performed at [facility], [address] [place], [place] [address]    Culture   Final    NO GROWTH Performed at [facility], [address] [place], [place] [address]    Report Status 05/28/2021 FINAL  Final  MRSA Next Gen by PCR, Nasal     Status: None   Collection Time: 05/27/21  2:00 AM   Specimen: Nasal Mucosa; Nasal Swab  Result Value Ref Range Status   MRSA by PCR Next Gen NOT DETECTED NOT DETECTED Final    Comment: (NOTE) The GeneXpert MRSA Assay (FDA approved for NASAL specimens only), is one component of a comprehensive MRSA colonization surveillance program. It is not intended to diagnose MRSA infection nor to guide or monitor treatment for MRSA infections. Test performance is not FDA approved in patients less than 23 years old. Performed at [facility], [address] [place], [place] [address]   CSF culture w Gram Stain     Status: None (Preliminary result)   Collection Time: 05/27/21  1:00 PM   Specimen: PATH Cytology CSF; Cerebrospinal Fluid  Result Value Ref Range Status   Specimen Description   Final    CSF Performed at [facility], [address] [place], [place] [address]    Special Requests   Final    NONE Performed at [facility], [address] [place], [place] [address]    Gram Stain   Final    CYTOSPIN  SMEAR NO WBC SEEN NO ORGANISMS SEEN Gram Stain Report Called to,Read Back By and Verified With: BLAINE,S. RN @1536  ON 07.13.2022 BY COHEN,K Performed at [facility], [address] [place], [place] [address]    Culture   Final  NO GROWTH < 24 HOURS Performed at [facility], [address] [place], [place] [address]    Report Status PENDING  Incomplete      Time spent in discharge (includes decision making & examination of pt): 30 minutes  05/28/2021, 10:23 AM   Lonia Blood, MD [facility] Office  [phone]

## 2021-05-28 NOTE — Discharge Instructions (Addendum)
No driving for 27months! You have to be seizure free for 6 months before you can resume driving.  Seizure precautions: Per [facility] statutes, patients with seizures are not allowed to drive until they have been seizure-free for six months and cleared by a physician   Use caution when using heavy equipment or power tools. Avoid working on ladders or at heights. Take showers instead of baths. Ensure the water temperature is not too high on the home water heater. Do not go swimming alone. Do not lock yourself in a room alone (i.e. bathroom). When caring for infants or small children, sit down when holding, feeding, or changing them to minimize risk of injury to the child in the event you have a seizure. Maintain good sleep hygiene. Avoid alcohol.   If patient has another seizure, call 911 and bring them back to the ED if: A.  The seizure lasts longer than 5 minutes.      B.  The patient doesn't wake shortly after the seizure or has new problems such as difficulty seeing, speaking or moving following the seizure C.  The patient was injured during the seizure D.  The patient has a temperature over 102 F (39C) E.  The patient vomited during the seizure and now is having trouble breathing    During the Seizure   - First, ensure adequate ventilation and place patients on the floor on their left side Loosen clothing around the neck and ensure the airway is patent. If the patient is clenching the teeth, do not force the mouth open with any object as this can cause severe damage - Remove all items from the surrounding that can be hazardous. The patient may be oblivious to what's happening and may not even know what he or she is doing. If the patient is confused and wandering, either gently guide him/her away and block access to outside areas - Reassure the individual and be comforting - Call 911. In most cases, the seizure ends before EMS arrives. However, there are cases when seizures may last  over 3 to 5 minutes. Or the individual may have developed breathing difficulties or severe injuries. If a pregnant patient or a person with diabetes develops a seizure, it is prudent to call an ambulance. - Finally, if the patient does not regain full consciousness, then call EMS. Most patients will remain confused for about 45 to 90 minutes after a seizure, so you must use judgment in calling for help. - Avoid restraints but make sure the patient is in a bed with padded side rails - Place the individual in a lateral position with the neck slightly flexed; this will help the saliva drain from the mouth and prevent the tongue from falling backward - Remove all nearby furniture and other hazards from the area - Provide verbal assurance as the individual is regaining consciousness - Provide the patient with privacy if possible - Call for help and start treatment as ordered by the caregiver    After the Seizure (Postictal Stage)   After a seizure, most patients experience confusion, fatigue, muscle pain and/or a headache. Thus, one should permit the individual to sleep. For the next few days, reassurance is essential. Being calm and helping reorient the person is also of importance.   Most seizures are painless and end spontaneously. Seizures are not harmful to others but can lead to complications such as stress on the lungs, brain and the heart. Individuals with prior lung problems may develop labored  breathing and respiratory distress.

## 2021-05-28 NOTE — Plan of Care (Signed)
  Problem: Safety: Goal: Ability to remain free from injury will improve Outcome: Adequate for Discharge   Problem: Clinical Measurements: Goal: Ability to maintain clinical measurements within normal limits will improve Outcome: Adequate for Discharge   Problem: Clinical Measurements: Goal: Diagnostic test results will improve Outcome: Adequate for Discharge

## 2021-05-31 LAB — CULTURE, BLOOD (ROUTINE X 2)
Culture: NO GROWTH
Culture: NO GROWTH
Special Requests: ADEQUATE
Special Requests: ADEQUATE

## 2021-05-31 LAB — CSF CULTURE W GRAM STAIN: Culture: NO GROWTH

## 2021-06-11 ENCOUNTER — Ambulatory Visit: Payer: Medicaid Other | Admitting: Neurology

## 2021-06-11 ENCOUNTER — Encounter: Payer: Self-pay | Admitting: Neurology

## 2021-06-11 VITALS — BP 116/72 | HR 81 | Ht 72.0 in | Wt 173.5 lb

## 2021-06-11 DIAGNOSIS — G40909 Epilepsy, unspecified, not intractable, without status epilepticus: Secondary | ICD-10-CM

## 2021-06-11 MED ORDER — LAMOTRIGINE 25 MG PO TABS: 25.0000 mg | ORAL_TABLET | Freq: Every day | ORAL | 0 refills | Status: DC

## 2021-06-11 NOTE — Progress Notes (Signed)
[facility]  PATIENT: Mario Davis DOB: 08-15-1996  REFERRING CLINICIAN: Leda GauzeKirby-Graham, Karen J, * HISTORY FROM: Patient and Ivan AnchorsFiancee [name]  REASON FOR VISIT: New onset seizure   HISTORICAL  CHIEF COMPLAINT:  Chief Complaint  Patient presents with   New Patient (Initial Visit)    New patient: Had a seizure for the first time 2 weeks ago, no prior hx.  Keppra from ED Room 13, friend Azerba[place]Ariana in room    HISTORY OF PRESENT ILLNESS:  This is a 25 year old man with no past medical history who is presenting with new onset seizure.  Patient reported 2 weeks ago he had his first lifetime seizure.  Per girlfriend who accompanied patient today she reported patient was asleep and she noted that he was staring at the ceiling, then followed by left face twitching followed by generalized body convulsions.  The entire episode lasted about 2 to 3-minute, after which he was very confused and combative.  He did not remember where he was, he wanted to leave the house and it took him about 30 minutes to regain consciousness.  He was taken to a local ED and admitted for additional work-up.  Initial lab work-up was unrevealing.  He did have a normal MRI of the brain.  He also have a routine EEG which was normal.  He was started on Keppra and discharged home.  Patient stated since starting the Keppra, he has been having headaches and pain behind eyes and he noted also that blood in the stool.  He also reported he was having some mood changes, he became angry for no reason.  He has discontinued the Keppra 4 days ago. He has a family history of seizures mother and uncle had seizures.  He denies any previous history of head trauma no other seizure risk factors noted. Since being discharged only ED he has not had any additional seizure.  He is currently not driving.  She understand that he is not supposed to drive for at least 6 months.  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  He continues to play but has not had any additional incident  Currently he denies any headaches no change in vision no abdominal pain no other complaint.   One additional issue that he brought up is chest pain 4 months ago, described as could not breathe, happened every other night, seen his PMD, had a EKG and told that everything is fine.   REVIEW OF SYSTEMS: Full 14 system review of systems performed and negative with exception of: except as noted in the HPI  ALLERGIES: No Known Allergies  HOME MEDICATIONS: Outpatient Medications Prior to Visit  Medication Sig Dispense Refill   levETIRAcetam (KEPPRA) 500 MG tablet Take 1 tablet (500 mg total) by mouth 2 (two) times daily. 60 tablet 1   No facility-administered medications prior to visit.    PAST MEDICAL HISTORY: Past Medical History:  Diagnosis Date   Seizures (HCC)     PAST SURGICAL HISTORY: History reviewed. No pertinent surgical history.  FAMILY HISTORY: Family History  Problem Relation Age of Onset   Seizures Mother    Hypertension Father     SOCIAL HISTORY: Social History   Socioeconomic History   Marital status: Significant  Other    Spouse name: Not on file   Number of children: 0   Years of education: Not on file   Highest education level: Not on file  Occupational History   Not on file  Tobacco Use   Smoking status: Never   Smokeless tobacco: Never  Vaping Use   Vaping Use: Never used  Substance and Sexual Activity   Alcohol use: Yes   Drug use: No   Sexual activity: Not on file  Other Topics Concern   Not on file  Social History Narrative   Lives with girlfriend [name]   Right Handed   Drinks 6-8 cups caffeine daily   Social Determinants of Health   Financial Resource Strain: Not on  file  Food Insecurity: Not on file  Transportation Needs: Not on file  Physical Activity: Not on file  Stress: Not on file  Social Connections: Not on file  Intimate Partner Violence: Not on file     PHYSICAL EXAM  GENERAL EXAM/CONSTITUTIONAL: Vitals:  Vitals:   06/11/21 0940  BP: 116/72  Pulse: 81  Weight: 173 lb 8 oz (78.7 kg)  Height: 6' (1.829 m)   Body mass index is 23.53 kg/m. Wt Readings from Last 3 Encounters:  06/11/21 173 lb 8 oz (78.7 kg)  05/26/21 169 lb 12.1 oz (77 kg)  01/19/21 168 lb (76.2 kg)   Patient is in no distress; well developed, nourished and groomed; neck is supple  CARDIOVASCULAR: Examination of carotid arteries is normal; no carotid bruits Regular rate and rhythm, no murmurs Examination of peripheral vascular system by observation and palpation is normal  EYES: Pupils round and reactive to light, Visual fields full to confrontation, Extraocular movements intacts,   MUSCULOSKELETAL: Gait, strength, tone, movements noted in Neurologic exam below  NEUROLOGIC: MENTAL STATUS:  No flowsheet data found. awake, alert, oriented to person, place and time recent and remote memory intact normal attention and concentration language fluent, comprehension intact, naming intact fund of knowledge appropriate  CRANIAL NERVE:  2nd, 3rd, 4th, 6th - pupils equal and reactive to light, visual fields full to confrontation, extraocular muscles intact, no nystagmus 5th - facial sensation symmetric 7th - facial strength symmetric 8th - hearing intact 9th - palate elevates symmetrically, uvula midline 11th - shoulder shrug symmetric 12th - tongue protrusion midline  MOTOR:  normal bulk and tone, full strength in the BUE, BLE  SENSORY:  normal and symmetric to light touch, pinprick, temperature, vibration  COORDINATION:  finger-nose-finger, fine finger movements normal  REFLEXES:  deep tendon reflexes present and symmetric  GAIT/STATION:   normal     DIAGNOSTIC DATA (LABS, IMAGING, TESTING) - I reviewed patient records, labs, notes, testing and imaging myself where available.  Lab Results  Component Value Date   WBC 13.2 (H) 05/28/2021   HGB 12.9 (L) 05/28/2021   HCT 37.7 (L) 05/28/2021   MCV 89.8 05/28/2021   PLT 258 05/28/2021      Component Value Date/Time   NA 140 05/28/2021 0325   K 3.4 (L) 05/28/2021 0325   CL 109 05/28/2021 0325   CO2 24 05/28/2021 0325   GLUCOSE 111 (H) 05/28/2021 0325   BUN 19 05/28/2021 0325   CREATININE 0.90 05/28/2021 0325   CALCIUM 8.7 (L) 05/28/2021 0325   PROT 7.3 05/27/2021 1041   ALBUMIN 3.8 05/27/2021 1041   AST 38 05/27/2021 1041   ALT 52 (H) 05/27/2021 1041   ALKPHOS 81 05/27/2021 1041   BILITOT 0.7 05/27/2021 1041  GFRNONAA >60 05/28/2021 0325   GFRAA >60 11/21/2017 2155   No results found for: CHOL, HDL, LDLCALC, LDLDIRECT, TRIG, CHOLHDL No results found for: XLKG4W Lab Results  Component Value Date   VITAMINB12 318 05/27/2021   Lab Results  Component Value Date   TSH 0.368 05/27/2021    MRI Brain: Reviewed, within normal limits   Routine EEG: Normal routine EEG    ASSESSMENT AND PLAN  25 y.o. year old male here with first lifetime seizure.  His initial work-up included MRI and routine EEG were normal.  He was discharged home with Keppra but not covering the medication.  He did have side effects of headaches and mood changes He self discontinued his Keppra 4 days ago.  And since leaving the hospital he has not had any seizures.  I will start him on Lamotrigine.  We also discussed about the [place] laws regarding driving.  He understand that he should not drive for at least 6 months.  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  I recommended that he discuss with his employer to find a different job duties.  We also discussed about on seizure precaution. I will see him in 6 to  8 weeks at that time we will obtain a lamotrigine level.    1. Seizure disorder (HCC)       PLAN: Stop the Levetiracetam  Start Lamotrigine   25mg  daily for one week   25mg  twice a day for one week   25mg  in the morning and 50 mg at night for one week   50mg  twice a day for one week   75 mg twice a day for one week   Then 100mg  twice a day for one week   Then return to clinic in 6 to 8 to check a level and for follow up. At that time, we will switch the pill form to 100 mg tablets.  Advise patient to contact me if he has another seizure and/or if he develops any rash. He and fiancee voice understanding.   --------------------------------------------   Per statutes, patients with seizures are not allowed to drive until they have been seizure-free for six months.  Other recommendations include using caution when using heavy equipment or power tools. Avoid working on ladders or at heights. Take showers instead of baths.  Do not swim alone.  Ensure the water temperature is not too high on the home water heater. Do not go swimming alone. Do not lock yourself in a room alone (i.e. bathroom). When caring for infants or small children, sit down when holding, feeding, or changing them to minimize risk of injury to the child in the event you have a seizure. Maintain good sleep hygiene. Avoid alcohol.  Also recommend adequate sleep, hydration, good diet and minimize stress.   During the Seizure  - First, ensure adequate ventilation and place patients on the floor on their left side  Loosen clothing around the neck and ensure the airway is patent. If the patient is clenching the teeth, do not force the mouth open with any object as this can cause severe damage - Remove all items from the surrounding that can be hazardous. The patient may be oblivious to what's happening and may not even know what he or she is doing. If the patient is confused and wandering, either gently  guide him/her away and block access to  outside areas - Reassure the individual and be comforting - Call 911. In most cases, the seizure ends before EMS arrives. However, there are cases when seizures may last over 3 to 5 minutes. Or the individual may have developed breathing difficulties or severe injuries. If a pregnant patient or a person with diabetes develops a seizure, it is prudent to call an ambulance. - Finally, if the patient does not regain full consciousness, then call EMS. Most patients will remain confused for about 45 to 90 minutes after a seizure, so you must use judgment in calling for help. - Avoid restraints but make sure the patient is in a bed with padded side rails - Place the individual in a lateral position with the neck slightly flexed; this will help the saliva drain from the mouth and prevent the tongue from falling backward - Remove all nearby furniture and other hazards from the area - Provide verbal assurance as the individual is regaining consciousness - Provide the patient with privacy if possible - Call for help and start treatment as ordered by the caregiver   After the Seizure (Postictal Stage)  After a seizure, most patients experience confusion, fatigue, muscle pain and/or a headache. Thus, one should permit the individual to sleep. For the next few days, reassurance is essential. Being calm and helping reorient the person is also of importance.  Most seizures are painless and end spontaneously. Seizures are not harmful to others but can lead to complications such as stress on the lungs, brain and the heart. Individuals with prior lung problems may develop labored breathing and respiratory distress.     No orders of the defined types were placed in this encounter.   Meds ordered this encounter  Medications   lamoTRIgine (LAMICTAL) 25 MG tablet    Sig: Take 1 tablet (25 mg total) by mouth daily for 90 doses.    Dispense:  90 tablet    Refill:  0     Return in about 6 weeks (around 07/23/2021).    Windell Norfolk, MD 06/11/2021, 10:52 AM  [facility] [address] [place], [place] [address] [phone]

## 2021-06-11 NOTE — Patient Instructions (Signed)
Stop the Levetiracetam  Start Lamotrigine   25mg  daily for one week   25mg  twice a day for one week   25mg  in the morning and 50 mg at night for one week   50mg  twice a day for one week   75 mg twice a day for one week   Then 100mg  twice a day for one week   Then return to clinic in 6 to 8 to check a level and for follow up. At that time, we will switch the pill form to 100 mg tablets.    Per [facility] statutes, patients with seizures are not allowed to drive until they have been seizure-free for six months.  Other recommendations include using caution when using heavy equipment or power tools. Avoid working on ladders or at heights. Take showers instead of baths.  Do not swim alone.  Ensure the water temperature is not too high on the home water heater. Do not go swimming alone. Do not lock yourself in a room alone (i.e. bathroom). When caring for infants or small children, sit down when holding, feeding, or changing them to minimize risk of injury to the child in the event you have a seizure. Maintain good sleep hygiene. Avoid alcohol.  Also recommend adequate sleep, hydration, good diet and minimize stress.   During the Seizure  - First, ensure adequate ventilation and place patients on the floor on their left side  Loosen clothing around the neck and ensure the airway is patent. If the patient is clenching the teeth, do not force the mouth open with any object as this can cause severe damage - Remove all items from the surrounding that can be hazardous. The patient may be oblivious to what's happening and may not even know what he or she is doing. If the patient is confused and wandering, either gently guide him/her away and block access to outside areas - Reassure the individual and be comforting - Call 911. In most cases, the seizure ends before EMS arrives. However, there are cases when seizures may last over 3 to 5 minutes. Or the individual may have developed breathing  difficulties or severe injuries. If a pregnant patient or a person with diabetes develops a seizure, it is prudent to call an ambulance. - Finally, if the patient does not regain full consciousness, then call EMS. Most patients will remain confused for about 45 to 90 minutes after a seizure, so you must use judgment in calling for help. - Avoid restraints but make sure the patient is in a bed with padded side rails - Place the individual in a lateral position with the neck slightly flexed; this will help the saliva drain from the mouth and prevent the tongue from falling backward - Remove all nearby furniture and other hazards from the area - Provide verbal assurance as the individual is regaining consciousness - Provide the patient with privacy if possible - Call for help and start treatment as ordered by the caregiver   After the Seizure (Postictal Stage)  After a seizure, most patients experience confusion, fatigue, muscle pain and/or a headache. Thus, one should permit the individual to sleep. For the next few days, reassurance is essential. Being calm and helping reorient the person is also of importance.  Most seizures are painless and end spontaneously. Seizures are not harmful to others but can lead to complications such as stress on the lungs, brain and the heart. Individuals with prior lung problems may develop labored breathing  and respiratory distress.

## 2021-06-19 ENCOUNTER — Ambulatory Visit: Payer: Medicaid Other | Admitting: Neurology

## 2021-06-30 ENCOUNTER — Telehealth: Payer: Self-pay | Admitting: Neurology

## 2021-06-30 NOTE — Telephone Encounter (Signed)
Pt scheduled for f/u on 08/17/21, reminder mailed/mychart msg sent.

## 2021-07-13 ENCOUNTER — Other Ambulatory Visit: Payer: Self-pay | Admitting: Neurology

## 2021-07-14 ENCOUNTER — Other Ambulatory Visit: Payer: Self-pay

## 2021-07-14 MED ORDER — LAMOTRIGINE 25 MG PO TABS: 25.0000 mg | ORAL_TABLET | Freq: Every day | ORAL | 0 refills | Status: DC

## 2021-08-06 ENCOUNTER — Other Ambulatory Visit: Payer: Self-pay | Admitting: Neurology

## 2021-08-06 ENCOUNTER — Telehealth: Payer: Self-pay | Admitting: Neurology

## 2021-08-06 MED ORDER — LAMOTRIGINE 25 MG PO TABS: ORAL_TABLET | ORAL | 0 refills | Status: DC

## 2021-08-06 NOTE — Telephone Encounter (Signed)
I spoke the patient has only been taking lamotrigine 25mg , four tablet once in the morning. He never added the evening dose.   Dr. is out of the office. Per vo by Dr. Teresa Coombs, patient should continue taking the four tabs in the am. She will authorize a new 25mg  rx and have him titrate up the evening dose.   The patient verbalized understanding and will keep his pending appt on 08/17/21.

## 2021-08-06 NOTE — Telephone Encounter (Signed)
Pt is requesting a refill for lamoTRIgine (LAMICTAL) 25 MG tablet.  Pharmacy: [facility] [phone]

## 2021-08-17 ENCOUNTER — Encounter: Payer: Self-pay | Admitting: Neurology

## 2021-08-17 ENCOUNTER — Ambulatory Visit: Payer: Medicaid Other | Admitting: Neurology

## 2021-08-17 VITALS — BP 118/64 | HR 64 | Ht 72.0 in | Wt 179.5 lb

## 2021-08-17 DIAGNOSIS — G40909 Epilepsy, unspecified, not intractable, without status epilepticus: Secondary | ICD-10-CM

## 2021-08-17 MED ORDER — LAMOTRIGINE 100 MG PO TABS: 100.0000 mg | ORAL_TABLET | Freq: Two times a day (BID) | ORAL | 12 refills | Status: DC

## 2021-08-17 NOTE — Progress Notes (Signed)
[facility]  PATIENT: Mario Davis DOB: 10-11-1996  REFERRING CLINICIAN: Janeece Agee, NP HISTORY FROM: Patient and [name]  REASON FOR VISIT: New onset seizure   HISTORICAL  CHIEF COMPLAINT:  Chief Complaint  Patient presents with   Follow-up    Room 12 w/ girlfriend. No recent seizure activity. He is taking lamotrigine 25mg , four tablets each morning. He is still titrating his evening dose - currently taking one tablet in the evening.     Interval History 08/17/2021:  Patient presented for follow-up, last visit was July 28, at that time he was started on lamotrigine 25 mg with plan to uptitrate until 100 mg twice a day.  For some reason, patient is currently taking 100 mg in the morning and 25 mg at night.  He said that he was not aware that his evening dose was supposed to be 100 mg.  He denies any seizures since last visit, denies any new rash but states that lamotrigine is keeping him up at night.  He takes his evening dose around 8 PM.  No other complaints.  He is currently not working.   HISTORY OF PRESENT ILLNESS:  This is a 25 year old man with no past medical history who is presenting with new onset seizure.  Patient reported 2 weeks ago he had his first lifetime seizure.  Per girlfriend who accompanied patient today she reported patient was asleep and she noted that he was staring at the ceiling, then followed by left face twitching followed by generalized body convulsions.  The entire episode lasted about 2 to 3-minute, after which he was very confused and combative.  He did not remember where he was, he wanted to leave the house and it took him about 30 minutes to regain consciousness.  He was taken to a local ED and admitted for additional work-up.  Initial lab work-up was unrevealing.  He did have a normal MRI of the brain.  He also have a routine EEG which was normal.  He was started on Keppra and discharged home.  Patient stated  since starting the Keppra, he has been having headaches and pain behind eyes and he noted also that blood in the stool.  He also reported he was having some mood changes, he became angry for no reason.  He has discontinued the Keppra 4 days ago. He has a family history of seizures mother and uncle had seizures.  He denies any previous history of head trauma no other seizure risk factors noted. Since being discharged only ED he has not had any additional seizure.  He is currently not driving.  She understand that he is not supposed to drive for at least 6 months.  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  He continues to play but has not had any additional incident  Currently he denies any headaches no change in vision no abdominal pain no other complaint.   One additional issue that he brought up is chest pain 4 months ago, described as could not breathe, happened every other night, seen his PMD, had a EKG and told that everything is fine.   REVIEW OF SYSTEMS: Full 14 system review of systems performed and negative with exception of: except as noted in  the HPI  ALLERGIES: No Known Allergies  HOME MEDICATIONS: Outpatient Medications Prior to Visit  Medication Sig Dispense Refill   lamoTRIgine (LAMICTAL) 25 MG tablet Continue taking 4 tabs each morning.New titration for evening dose.25mg  QHS x 1 wk, then 50mg  QHS x 1 wk, 75mg  QHS x 1 wk, then should be taking 100mg  BID. 100 tablet 0   No facility-administered medications prior to visit.    PAST MEDICAL HISTORY: Past Medical History:  Diagnosis Date   Seizures (HCC)     PAST SURGICAL HISTORY: History reviewed. No pertinent surgical history.  FAMILY HISTORY: Family History  Problem Relation Age of Onset   Seizures Mother     Hypertension Father     SOCIAL HISTORY: Social History   Socioeconomic History   Marital status: Significant Other    Spouse name: Not on file   Number of children: 0   Years of education: Not on file   Highest education level: Not on file  Occupational History   Not on file  Tobacco Use   Smoking status: Never   Smokeless tobacco: Never  Vaping Use   Vaping Use: Never used  Substance and Sexual Activity   Alcohol use: Yes   Drug use: No   Sexual activity: Not on file  Other Topics Concern   Not on file  Social History Narrative   Lives with girlfriend [name]   Right Handed   Drinks 6-8 cups caffeine daily   Social Determinants of Health   Financial Resource Strain: Not on file  Food Insecurity: Not on file  Transportation Needs: Not on file  Physical Activity: Not on file  Stress: Not on file  Social Connections: Not on file  Intimate Partner Violence: Not on file     PHYSICAL EXAM  GENERAL EXAM/CONSTITUTIONAL: Vitals:  Vitals:   08/17/21 1049  BP: 118/64  Pulse: 64  Weight: 179 lb 8 oz (81.4 kg)  Height: 6' (1.829 m)   Body mass index is 24.34 kg/m. Wt Readings from Last 3 Encounters:  08/17/21 179 lb 8 oz (81.4 kg)  06/11/21 173 lb 8 oz (78.7 kg)  05/26/21 169 lb 12.1 oz (77 kg)   Patient is in no distress; well developed, nourished and groomed; neck is supple    NEUROLOGIC: MENTAL STATUS:  No flowsheet data found. awake, alert, oriented to person, place and time recent and remote memory intact normal attention and concentration language fluent, comprehension intact, naming intact fund of knowledge appropriate  CRANIAL NERVE:  2nd, 3rd, 4th, 6th - pupils equal and reactive to light, visual fields full to confrontation, extraocular muscles intact, no nystagmus 5th - facial sensation symmetric 7th - facial strength symmetric 8th - hearing intact 9th - palate elevates symmetrically, uvula midline 11th - shoulder shrug symmetric 12th -  tongue protrusion midline    GAIT/STATION:  normal   DIAGNOSTIC DATA (LABS, IMAGING, TESTING) - I reviewed patient records, labs, notes, testing and imaging myself where available.  Lab Results  Component Value Date   WBC 13.2 (H) 05/28/2021   HGB 12.9 (L) 05/28/2021   HCT 37.7 (L) 05/28/2021   MCV 89.8 05/28/2021   PLT 258 05/28/2021      Component Value Date/Time   NA 140 05/28/2021 0325   K 3.4 (L) 05/28/2021 0325   CL 109 05/28/2021 0325   CO2 24 05/28/2021 0325   GLUCOSE 111 (H) 05/28/2021 0325   BUN 19 05/28/2021 0325   CREATININE 0.90 05/28/2021 0325   CALCIUM 8.7 (L)  05/28/2021 0325   PROT 7.3 05/27/2021 1041   ALBUMIN 3.8 05/27/2021 1041   AST 38 05/27/2021 1041   ALT 52 (H) 05/27/2021 1041   ALKPHOS 81 05/27/2021 1041   BILITOT 0.7 05/27/2021 1041   GFRNONAA >60 05/28/2021 0325   GFRAA >60 11/21/2017 2155   No results found for: CHOL, HDL, LDLCALC, LDLDIRECT, TRIG, CHOLHDL No results found for: [id number] Lab Results  Component Value Date   VITAMINB12 318 05/27/2021   Lab Results  Component Value Date   TSH 0.368 05/27/2021    MRI Brain: Reviewed, within normal limits   Routine EEG: Normal routine EEG    ASSESSMENT AND PLAN  25 y.o. year old male here for follow-up for his seizures.  He reports compliance with his medication, unfortunately he has been taking lamotrigine 100 mg in the morning and 25 mg at nighttime.  Advised patient to increase his evening dose by 25 mg weekly with a goal to take 100 mg twice a day.  A new prescription of Lamotrigine was sent to the patient.  I will check his lamotrigine level and sodium level today.  I will see him in 3 months.  He reports currently that he is not working and he understands not to drive for a total of 6 months.    1. Seizure disorder (HCC)      PLAN: We will Lamictal level today  Continue with Lamotrigine as below   Week 1: Continue with 100 mg in the morning and 50 mg (2 tabs) at night  Week  2: Continue with 100 mg in the morning and 75 mg (3 tabs) at night   Week 3: Continue with 100 mg in the morning and 100 mg (4 tabs) at night   Return to clinic in 3 months   --------------------------------------------   Per [facility] statutes, patients with seizures are not allowed to drive until they have been seizure-free for six months.  Other recommendations include using caution when using heavy equipment or power tools. Avoid working on ladders or at heights. Take showers instead of baths.  Do not swim alone.  Ensure the water temperature is not too high on the home water heater. Do not go swimming alone. Do not lock yourself in a room alone (i.e. bathroom). When caring for infants or small children, sit down when holding, feeding, or changing them to minimize risk of injury to the child in the event you have a seizure. Maintain good sleep hygiene. Avoid alcohol.  Also recommend adequate sleep, hydration, good diet and minimize stress.   During the Seizure  - First, ensure adequate ventilation and place patients on the floor on their left side  Loosen clothing around the neck and ensure the airway is patent. If the patient is clenching the teeth, do not force the mouth open with any object as this can cause severe damage - Remove all items from the surrounding that can be hazardous. The patient may be oblivious to what's happening and may not even know what he or she is doing. If the patient is confused and wandering, either gently guide him/her away and block access to outside areas - Reassure the individual and be comforting - Call 911. In most cases, the seizure ends before EMS arrives. However, there are cases when seizures may last over 3 to 5 minutes. Or the individual may have developed breathing difficulties or severe injuries. If a pregnant patient or a person with diabetes develops a seizure, it is  prudent to call an ambulance. - Finally, if the patient does not regain  full consciousness, then call EMS. Most patients will remain confused for about 45 to 90 minutes after a seizure, so you must use judgment in calling for help. - Avoid restraints but make sure the patient is in a bed with padded side rails - Place the individual in a lateral position with the neck slightly flexed; this will help the saliva drain from the mouth and prevent the tongue from falling backward - Remove all nearby furniture and other hazards from the area - Provide verbal assurance as the individual is regaining consciousness - Provide the patient with privacy if possible - Call for help and start treatment as ordered by the caregiver   After the Seizure (Postictal Stage)  After a seizure, most patients experience confusion, fatigue, muscle pain and/or a headache. Thus, one should permit the individual to sleep. For the next few days, reassurance is essential. Being calm and helping reorient the person is also of importance.  Most seizures are painless and end spontaneously. Seizures are not harmful to others but can lead to complications such as stress on the lungs, brain and the heart. Individuals with prior lung problems may develop labored breathing and respiratory distress.     Orders Placed This Encounter  Procedures   Lamotrigine level   Basic Metabolic Panel     Meds ordered this encounter  Medications   lamoTRIgine (LAMICTAL) 100 MG tablet    Sig: Take 1 tablet (100 mg total) by mouth 2 (two) times daily.    Dispense:  60 tablet    Refill:  12     Return in about 3 months (around 11/17/2021).    Windell Norfolk, MD 08/17/2021, 11:17 AM  [facility] [address], [address] [place], [place] [address] [phone]

## 2021-08-17 NOTE — Patient Instructions (Signed)
We will Lamictal level today  Continue with Lamotrigine as below   Week 1: Continue with 100 mg in the morning and 50 mg (2 tabs) at night  Week 2: Continue with 100 mg in the morning and 75 mg (3 tabs) at night   Week 3: Continue with 100 mg in the morning and 100 mg (4 tabs) at night   Return to clinic in 3 months

## 2021-08-18 LAB — BASIC METABOLIC PANEL
BUN/Creatinine Ratio: 14 (ref 9–20)
BUN: 15 mg/dL (ref 6–20)
CO2: 22 mmol/L (ref 20–29)
Calcium: 9.6 mg/dL (ref 8.7–10.2)
Chloride: 100 mmol/L (ref 96–106)
Creatinine, Ser: 1.07 mg/dL (ref 0.76–1.27)
Glucose: 84 mg/dL (ref 70–99)
Potassium: 4.1 mmol/L (ref 3.5–5.2)
Sodium: 137 mmol/L (ref 134–144)
eGFR: 100 mL/min/{1.73_m2} (ref >59)

## 2021-08-18 LAB — LAMOTRIGINE LEVEL: Lamotrigine Lvl: 1.9 ug/mL — ABNORMAL LOW (ref 2.0–20.0)

## 2021-09-03 ENCOUNTER — Other Ambulatory Visit: Payer: Self-pay | Admitting: Neurology

## 2021-11-19 ENCOUNTER — Encounter: Payer: Self-pay | Admitting: Neurology

## 2021-11-19 ENCOUNTER — Ambulatory Visit: Payer: Medicaid Other | Admitting: Neurology

## 2021-11-19 VITALS — BP 115/71 | HR 90 | Ht 72.0 in | Wt 187.0 lb

## 2021-11-19 DIAGNOSIS — Z5181 Encounter for therapeutic drug level monitoring: Secondary | ICD-10-CM | POA: Diagnosis not present

## 2021-11-19 DIAGNOSIS — G40909 Epilepsy, unspecified, not intractable, without status epilepticus: Secondary | ICD-10-CM

## 2021-11-19 NOTE — Progress Notes (Signed)
[facility]  PATIENT: Mario Davis DOB: 08/23/1996  REFERRING CLINICIAN: Janeece Davis, Richard, NP HISTORY FROM: Patient and Mario Davis Mario Davis  REASON FOR VISIT: New onset seizure   HISTORICAL  CHIEF COMPLAINT:  Chief Complaint  Patient presents with   Follow-up    Rm 15. Alone. Patient is taking lamotrigine 100 mg in the morning and 100 mg in the evening. No concerns.    INTERVAL HISTORY 11/19/2021:  Patient presents today for follow-up, last visit was on October 3.  At that time plan was to increase Lamictal 200 mg twice daily.  He reported compliance with medication, denies any seizures since last visit, denies any side effect from the medication.  No other concerns or complaints.  Interval History 08/17/2021:  Patient presented for follow-up, last visit was July 28, at that time he was started on lamotrigine 25 mg with plan to uptitrate until 100 mg twice a day.  For some reason, patient is currently taking 100 mg in the morning and 25 mg at night.  He said that he was not aware that his evening dose was supposed to be 100 mg.  He denies any seizures since last visit, denies any new rash but states that lamotrigine is keeping him up at night.  He takes his evening dose around 8 PM.  No other complaints.  He is currently not working.   HISTORY OF PRESENT ILLNESS:  This is a 26 year old man with no past medical history who is presenting with new onset seizure.  Patient reported 2 weeks ago he had his first lifetime seizure.  Per girlfriend who accompanied patient today she reported patient was asleep and she noted that he was staring at the ceiling, then followed by left face twitching followed by generalized body convulsions.  The entire episode lasted about 2 to 3-minute, after which he was very confused and combative.  He did not remember where he was, he wanted to leave the house and it took him about 30 minutes to regain consciousness.  He was taken to a  local ED and admitted for additional work-up.  Initial lab work-up was unrevealing.  He did have a normal MRI of the brain.  He also have a routine EEG which was normal.  He was started on Keppra and discharged home.  Patient stated since starting the Keppra, he has been having headaches and pain behind eyes and he noted also that blood in the stool.  He also reported he was having some mood changes, he became angry for no reason.  He has discontinued the Keppra 4 days ago. He has a family history of seizures mother and uncle had seizures.  He denies any previous history of head trauma no other seizure risk factors noted. Since being discharged only ED he has not had any additional seizure.  He is currently not driving.  She understand that he is not supposed to drive for at least 6 months.  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  He continues to play but has not had any additional incident  Currently he denies any headaches no change in vision no abdominal pain no other complaint.   One additional issue that he brought up is chest pain 4 months ago, described  as could not breathe, happened every other night, seen his PMD, had a EKG and told that everything is fine.   REVIEW OF SYSTEMS: Full 14 system review of systems performed and negative with exception of: except as noted in the HPI  ALLERGIES: No Known Allergies  HOME MEDICATIONS: Outpatient Medications Prior to Visit  Medication Sig Dispense Refill   lamoTRIgine (LAMICTAL) 100 MG tablet Take 1 tablet (100 mg total) by mouth 2 (two) times daily. 60 tablet 12   No facility-administered medications prior to visit.    PAST MEDICAL HISTORY: Past Medical History:  Diagnosis Date   Seizures (HCC)     PAST SURGICAL  HISTORY: History reviewed. No pertinent surgical history.  FAMILY HISTORY: Family History  Problem Relation Age of Onset   Seizures Mother    Hypertension Father     SOCIAL HISTORY: Social History   Socioeconomic History   Marital status: Significant Other    Spouse name: Not on file   Number of children: 0   Years of education: Not on file   Highest education level: Not on file  Occupational History   Not on file  Tobacco Use   Smoking status: Never   Smokeless tobacco: Never  Vaping Use   Vaping Use: Never used  Substance and Sexual Activity   Alcohol use: Yes   Drug use: No   Sexual activity: Not on file  Other Topics Concern   Not on file  Social History Narrative   Lives with girlfriend Mario Davis   Right Handed   Drinks 6-8 cups caffeine daily   Social Determinants of Health   Financial Resource Strain: Not on file  Food Insecurity: Not on file  Transportation Needs: Not on file  Physical Activity: Not on file  Stress: Not on file  Social Connections: Not on file  Intimate Partner Violence: Not on file     PHYSICAL EXAM  GENERAL EXAM/CONSTITUTIONAL: Vitals:  Vitals:   11/19/21 1113  BP: 115/71  Pulse: 90  Weight: 187 lb (84.8 kg)  Height: 6' (1.829 m)   Body mass index is 25.36 kg/m. Wt Readings from Last 3 Encounters:  11/19/21 187 lb (84.8 kg)  08/17/21 179 lb 8 oz (81.4 kg)  06/11/21 173 lb 8 oz (78.7 kg)   Patient is in no distress; well developed, nourished and groomed; neck is supple   NEUROLOGIC: MENTAL STATUS:  No flowsheet data found. awake, alert, oriented to person, place and time recent and remote memory intact normal attention and concentration language fluent, comprehension intact, naming intact fund of knowledge appropriate  CRANIAL NERVE:  2nd, 3rd, 4th, 6th - pupils equal and reactive to light, visual fields full to confrontation, extraocular muscles intact, no nystagmus 5th - facial sensation symmetric 7th - facial  strength symmetric 8th - hearing intact 9th - palate elevates symmetrically, uvula midline 11th - shoulder shrug symmetric 12th - tongue protrusion midline    GAIT/STATION:  normal   DIAGNOSTIC DATA (LABS, IMAGING, TESTING) - I reviewed patient records, labs, notes, testing and imaging myself where available.  Lab Results  Component Value Date   WBC 13.2 (H) 05/28/2021   HGB 12.9 (L) 05/28/2021   HCT 37.7 (L) 05/28/2021   MCV 89.8 05/28/2021   PLT 258 05/28/2021      Component Value Date/Time   NA 137 08/17/2021 1115   K 4.1 08/17/2021 1115   CL 100 08/17/2021 1115   CO2 22 08/17/2021 1115   GLUCOSE 84 08/17/2021 1115  GLUCOSE 111 (H) 05/28/2021 0325   BUN 15 08/17/2021 1115   CREATININE 1.07 08/17/2021 1115   CALCIUM 9.6 08/17/2021 1115   PROT 7.3 05/27/2021 1041   ALBUMIN 3.8 05/27/2021 1041   AST 38 05/27/2021 1041   ALT 52 (H) 05/27/2021 1041   ALKPHOS 81 05/27/2021 1041   BILITOT 0.7 05/27/2021 1041   GFRNONAA >60 05/28/2021 0325   GFRAA >60 11/21/2017 2155   No results found for: CHOL, HDL, LDLCALC, LDLDIRECT, TRIG, CHOLHDL No results found for: [id number] Lab Results  Component Value Date   VITAMINB12 318 05/27/2021   Lab Results  Component Value Date   TSH 0.368 05/27/2021    MRI Brain: Reviewed, within normal limits   Routine EEG: Normal routine EEG    ASSESSMENT AND PLAN  26 y.o. year old male here for follow-up for his seizures.  He reports compliance with his medication, currently on Lamictal 100 mg twice daily, denies any side effect from the medication and denies any seizures.  He is doing well.  I will continue him on Lamictal 100 mg twice daily, will check a level today since he reached maintenance dose and I will see him in 1 year for follow-up.  Advised patient to call me if he has a seizure and also return sooner if worse    1. Seizure disorder [facility](HCC)     Patient Instructions  Continue with Lamotrigine 100 mg BID  We will check a  Lamotrigine level today  Follow up in 1 year  --------------------------------------------   Per [facility] [place] statutes, patients with seizures are not allowed to drive until they have been seizure-free for six months.  Other recommendations include using caution when using heavy equipment or power tools. Avoid working on ladders or at heights. Take showers instead of baths.  Do not swim alone.  Ensure the water temperature is not too high on the home water heater. Do not go swimming alone. Do not lock yourself in a room alone (i.e. bathroom). When caring for infants or small children, sit down when holding, feeding, or changing them to minimize risk of injury to the child in the event you have a seizure. Maintain good sleep hygiene. Avoid alcohol.  Also recommend adequate sleep, hydration, good diet and minimize stress.   During the Seizure  - First, ensure adequate ventilation and place patients on the floor on their left side  Loosen clothing around the neck and ensure the airway is patent. If the patient is clenching the teeth, do not force the mouth open with any object as this can cause severe damage - Remove all items from the surrounding that can be hazardous. The patient may be oblivious to what's happening and may not even know what he or she is doing. If the patient is confused and wandering, either gently guide him/her away and block access to outside areas - Reassure the individual and be comforting - Call 911. In most cases, the seizure ends before EMS arrives. However, there are cases when seizures may last over 3 to 5 minutes. Or the individual may have developed breathing difficulties or severe injuries. If a pregnant patient or a person with diabetes develops a seizure, it is prudent to call an ambulance. - Finally, if the patient does not regain full consciousness, then call EMS. Most patients will remain confused for about 45 to 90 minutes after a seizure, so you must use  judgment in calling for help. - Avoid restraints but make sure the  patient is in a bed with padded side rails - Place the individual in a lateral position with the neck slightly flexed; this will help the saliva drain from the mouth and prevent the tongue from falling backward - Remove all nearby furniture and other hazards from the area - Provide verbal assurance as the individual is regaining consciousness - Provide the patient with privacy if possible - Call for help and start treatment as ordered by the caregiver   After the Seizure (Postictal Stage)  After a seizure, most patients experience confusion, fatigue, muscle pain and/or a headache. Thus, one should permit the individual to sleep. For the next few days, reassurance is essential. Being calm and helping reorient the person is also of importance.  Most seizures are painless and end spontaneously. Seizures are not harmful to others but can lead to complications such as stress on the lungs, brain and the heart. Individuals with prior lung problems may develop labored breathing and respiratory distress.     Orders Placed This Encounter  Procedures   Lamotrigine level     No orders of the defined types were placed in this encounter.    Return in about 1 year (around 11/19/2022).    Windell Norfolk, MD 11/19/2021, 11:41 AM  [facility] [address] [place], [place] [address] [phone]

## 2021-11-19 NOTE — Patient Instructions (Signed)
Continue with Lamotrigine 100 mg BID  We will check a Lamotrigine level today  Follow up in 1 year

## 2021-11-21 LAB — LAMOTRIGINE LEVEL: Lamotrigine Lvl: 3.7 ug/mL (ref 2.0–20.0)

## 2022-02-27 ENCOUNTER — Emergency Department (HOSPITAL_COMMUNITY): Payer: Medicaid Other

## 2022-02-27 ENCOUNTER — Emergency Department (HOSPITAL_COMMUNITY)
Admission: EM | Admit: 2022-02-27 | Discharge: 2022-02-27 | Disposition: A | Discharge status: Home / Self Care | Payer: Medicaid Other | Attending: Emergency Medicine | Admitting: Emergency Medicine

## 2022-02-27 ENCOUNTER — Encounter (HOSPITAL_COMMUNITY): Payer: Self-pay | Admitting: *Deleted

## 2022-02-27 ENCOUNTER — Other Ambulatory Visit: Payer: Self-pay

## 2022-02-27 DIAGNOSIS — R410 Disorientation, unspecified: Secondary | ICD-10-CM | POA: Insufficient documentation

## 2022-02-27 DIAGNOSIS — R400 Somnolence: Secondary | ICD-10-CM | POA: Diagnosis not present

## 2022-02-27 DIAGNOSIS — R569 Unspecified convulsions: Secondary | ICD-10-CM | POA: Insufficient documentation

## 2022-02-27 LAB — CBC WITH DIFFERENTIAL/PLATELET
Abs Immature Granulocytes: 0.1 10*3/uL — ABNORMAL HIGH (ref 0.00–0.07)
Basophils Absolute: 0 10*3/uL (ref 0.0–0.1)
Basophils Relative: 0 %
Eosinophils Absolute: 0 10*3/uL (ref 0.0–0.5)
Eosinophils Relative: 0 %
HCT: 43.9 % (ref 39.0–52.0)
Hemoglobin: 15.4 g/dL (ref 13.0–17.0)
Immature Granulocytes: 1 %
Lymphocytes Relative: 5 %
Lymphs Abs: 0.8 10*3/uL (ref 0.7–4.0)
MCH: 31 pg (ref 26.0–34.0)
MCHC: 35.1 g/dL (ref 30.0–36.0)
MCV: 88.5 fL (ref 80.0–100.0)
Monocytes Absolute: 1 10*3/uL (ref 0.1–1.0)
Monocytes Relative: 7 %
Neutro Abs: 13.8 10*3/uL — ABNORMAL HIGH (ref 1.7–7.7)
Neutrophils Relative %: 87 %
Platelets: 277 10*3/uL (ref 150–400)
RBC: 4.96 MIL/uL (ref 4.22–5.81)
RDW: 12.9 % (ref 11.5–15.5)
WBC: 15.8 10*3/uL — ABNORMAL HIGH (ref 4.0–10.5)
nRBC: 0 % (ref 0.0–0.2)

## 2022-02-27 LAB — COMPREHENSIVE METABOLIC PANEL
ALT: 81 U/L — ABNORMAL HIGH (ref 0–44)
AST: 150 U/L — ABNORMAL HIGH (ref 15–41)
Albumin: 4.1 g/dL (ref 3.5–5.0)
Alkaline Phosphatase: 90 U/L (ref 38–126)
Anion gap: 12 (ref 5–15)
BUN: 13 mg/dL (ref 6–20)
CO2: 21 mmol/L — ABNORMAL LOW (ref 22–32)
Calcium: 9.4 mg/dL (ref 8.9–10.3)
Chloride: 102 mmol/L (ref 98–111)
Creatinine, Ser: 1.29 mg/dL — ABNORMAL HIGH (ref 0.61–1.24)
GFR, Estimated: >60 mL/min (ref >60)
Glucose, Bld: 95 mg/dL (ref 70–99)
Potassium: 3.9 mmol/L (ref 3.5–5.1)
Sodium: 135 mmol/L (ref 135–145)
Total Bilirubin: 0.8 mg/dL (ref 0.3–1.2)
Total Protein: 7.6 g/dL (ref 6.5–8.1)

## 2022-02-27 LAB — URINALYSIS, ROUTINE W REFLEX MICROSCOPIC
Bacteria, UA: NONE SEEN
Bilirubin Urine: NEGATIVE
Glucose, UA: NEGATIVE mg/dL
Ketones, ur: NEGATIVE mg/dL
Leukocytes,Ua: NEGATIVE
Nitrite: NEGATIVE
Protein, ur: NEGATIVE mg/dL
Specific Gravity, Urine: 1.015 (ref 1.005–1.030)
pH: 5 (ref 5.0–8.0)

## 2022-02-27 MED ORDER — SODIUM CHLORIDE 0.9 % IV BOLUS
1000.0000 mL | Freq: Once | INTRAVENOUS | Status: AC
  Administered 2022-02-27: 1000 mL via INTRAVENOUS

## 2022-02-27 NOTE — ED Provider Notes (Signed)
Signout note ? ?26 year old presented to ER after he had a seizure.  Has known history of epilepsy.  Endorses compliance with medications.  Followed by outpatient neurology.  Last seizure was in July 2022.  At time of signout, plan to reassess patient, if mental status stable, remains well-appearing then discharged home. ? ?I reassessed patient.  He has no ongoing medical complaints.  He has not had any recurrent seizure activity.  Lab work reviewed, leukocytosis.  Patient has no fever and no infectious symptoms at this time.  Slight elevation in AST and ALT.  Patient does endorse occasional heavy drinking.  Counseled on alcohol cessation.  No history of withdrawal.  Denies alcohol abuse. ? ?Patient is tolerating p.o. without any difficulty.  He is at his baseline mental status.  Feel he is stable for discharge at this time.  Advise close follow-up with his outpatient neurologist to discuss his ongoing seizure regimen.  Discussed seizure precautions.  Reviewed return precautions. ?  ?Milagros Loll, MD ?02/27/22 1644 ? ?

## 2022-02-27 NOTE — ED Provider Notes (Signed)
?[facility] EMERGENCY DEPARTMENT ?Provider Note ? ? ?CSN: [id number] ?Arrival date & time: 02/27/22  1303 ? ?  ? ?History ? ?Chief Complaint  ?Patient presents with  ? Seizures  ? ? ?Mario Davis is a 26 y.o. male. ? ?The history is provided by the patient, medical records and a relative. No language interpreter was used.  ?Seizures ?Seizure activity on arrival: no   ?Seizure type:  Grand mal ?Initial focality:  None ?Postictal symptoms: confusion and somnolence   ?Return to baseline: no   ?Severity:  Moderate ?Duration:  2 minutes ?Timing:  Once ?Progression:  Unchanged ?Recent head injury:  No recent head injuries ?PTA treatment:  Midazolam ?History of seizures: yes   ? ?  ? ?Home Medications ?Prior to Admission medications   ?Medication Sig Start Date End Date Taking? Authorizing Provider  ?lamoTRIgine (LAMICTAL) 100 MG tablet Take 1 tablet (100 mg total) by mouth 2 (two) times daily. 08/17/21 11/19/21  Windell Norfolk, MD  ?   ? ?Allergies    ?Patient has no known allergies.   ? ?Review of Systems   ?Review of Systems  ?Constitutional:  Negative for chills, fatigue and fever.  ?HENT:  Negative for congestion.   ?Eyes:  Negative for visual disturbance.  ?Respiratory:  Negative for cough, chest tightness, shortness of breath and wheezing.   ?Cardiovascular:  Negative for chest pain.  ?Gastrointestinal:  Negative for abdominal pain, constipation, diarrhea, nausea and vomiting.  ?Genitourinary:  Negative for dysuria, flank pain and frequency.  ?Musculoskeletal:  Negative for back pain, neck pain and neck stiffness.  ?Skin:  Negative for rash and wound.  ?Neurological:  Positive for seizures and headaches. Negative for dizziness, weakness and light-headedness.  ?Psychiatric/Behavioral:  Positive for agitation (per EMS). Negative for confusion.   ?All other systems reviewed and are negative. ? ?Physical Exam ?Updated Vital Signs ?BP (!) 96/39 (BP Location: Right Arm)   Pulse (!) 125   Temp  98.2 ?F (36.8 ?C) (Oral)   Resp 12   Ht 6' (1.829 m)   Wt 84.8 kg   SpO2 96%   BMI 25.35 kg/m?  ?Physical Exam ?Vitals and nursing note reviewed.  ?Constitutional:   ?   General: He is not in acute distress. ?   Appearance: He is well-developed. He is not ill-appearing, toxic-appearing or diaphoretic.  ?HENT:  ?   Head: Normocephalic and atraumatic.  ?   Nose: Nose normal.  ?   Mouth/Throat:  ?   Mouth: Mucous membranes are moist.  ?Eyes:  ?   Extraocular Movements: Extraocular movements intact.  ?   Conjunctiva/sclera: Conjunctivae normal.  ?   Pupils: Pupils are equal, round, and reactive to light.  ?Cardiovascular:  ?   Rate and Rhythm: Normal rate and regular rhythm.  ?   Heart sounds: No murmur heard. ?Pulmonary:  ?   Effort: Pulmonary effort is normal. No respiratory distress.  ?   Breath sounds: Normal breath sounds. No wheezing, rhonchi or rales.  ?Chest:  ?   Chest wall: No tenderness.  ?Abdominal:  ?   General: Abdomen is flat.  ?   Palpations: Abdomen is soft.  ?   Tenderness: There is no abdominal tenderness. There is no right CVA tenderness, left CVA tenderness, guarding or rebound.  ?Musculoskeletal:     ?   General: No swelling.  ?   Cervical back: Neck supple. No tenderness.  ?Skin: ?   General: Skin is warm and dry.  ?  Capillary Refill: Capillary refill takes less than 2 seconds.  ?   Findings: No erythema or rash.  ?Neurological:  ?   General: No focal deficit present.  ?   Mental Status: He is alert.  ?   Sensory: No sensory deficit.  ?   Motor: No weakness.  ?Psychiatric:     ?   Mood and Affect: Mood normal.  ? ? ?ED Results / Procedures / Treatments   ?Labs ?(all labs ordered are listed, but only abnormal results are displayed) ?Labs Reviewed  ?CBC WITH DIFFERENTIAL/PLATELET - Abnormal; Notable for the following components:  ?    Result Value  ? WBC 15.8 (*)   ? Neutro Abs 13.8 (*)   ? Abs Immature Granulocytes 0.10 (*)   ? All other components within normal limits  ?COMPREHENSIVE  METABOLIC PANEL - Abnormal; Notable for the following components:  ? CO2 21 (*)   ? Creatinine, Ser 1.29 (*)   ? AST 150 (*)   ? ALT 81 (*)   ? All other components within normal limits  ?URINALYSIS, ROUTINE W REFLEX MICROSCOPIC - Abnormal; Notable for the following components:  ? Hgb urine dipstick SMALL (*)   ? All other components within normal limits  ?LAMOTRIGINE LEVEL  ? ? ?EKG ?EKG Interpretation ? ?Date/Time:  Saturday February 27 2022 13:47:29 EDT ?Ventricular Rate:  118 ?PR Interval:  169 ?QRS Duration: 84 ?QT Interval:  303 ?QTC Calculation: 425 ?R Axis:   69 ?Text Interpretation: Sinus tachycardia Probable left atrial enlargement when compared to prior, faster rate. No STEMI Confirmed by Theda Belfast ([id number]) on 02/27/2022 1:57:10 PM ? ?Radiology ?CT HEAD WO CONTRAST ( ) ? ?Result Date: 02/27/2022 ?CLINICAL DATA:  Headache. EXAM: CT HEAD WITHOUT CONTRAST TECHNIQUE: Contiguous axial images were obtained from the base of the skull through the vertex without intravenous contrast. RADIATION DOSE REDUCTION: This exam was performed according to the departmental dose-optimization program which includes automated exposure control, adjustment of the mA and/or kV according to patient size and/or use of iterative reconstruction technique. COMPARISON:  MR 05/26/2021 FINDINGS: Brain: No evidence of acute infarction, hemorrhage, hydrocephalus, extra-axial collection or mass lesion/mass effect. Vascular: No hyperdense vessel or unexpected calcification. Skull: Normal. Negative for fracture or focal lesion. Sinuses/Orbits: Mild mucosal thickening is noted involving the left maxillary sinus. The remaining paranasal sinuses are clear. Other: None. IMPRESSION: 1. No acute intracranial abnormalities. 2. Mild left maxillary sinus inflammation. Electronically Signed   By: Signa Kell M.D.   On: 02/27/2022 14:56   ? ?Procedures ?Procedures  ? ? ?Medications Ordered in ED ?Medications  ?sodium chloride 0.9 % bolus 1,000 mL  (1,000 mLs Intravenous New Bag/Given 02/27/22 1403)  ? ? ?ED Course/ Medical Decision Making/ A&P ?  ?                        ?Medical Decision Making ?Amount and/or Complexity of Data Reviewed ?Labs: ordered. ?Radiology: ordered. ? ? ? ?TEL HEVIA Davis is a 26 y.o. male with a past medical history significant for seizures who presents for seizure.  According to EMS, as you have seizures and then had a seizure lasting approximate 2 minutes today.  According to family, patient did hit his head while in the postictal state.  EMS reports that patient was getting violent and agitated during this postictal period and was given 5 of Versed causing him to become sleepy and somnolent.  This is reportedly typical in his postictal state.  Per EMS, patient has had some titration of his seizure medications recently but otherwise there were no complaints. ? ?Patient is answering questions but is still drowsy.  He reports no recent fevers, chills, ingestion, cough, nausea, vomiting, constipation, diarrhea, or urinary changes.  Chart review shows that he has been off of Keppra for several months but has been up treating his Lamictal with his neurology team.  He denies any drug use, alcohol use, or any other medication use.  He denies any recent trauma.  He initially was saying no headache however after family arrived patient was complaining of some headache and family said that he did hit his head during the episode.  Patient has not any neck pain, chest pain, or abdominal pain.  Denying other complaints. ? ?On my initial exam, no focal neurologic deficit seen.  Intact sensation, strength, and pulses in extremities.  Pupils are symmetric and reactive normal extraocular movements.  Symmetric smile.  Speech was clear but he was drowsy.  No evidence of acute trauma initially.  No murmur.  Abdomen nontender.  Lungs clear.  Chest nontender.  Moving all extremities appropriately. ? ?Initially, plan was to get some labs  including a Lamictal level as this may help with neurology as an outpatient.  I suspect patient had a breakthrough seizure of some kind.  His blood pressures were soft during transport so we will give some fluids.  Unclear

## 2022-02-27 NOTE — ED Triage Notes (Signed)
Pt here via [facility] from home for seizure lasting 2 minutes.  Hx of seizures with recent change of medications to lamotrigine.  Given 5 of versed en-route for violent behavior, which is typical of his post-ictal state. ?

## 2022-02-27 NOTE — ED Notes (Signed)
Pt ao x 4.  Ambulated to bathroom without difficulty. ?

## 2022-02-27 NOTE — Discharge Instructions (Addendum)
Please follow-up with your neurologist.  Contact their office on Monday to discuss your breakthrough seizure and discuss ongoing medication management.  You should not drive.  You should not operate heavy machinery.  You should stay away from any activity that could be dangerous if you subsequently have a seizure. ? ?Come back to ER if you develop any recurrent seizure activity. ?

## 2022-02-27 NOTE — ED Notes (Signed)
Pt to CT

## 2022-03-01 LAB — LAMOTRIGINE LEVEL: Lamotrigine Lvl: 1.7 ug/mL — ABNORMAL LOW (ref 2.0–20.0)

## 2022-03-02 ENCOUNTER — Other Ambulatory Visit: Payer: Self-pay | Admitting: Neurology

## 2022-03-02 ENCOUNTER — Telehealth: Payer: Self-pay | Admitting: *Deleted

## 2022-03-02 MED ORDER — LAMOTRIGINE 150 MG PO TABS: 150.0000 mg | ORAL_TABLET | Freq: Two times a day (BID) | ORAL | 12 refills | Status: DC

## 2022-03-02 NOTE — Telephone Encounter (Signed)
I spoke to the patient. He verbalized understanding of the importance of taking his medication on time, routinely. Agreeable to pick up the increased dose of lamotrigine. He will contact our office for any further seizure activity. We reviewed [place] law again (no driving until seizure free for six months). ?

## 2022-03-02 NOTE — Telephone Encounter (Signed)
The patient sent our office a message letting us know he experienced a breakthrough seizure on 02/27/22 (account of event in ED note). He was transported the hospital by EMS. Denies any missed doses of lamotrigine (takes 100mg  BID). No medication changes made upon discharge. Directed to call our office for further instruction. He verbalized understanding of [place] law of no driving until six months seizure-free.  ?

## 2022-03-02 NOTE — Telephone Encounter (Signed)
Please call and inform the patient that his lamictal level was low 1.7. Inquired about medication compliance and advise him to increase the medication to 150 mg twice daily. New prescription sent to the pharmacy.  ? ?Dr. April Manson

## 2022-05-31 ENCOUNTER — Telehealth: Payer: Self-pay | Admitting: Neurology

## 2022-05-31 NOTE — Telephone Encounter (Signed)
Pt called wanting to speak to an RN regarding the really bad headaches he has been getting and how they are causing his hands to get numb. Please advise.

## 2022-06-01 NOTE — Telephone Encounter (Signed)
Thanks

## 2022-06-01 NOTE — Telephone Encounter (Signed)
I spoke to the patient. Reports new onset of frequent headaches. Started two weeks ago, about three times per week. No relief with ibuprofen. Denies any injury. Also, he has concerns about intermittent numbness in his bilateral arm/hands. Occurring every other day for the last six weeks. Per vo by Dr. Teresa Coombs, okay to schedule for these new issues. Placed on schedule 06/02/22.

## 2022-06-02 ENCOUNTER — Ambulatory Visit: Payer: Medicaid Other | Admitting: Neurology

## 2022-06-02 ENCOUNTER — Encounter: Payer: Self-pay | Admitting: Neurology

## 2022-06-02 VITALS — BP 100/68 | HR 74 | Ht 72.0 in | Wt 184.5 lb

## 2022-06-02 DIAGNOSIS — G40909 Epilepsy, unspecified, not intractable, without status epilepticus: Secondary | ICD-10-CM | POA: Diagnosis not present

## 2022-06-02 DIAGNOSIS — G44209 Tension-type headache, unspecified, not intractable: Secondary | ICD-10-CM

## 2022-06-02 MED ORDER — GABAPENTIN 100 MG PO CAPS: 100.0000 mg | ORAL_CAPSULE | Freq: Every day | ORAL | 0 refills | Status: DC

## 2022-06-02 MED ORDER — CYCLOBENZAPRINE HCL 10 MG PO TABS: 10.0000 mg | ORAL_TABLET | ORAL | 0 refills | Status: DC | PRN

## 2022-06-02 MED ORDER — LACOSAMIDE 150 MG PO TABS: 150.0000 mg | ORAL_TABLET | Freq: Two times a day (BID) | ORAL | 6 refills | Status: DC

## 2022-06-02 NOTE — Patient Instructions (Addendum)
Start with Gabapentin 100 mg nightly  Start with Vimpat 150 mg twice daily  Decrease Lamictal to 150 mg daily for one week then discontinue it  Use Flexeril as needed for the neck spasms  Contact me in one month if symptoms not improved otherwise follow up as scheduled in January

## 2022-06-02 NOTE — Progress Notes (Signed)
[facility]  PATIENT: Mario Davis DOB: [birth year]  REFERRING CLINICIAN: Janeece Agee, NP HISTORY FROM: Patient and [name]  REASON FOR VISIT: New onset seizure   HISTORICAL  CHIEF COMPLAINT:  Chief Complaint  Patient presents with   Follow-up    Rm 12. Alone. Headaches, numbness/tingling.   INTERVAL HISTORY 06/02/2022 Patient presents today for follow-up, last visit was in January.  At that time he was doing well on Lamotrigine 100 mg BID.  Unfortunately in April, he did have a breakthrough seizure.  At that time his lamotrigine level was 1.7.  Patient reports compliance with the medication.  I increase the lamotrigine to 150 mg twice daily.  Since then he has been having worsening headache, complaint of numbness and tingling in the mainly right arm and right leg but no seizures.   INTERVAL HISTORY 11/19/2021:  Patient presents today for follow-up, last visit was on October 3.  At that time plan was to increase Lamictal 200 mg twice daily.  He reported compliance with medication, denies any seizures since last visit, denies any side effect from the medication.  No other concerns or complaints.  Interval History 08/17/2021:  Patient presented for follow-up, last visit was July 28, at that time he was started on lamotrigine 25 mg with plan to uptitrate until 100 mg twice a day.  For some reason, patient is currently taking 100 mg in the morning and 25 mg at night.  He said that he was not aware that his evening dose was supposed to be 100 mg.  He denies any seizures since last visit, denies any new rash but states that lamotrigine is keeping him up at night.  He takes his evening dose around 8 PM.  No other complaints.  He is currently not working.   HISTORY OF PRESENT ILLNESS:  This is a 26 year old man with no past medical history who is presenting with new onset seizure.  Patient reported 2 weeks ago he had his first lifetime seizure.  Per  girlfriend who accompanied patient today she reported patient was asleep and she noted that he was staring at the ceiling, then followed by left face twitching followed by generalized body convulsions.  The entire episode lasted about 2 to 3-minute, after which he was very confused and combative.  He did not remember where he was, he wanted to leave the house and it took him about 30 minutes to regain consciousness.  He was taken to a local ED and admitted for additional work-up.  Initial lab work-up was unrevealing.  He did have a normal MRI of the brain.  He also have a routine EEG which was normal.  He was started on Keppra and discharged home.  Patient stated since starting the Keppra, he has been having headaches and pain behind eyes and he noted also that blood in the stool.  He also reported he was having some mood changes, he became angry for no reason.  He has discontinued the Keppra 4 days ago. He has a family history of seizures mother and uncle had seizures.  He denies any previous history of head trauma no other seizure risk factors noted. Since being discharged only ED he has not had any additional seizure.  He is currently not driving.  She understand that he is not supposed to drive for at least 6 months.  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  He continues to play but has not had any additional incident  Currently he denies any headaches no change in vision no abdominal pain no other complaint.   One additional issue that he brought up is chest pain 4 months ago, described as could not breathe, happened every other night, seen his PMD, had a EKG and told that everything is fine.   REVIEW OF SYSTEMS: Full 14 system review of systems performed and negative with exception of:  except as noted in the HPI  ALLERGIES: No Known Allergies  HOME MEDICATIONS: Outpatient Medications Prior to Visit  Medication Sig Dispense Refill   lamoTRIgine (LAMICTAL) 150 MG tablet Take 1 tablet (150 mg total) by mouth 2 (two) times daily. 60 tablet 12   No facility-administered medications prior to visit.    PAST MEDICAL HISTORY: Past Medical History:  Diagnosis Date   Seizures (HCC)     PAST SURGICAL HISTORY: History reviewed. No pertinent surgical history.  FAMILY HISTORY: Family History  Problem Relation Age of Onset   Seizures Mother    Hypertension Father     SOCIAL HISTORY: Social History   Socioeconomic History   Marital status: Significant Other    Spouse name: Not on file   Number of children: 0   Years of education: Not on file   Highest education level: Not on file  Occupational History   Not on file  Tobacco Use   Smoking status: Never   Smokeless tobacco: Never  Vaping Use   Vaping Use: Never used  Substance and Sexual Activity   Alcohol use: Yes   Drug use: No   Sexual activity: Not on file  Other Topics Concern   Not on file  Social History Narrative   Lives with girlfriend [name]   Right Handed   Drinks 6-8 cups caffeine daily   Social Determinants of Health   Financial Resource Strain: Not on file  Food Insecurity: Not on file  Transportation Needs: Not on file  Physical Activity: Not on file  Stress: Not on file  Social Connections: Not on file  Intimate Partner Violence: Not on file     PHYSICAL EXAM  GENERAL EXAM/CONSTITUTIONAL: Vitals:  Vitals:   06/02/22 1004  BP: 100/68  Pulse: 74  Weight: 184 lb 8 oz (83.7 kg)  Height: 6' (1.829 m)   Body mass index is 25.02 kg/m. Wt Readings from Last 3 Encounters:  06/02/22 184 lb 8 oz (83.7 kg)  02/27/22 186 lb 15.2 oz (84.8 kg)  11/19/21 187 lb (84.8 kg)   Patient is in no distress; well developed, nourished and groomed; neck is supple   NEUROLOGIC: MENTAL  STATUS:      No data to display         awake, alert, oriented to person, place and time recent and remote memory intact normal attention and concentration language fluent, comprehension intact, naming intact fund of knowledge appropriate  CRANIAL NERVE:  2nd, 3rd, 4th, 6th - pupils equal and reactive to light, visual fields full to confrontation, extraocular muscles intact, no nystagmus 5th - facial sensation symmetric 7th - facial strength symmetric 8th - hearing intact 9th - palate elevates symmetrically, uvula midline 11th - shoulder shrug symmetric 12th - tongue protrusion midline   GAIT/STATION:  normal   DIAGNOSTIC DATA (LABS, IMAGING, TESTING) - I  reviewed patient records, labs, notes, testing and imaging myself where available.  Lab Results  Component Value Date   WBC 15.8 (H) 02/27/2022   HGB 15.4 02/27/2022   HCT 43.9 02/27/2022   MCV 88.5 02/27/2022   PLT 277 02/27/2022      Component Value Date/Time   NA 135 02/27/2022 1354   NA 137 08/17/2021 1115   K 3.9 02/27/2022 1354   CL 102 02/27/2022 1354   CO2 21 (L) 02/27/2022 1354   GLUCOSE 95 02/27/2022 1354   BUN 13 02/27/2022 1354   BUN 15 08/17/2021 1115   CREATININE 1.29 (H) 02/27/2022 1354   CALCIUM 9.4 02/27/2022 1354   PROT 7.6 02/27/2022 1354   ALBUMIN 4.1 02/27/2022 1354   AST 150 (H) 02/27/2022 1354   ALT 81 (H) 02/27/2022 1354   ALKPHOS 90 02/27/2022 1354   BILITOT 0.8 02/27/2022 1354   GFRNONAA >60 02/27/2022 1354   GFRAA >60 11/21/2017 2155   No results found for: "CHOL", "HDL", "LDLCALC", "LDLDIRECT", "TRIG", "CHOLHDL" No results found for: "HGBA1C" Lab Results  Component Value Date   VITAMINB12 318 05/27/2021   Lab Results  Component Value Date   TSH 0.368 05/27/2021    MRI Brain: Reviewed, within normal limits   Routine EEG: Normal routine EEG    ASSESSMENT AND PLAN  26 y.o. year old male here for follow-up for his seizures and headaches.  He did have a breakthrough  seizure while on Lamictal 100 mg twice daily, at that time his level was 1.7.  I have increased to 150 mg twice daily but since then he has been having worsening headache, numbness and tingling in the arms mainly the right side.  I have explained to the patient that the headaches are most likely side effect from Lamictal.  I will plan to switch him from Lamictal to lacosamide 150 mg twice daily.  I have advised him to decrease the Lamictal to 150 mg daily for 1 week then discontinue. He is comfortable with plan.   I also gave him gabapentin 100 mg for 30 days for headache prevention and Flexeril to use as needed for his neck spasm.  Patient understands to contact me in 1 month if his symptoms are not getting better, if they are better I will see him in January as scheduled.    1. Seizure disorder ([place])   2. Tension headache     Patient Instructions  Start with Gabapentin 100 mg nightly  Start with Vimpat 150 mg twice daily  Decrease Lamictal to 150 mg daily for one week then discontinue it  Use Flexeril as needed for the neck spasms  Contact me in one month if symptoms not improved otherwise follow up as scheduled in January    --------------------------------------------   Per [facility] statutes, patients with seizures are not allowed to drive until they have been seizure-free for six months.  Other recommendations include using caution when using heavy equipment or power tools. Avoid working on ladders or at heights. Take showers instead of baths.  Do not swim alone.  Ensure the water temperature is not too high on the home water heater. Do not go swimming alone. Do not lock yourself in a room alone (i.e. bathroom). When caring for infants or small children, sit down when holding, feeding, or changing them to minimize risk of injury to the child in the event you have a seizure. Maintain good sleep hygiene. Avoid alcohol.  Also recommend adequate sleep, hydration, good  diet and minimize  stress.   During the Seizure  - First, ensure adequate ventilation and place patients on the floor on their left side  Loosen clothing around the neck and ensure the airway is patent. If the patient is clenching the teeth, do not force the mouth open with any object as this can cause severe damage - Remove all items from the surrounding that can be hazardous. The patient may be oblivious to what's happening and may not even know what he or she is doing. If the patient is confused and wandering, either gently guide him/her away and block access to outside areas - Reassure the individual and be comforting - Call 911. In most cases, the seizure ends before EMS arrives. However, there are cases when seizures may last over 3 to 5 minutes. Or the individual may have developed breathing difficulties or severe injuries. If a pregnant patient or a person with diabetes develops a seizure, it is prudent to call an ambulance. - Finally, if the patient does not regain full consciousness, then call EMS. Most patients will remain confused for about 45 to 90 minutes after a seizure, so you must use judgment in calling for help. - Avoid restraints but make sure the patient is in a bed with padded side rails - Place the individual in a lateral position with the neck slightly flexed; this will help the saliva drain from the mouth and prevent the tongue from falling backward - Remove all nearby furniture and other hazards from the area - Provide verbal assurance as the individual is regaining consciousness - Provide the patient with privacy if possible - Call for help and start treatment as ordered by the caregiver   After the Seizure (Postictal Stage)  After a seizure, most patients experience confusion, fatigue, muscle pain and/or a headache. Thus, one should permit the individual to sleep. For the next few days, reassurance is essential. Being calm and helping reorient the person is also of importance.  Most  seizures are painless and end spontaneously. Seizures are not harmful to others but can lead to complications such as stress on the lungs, brain and the heart. Individuals with prior lung problems may develop labored breathing and respiratory distress.     No orders of the defined types were placed in this encounter.    Meds ordered this encounter  Medications   gabapentin (NEURONTIN) 100 MG capsule    Sig: Take 1 capsule (100 mg total) by mouth at bedtime.    Dispense:  30 capsule    Refill:  0   Lacosamide 150 MG TABS    Sig: Take 1 tablet (150 mg total) by mouth in the morning and at bedtime.    Dispense:  60 tablet    Refill:  6   cyclobenzaprine (FLEXERIL) 10 MG tablet    Sig: Take 1 tablet (10 mg total) by mouth as needed for up to 10 doses for muscle spasms.    Dispense:  10 tablet    Refill:  0     Return in about 6 months (around 11/25/2022).  I have spent a total of 30 minutes dedicated to this patient today, preparing to see patient, performing a medically appropriate examination and evaluation, ordering tests and/or medications and procedures, and counseling and educating the patient/family/caregiver; independently interpreting result and communicating results to the family/patient/caregiver; and documenting clinical information in the electronic medical record.   Windell Norfolk, MD 06/02/2022, 10:40 AM  [facility] [address]  [place], [place] [address] [phone]

## 2022-06-22 ENCOUNTER — Telehealth: Payer: Self-pay | Admitting: Neurology

## 2022-06-22 ENCOUNTER — Other Ambulatory Visit: Payer: Self-pay | Admitting: Neurology

## 2022-06-22 MED ORDER — LACOSAMIDE 200 MG PO TABS: 200.0000 mg | ORAL_TABLET | Freq: Two times a day (BID) | ORAL | 6 refills | Status: DC

## 2022-06-22 NOTE — Telephone Encounter (Signed)
I agree, he will need intermittent FMLA

## 2022-06-22 NOTE — Telephone Encounter (Signed)
Pt called wanting to speak to the Provider or RN to discuss the numbness that has returned in his hands and feet since last week. Please advise.

## 2022-06-22 NOTE — Telephone Encounter (Signed)
I called the pt. He reports numbness/pain localized to the right side continues to be an issue for him. Reports sx are most severe upon waking up and he is unable to move for 2-3 hours.   He has been late to work on several occasions due of these symptoms.  He also reports he had a seizure between 1 and 3 am on 06/19/2022. He reports wife and friend were present at the time but did not have any specific details of the event.   He confirmed he has been taking his medications as prescribed( see med list for reference) and has not missed any dosages.   Pt has two questions: Should his medications be adjusted to help with localized right pain/numbness? Can a letter be provided to cover the pt due to his absences?  Will fwd to MD for review.

## 2022-06-22 NOTE — Telephone Encounter (Signed)
I will increase his Vimpat to 200 mg twice daily and clarify which day(s) he needs  off. Thanks

## 2022-07-05 ENCOUNTER — Other Ambulatory Visit: Payer: Self-pay | Admitting: Neurology

## 2022-07-05 DIAGNOSIS — M5412 Radiculopathy, cervical region: Secondary | ICD-10-CM

## 2022-07-05 DIAGNOSIS — R200 Anesthesia of skin: Secondary | ICD-10-CM

## 2022-07-05 NOTE — Telephone Encounter (Signed)
Please inform patient that I will order a MRI Cervical spine for further study about his numbness.

## 2022-07-13 ENCOUNTER — Encounter (HOSPITAL_COMMUNITY): Payer: Self-pay

## 2022-07-13 ENCOUNTER — Other Ambulatory Visit: Payer: Self-pay

## 2022-07-13 ENCOUNTER — Emergency Department (HOSPITAL_COMMUNITY)
Admission: EM | Admit: 2022-07-13 | Discharge: 2022-07-13 | Disposition: A | Discharge status: Home / Self Care | Payer: Medicaid Other | Attending: Emergency Medicine | Admitting: Emergency Medicine

## 2022-07-13 ENCOUNTER — Emergency Department (HOSPITAL_COMMUNITY): Payer: Medicaid Other

## 2022-07-13 DIAGNOSIS — H532 Diplopia: Secondary | ICD-10-CM | POA: Insufficient documentation

## 2022-07-13 DIAGNOSIS — R519 Headache, unspecified: Secondary | ICD-10-CM | POA: Diagnosis present

## 2022-07-13 DIAGNOSIS — H539 Unspecified visual disturbance: Secondary | ICD-10-CM

## 2022-07-13 DIAGNOSIS — R531 Weakness: Secondary | ICD-10-CM | POA: Diagnosis not present

## 2022-07-13 DIAGNOSIS — H538 Other visual disturbances: Secondary | ICD-10-CM | POA: Diagnosis not present

## 2022-07-13 LAB — CBC WITH DIFFERENTIAL/PLATELET
Abs Immature Granulocytes: 0.02 10*3/uL (ref 0.00–0.07)
Basophils Absolute: 0 10*3/uL (ref 0.0–0.1)
Basophils Relative: 1 %
Eosinophils Absolute: 0.1 10*3/uL (ref 0.0–0.5)
Eosinophils Relative: 1 %
HCT: 43.9 % (ref 39.0–52.0)
Hemoglobin: 15.6 g/dL (ref 13.0–17.0)
Immature Granulocytes: 0 %
Lymphocytes Relative: 35 %
Lymphs Abs: 2.8 10*3/uL (ref 0.7–4.0)
MCH: 30.4 pg (ref 26.0–34.0)
MCHC: 35.5 g/dL (ref 30.0–36.0)
MCV: 85.6 fL (ref 80.0–100.0)
Monocytes Absolute: 0.7 10*3/uL (ref 0.1–1.0)
Monocytes Relative: 9 %
Neutro Abs: 4.4 10*3/uL (ref 1.7–7.7)
Neutrophils Relative %: 54 %
Platelets: 302 10*3/uL (ref 150–400)
RBC: 5.13 MIL/uL (ref 4.22–5.81)
RDW: 12.8 % (ref 11.5–15.5)
WBC: 8.1 10*3/uL (ref 4.0–10.5)
nRBC: 0 % (ref 0.0–0.2)

## 2022-07-13 LAB — VITAMIN B12: Vitamin B-12: 338 pg/mL (ref 180–914)

## 2022-07-13 LAB — COMPREHENSIVE METABOLIC PANEL
ALT: 26 U/L (ref 0–44)
AST: 20 U/L (ref 15–41)
Albumin: 4 g/dL (ref 3.5–5.0)
Alkaline Phosphatase: 78 U/L (ref 38–126)
Anion gap: 9 (ref 5–15)
BUN: 14 mg/dL (ref 6–20)
CO2: 24 mmol/L (ref 22–32)
Calcium: 9.5 mg/dL (ref 8.9–10.3)
Chloride: 106 mmol/L (ref 98–111)
Creatinine, Ser: 0.99 mg/dL (ref 0.61–1.24)
GFR, Estimated: >60 mL/min (ref >60)
Glucose, Bld: 106 mg/dL — ABNORMAL HIGH (ref 70–99)
Potassium: 3.7 mmol/L (ref 3.5–5.1)
Sodium: 139 mmol/L (ref 135–145)
Total Bilirubin: 0.6 mg/dL (ref 0.3–1.2)
Total Protein: 7.6 g/dL (ref 6.5–8.1)

## 2022-07-13 LAB — HIV ANTIBODY (ROUTINE TESTING W REFLEX): HIV Screen 4th Generation wRfx: NONREACTIVE

## 2022-07-13 LAB — FOLATE: Folate: 9.5 ng/mL (ref >5.9)

## 2022-07-13 MED ORDER — SODIUM CHLORIDE 0.9 % IV BOLUS
1000.0000 mL | Freq: Once | INTRAVENOUS | Status: AC
  Administered 2022-07-13: 1000 mL via INTRAVENOUS

## 2022-07-13 MED ORDER — GADOBUTROL 1 MMOL/ML IV SOLN
8.0000 mL | Freq: Once | INTRAVENOUS | Status: AC | PRN
  Administered 2022-07-13: 8 mL via INTRAVENOUS

## 2022-07-13 MED ORDER — DIPHENHYDRAMINE HCL 50 MG/ML IJ SOLN
50.0000 mg | Freq: Once | INTRAMUSCULAR | Status: AC
  Administered 2022-07-13: 50 mg via INTRAVENOUS
  Filled 2022-07-13: qty 1

## 2022-07-13 MED ORDER — PROCHLORPERAZINE EDISYLATE 10 MG/2ML IJ SOLN
10.0000 mg | Freq: Once | INTRAMUSCULAR | Status: AC
  Administered 2022-07-13: 10 mg via INTRAVENOUS
  Filled 2022-07-13: qty 2

## 2022-07-13 NOTE — Discharge Instructions (Signed)
Your history, exam, and imaging today did not show any acute evidence of multiple sclerosis or stroke at this time.  We were going to get the MRI of your neck as recommended by the neurology team however due to the contrast delay, it would have to be early in the morning.  You did not want to wait which is reasonable given your improvement in headache after medications.  We also sent off several other labs for neurology to follow-up on so please follow-up with your neurologist and PCP.  Please rest and stay hydrated.  If any symptoms change or worsen acutely, please return to the nearest emergency department.

## 2022-07-13 NOTE — ED Provider Triage Note (Signed)
Emergency Medicine Provider Triage Evaluation Note  Mario Davis , a 26 y.o. male  was evaluated in triage.  Patient has a past medical history of seizures.  He has been having more seizures over the past year.  Follows with neurology.  Was recently changed from lamotrigine to lacosamide and has had multiple times where he spaces out and then cannot remember what happened.  Potentially absence seizure's?  Also sometimes he cannot move his hand or one of his legs in the morning.  Also reporting some visual disturbance in his left eye.  Says is blurry and sometimes it is nearly completely black.  Neurology sent him here for MRI   Review of Systems  Positive: See HPI Negative: See HPI  Physical Exam  BP 117/78 (BP Location: Right Arm)   Pulse 86   Temp 98 F (36.7 C) (Oral)   Resp 16   Ht 6' (1.829 m)   Wt 83.5 kg   SpO2 96%   BMI 24.95 kg/m  Gen:   Awake, no distress   Resp:  Normal effort  MSK:   Moves extremities without difficulty  Other:  EOMs grossly intact.  No problem with finger-nose.  Cranial nerves II through XII grossly intact.  Medical Decision Making  Medically screening exam initiated at 12:17 PM.  Appropriate orders placed.  Johndavid Z O Davis was informed that the remainder of the evaluation will be completed by another provider, this initial triage assessment does not replace that evaluation, and the importance of remaining in the ED until their evaluation is complete.     Saddie Benders, PA-C 07/13/22 1229

## 2022-07-13 NOTE — ED Provider Notes (Incomplete)
[facility] Provider Note   CSN: [id number] Arrival date & time: 07/13/22  1130     History {Add pertinent medical, surgical, social history, OB history to HPI:1} Chief Complaint  Patient presents with   Eye Problem    Mario Davis is a 26 y.o. male.   Eye Problem      Home Medications Prior to Admission medications   Medication Sig Start Date End Date Taking? Authorizing Provider  cyclobenzaprine (FLEXERIL) 10 MG tablet Take 1 tablet (10 mg total) by mouth as needed for up to 10 doses for muscle spasms. 06/02/22   Windell Norfolk, MD  gabapentin (NEURONTIN) 100 MG capsule Take 1 capsule (100 mg total) by mouth at bedtime. 06/02/22 07/02/22  Windell Norfolk, MD  lacosamide (VIMPAT) 200 MG TABS tablet Take 1 tablet (200 mg total) by mouth 2 (two) times daily. 06/22/22 01/18/23  Windell Norfolk, MD      Allergies    Patient has no known allergies.    Review of Systems   Review of Systems  Physical Exam Updated Vital Signs BP 125/85 (BP Location: Left Arm)   Pulse 70   Temp 97.8 F (36.6 C) (Oral)   Resp 15   Ht 6' (1.829 m)   Wt 83.5 kg   SpO2 98%   BMI 24.95 kg/m  Physical Exam  ED Results / Procedures / Treatments   Labs (all labs ordered are listed, but only abnormal results are displayed) Labs Reviewed  COMPREHENSIVE METABOLIC PANEL - Abnormal; Notable for the following components:      Result Value   Glucose, Bld 106 (*)    All other components within normal limits  CBC WITH DIFFERENTIAL/PLATELET  HIV ANTIBODY (ROUTINE TESTING W REFLEX)  VITAMIN B12  FOLATE  RPR  LYME DISEASE DNA BY PCR(BORRELIA BURG)  VITAMIN B6  PROTEIN ELECTROPHORESIS, SERUM  VITAMIN B1    EKG None  Radiology MR Brain W and Wo Contrast  Result Date: 07/13/2022 CLINICAL DATA:  History of seizures increasing over the last year, visual disturbance in left eye. EXAM: MRI HEAD AND ORBITS WITHOUT AND WITH CONTRAST TECHNIQUE:  Multiplanar, multiecho pulse sequences of the brain and surrounding structures were obtained without and with intravenous contrast. Multiplanar, multiecho pulse sequences of the orbits and surrounding structures were obtained including fat saturation techniques, before and after intravenous contrast administration. CONTRAST:  106mL GADAVIST GADOBUTROL 1 MMOL/ML IV SOLN COMPARISON:  CT head 02/27/2022 and brain MRI 05/26/2021 FINDINGS: MRI HEAD FINDINGS Brain: There is no acute intracranial hemorrhage, extra-axial fluid collection, or acute infarct. Parenchymal volume is normal. The ventricles are normal in size. Gray-white differentiation is preserved Parenchymal signal is normal. There is no structural or migration abnormality. The hippocampi are normal in signal and architecture. There is no abnormal enhancement. There is no mass lesion. There is no mass effect or midline shift. Vascular: Normal flow voids. Skull and upper cervical spine: Normal marrow signal. Other: None. MRI ORBITS FINDINGS Orbits: Right: The globe is intact. The extraocular muscles are normal. The orbital fat is preserved. The optic nerve is normal. Lacrimal gland is normal. There is no definite abnormal enhancement; however, evaluation is difficult due to non-fat saturated T1 postcontrast images. Left: The globe is intact. The extraocular muscles are normal. The orbital fat is preserved. The optic nerve is normal. Lacrimal gland is normal. There is no definite abnormal enhancement; however, evaluation is difficult due to non-fat saturated T1 postcontrast images. Visualized sinuses: Clear. Soft tissues:  Unremarkable. IMPRESSION: 1. Normal MRI of the brain. 2. Normal appearance of the globes and orbits; however, the T1 postcontrast fat images on the MR orbits are not fat saturated, degrading evaluation for enhancement of the optic nerves. If there is high clinical concern for optic neuritis, repeat MRI of the orbits with fat saturated postcontrast  imaging may be considered. Electronically Signed   By: Lesia Hausen M.D.   On: 07/13/2022 16:44   MR ORBITS W WO CONTRAST  Result Date: 07/13/2022 CLINICAL DATA:  History of seizures increasing over the last year, visual disturbance in left eye. EXAM: MRI HEAD AND ORBITS WITHOUT AND WITH CONTRAST TECHNIQUE: Multiplanar, multiecho pulse sequences of the brain and surrounding structures were obtained without and with intravenous contrast. Multiplanar, multiecho pulse sequences of the orbits and surrounding structures were obtained including fat saturation techniques, before and after intravenous contrast administration. CONTRAST:  68mL GADAVIST GADOBUTROL 1 MMOL/ML IV SOLN COMPARISON:  CT head 02/27/2022 and brain MRI 05/26/2021 FINDINGS: MRI HEAD FINDINGS Brain: There is no acute intracranial hemorrhage, extra-axial fluid collection, or acute infarct. Parenchymal volume is normal. The ventricles are normal in size. Gray-white differentiation is preserved Parenchymal signal is normal. There is no structural or migration abnormality. The hippocampi are normal in signal and architecture. There is no abnormal enhancement. There is no mass lesion. There is no mass effect or midline shift. Vascular: Normal flow voids. Skull and upper cervical spine: Normal marrow signal. Other: None. MRI ORBITS FINDINGS Orbits: Right: The globe is intact. The extraocular muscles are normal. The orbital fat is preserved. The optic nerve is normal. Lacrimal gland is normal. There is no definite abnormal enhancement; however, evaluation is difficult due to non-fat saturated T1 postcontrast images. Left: The globe is intact. The extraocular muscles are normal. The orbital fat is preserved. The optic nerve is normal. Lacrimal gland is normal. There is no definite abnormal enhancement; however, evaluation is difficult due to non-fat saturated T1 postcontrast images. Visualized sinuses: Clear. Soft tissues: Unremarkable. IMPRESSION: 1. Normal  MRI of the brain. 2. Normal appearance of the globes and orbits; however, the T1 postcontrast fat images on the MR orbits are not fat saturated, degrading evaluation for enhancement of the optic nerves. If there is high clinical concern for optic neuritis, repeat MRI of the orbits with fat saturated postcontrast imaging may be considered. Electronically Signed   By: Lesia Hausen M.D.   On: 07/13/2022 16:44    Procedures Procedures  {Document cardiac monitor, telemetry assessment procedure when appropriate:1}  Medications Ordered in ED Medications  gadobutrol (GADAVIST) 1 MMOL/ML injection 8 mL (8 mLs Intravenous Contrast Given 07/13/22 1613)  sodium chloride 0.9 % bolus 1,000 mL (0 mLs Intravenous Stopped 07/13/22 2257)  prochlorperazine (COMPAZINE) injection 10 mg (10 mg Intravenous Given 07/13/22 2008)  diphenhydrAMINE (BENADRYL) injection 50 mg (50 mg Intravenous Given 07/13/22 2008)    ED Course/ Medical Decision Making/ A&P                           Medical Decision Making Amount and/or Complexity of Data Reviewed Radiology: ordered.  Risk Prescription drug management.    Mario Davis is a 26 y.o. male with a past medical history significant for seizures who presents with transient left eye vision loss and continued intermittent extremity neurologic symptoms.  According to patient, he has been having a weeks of symptoms in his arms and legs intermittently with numbness and weakness.  He reports  that for the last week or so his arms have both felt weak but his legs feel normal now.  He reports he occasionally has neck pains and back pains but nothing persistent.  He does report he is having some headache now and reports that he woke up this morning with complete left eye vision loss.  He reports that it was black for many hours but then throughout the day it started to improve and then approximate 1 hour prior to my initial evaluation his vision has completely normalized.  He  reports some persistent headache that is slightly improved at a 7 out of 10.  He denies any fevers or chills.  Denies congestion, cough, nausea, vomiting, constipation, or diarrhea.  Denies speech difficulties.  Denies any other focal complaints.  Patient is being managed by his outpatient neurology team who recommended getting an MRI of the C-spine without contrast to further evaluate these radicular neck symptoms.  Patient from triage ended up getting a CT of the head and orbits to look for evidence of MS, tumor, stroke, or other cause of the vision changes.  On my exam, lungs were clear and chest was nontender.  Abdomen was nontender.  Normal finger-nose-finger testing bilaterally.  Pupils are symmetric and reactive with normal extract movements.  No eye tenderness.  No temporal tenderness.  No redness seen.  Exam otherwise unremarkable.  Intact sensation in arms and legs.  Intact strength in the legs.  Patient subjectively felt that both of his grip strength were decreased but they were symmetric.  He had negative Hoffmann sign in both hands.  Otherwise intact pulses.  Exam otherwise reassuring.  MRI of the head and orbits did not show evidence of acute stroke or MS.  Reportedly the imaging was not perfect to look at the fat changes near the orbits and MS could not definitely be ruled out initially.  I spoke to Dr. Nemiah Commander now with neurology who did not feel that repeat MRI be necessary of the brain but did feel that MRI of the C-spine would be indicated given his arm symptoms.  Care will be transferred to oncoming team to await for results of MRI C-spine.  Anticipate discussion with neurology again after imaging is completed and patient is reassessed.  As patient's care is being transferred, patient says he wants to go home.  He does not want to wait for MRI and he reports his headache is gone.  No other symptoms reported.  He would like to follow-up with outpatient neurology team.  We discussed the  need to follow-up on the other lab results that should return over the next 24 hours and he understands.  He is feeling better and will be discharged home.  He understood return precautions and was discharged in good condition.   {Document critical care time when appropriate:1} {Document review of labs and clinical decision tools ie heart score, Chads2Vasc2 etc:1}  {Document your independent review of radiology images, and any outside records:1} {Document your discussion with family members, caretakers, and with consultants:1} {Document social determinants of health affecting pt's care:1} {Document your decision making why or why not admission, treatments were needed:1} Final Clinical Impression(s) / ED Diagnoses Final diagnoses:  Transient vision disturbance of left eye  Nonintractable headache, unspecified chronicity pattern, unspecified headache type    Clinical Impression: 1. Transient vision disturbance of left eye   2. Nonintractable headache, unspecified chronicity pattern, unspecified headache type     Disposition: Discharge  Condition: Good  I have  discussed the results, Dx and Tx plan with the pt(& family if present). He/she/they expressed understanding and agree(s) with the plan. Discharge instructions discussed at great length. Strict return precautions discussed and pt &/or family have verbalized understanding of the instructions. No further questions at time of discharge.    New Prescriptions   No medications on file    Follow Up: YOUR NEUROLOGIST

## 2022-07-13 NOTE — ED Notes (Signed)
Patient verbalizes understanding of d/c instructions. Opportunities for questions and answers were provided. Pt d/c from ED and ambulated to lobby.  

## 2022-07-13 NOTE — ED Triage Notes (Signed)
Reports he woke up this am and couldn't see out of left eye but now is seeing shadows and spots.  Reports also back pain neck pain and headaches.  Patient reports he was taking lacmitcal and now has been switched to lacosamine 200mg  and still having headaches.

## 2022-07-13 NOTE — ED Notes (Signed)
Pt ambulated to restroom independently without difficulties or complaints.

## 2022-07-13 NOTE — ED Provider Notes (Signed)
  Provider Note MRN:  [id number]  Arrival date & time: 07/13/22    ED Course and Medical Decision Making  Assumed care from Dr. Rush Landmark at shift change.  Visual disturbance, transient grip strength issue awaiting MRI cervical spine and will reach out again to neurology after results.  Procedures  Final Clinical Impressions(s) / ED Diagnoses     ICD-10-CM   1. Transient vision disturbance of left eye  H53.9     2. Nonintractable headache, unspecified chronicity pattern, unspecified headache type  R51.9       ED Discharge Orders     None       Discharge Instructions   None     Elmer Sow. Pilar Plate, MD [facility] [email or web address]    Sabas Sous, MD 07/14/22 [phone]

## 2022-07-14 LAB — RPR: RPR Ser Ql: NONREACTIVE

## 2022-07-16 ENCOUNTER — Telehealth: Payer: Self-pay | Admitting: Neurology

## 2022-07-16 LAB — VITAMIN B6: Vitamin B6: 10.6 ug/L (ref 3.4–65.2)

## 2022-07-16 LAB — VITAMIN B1: Vitamin B1 (Thiamine): 140.2 nmol/L (ref 66.5–200.0)

## 2022-07-16 NOTE — Telephone Encounter (Signed)
[facility] medicaid is requiring a peer to peer be done in order to authorize his MRI. Please call [phone] case # [id number]

## 2022-07-20 LAB — PROTEIN ELECTROPHORESIS, SERUM
A/G Ratio: 1.2 (ref 0.7–1.7)
Albumin ELP: 4 g/dL (ref 2.9–4.4)
Alpha-1-Globulin: 0.2 g/dL (ref 0.0–0.4)
Alpha-2-Globulin: 0.6 g/dL (ref 0.4–1.0)
Beta Globulin: 1.3 g/dL (ref 0.7–1.3)
Gamma Globulin: 1.2 g/dL (ref 0.4–1.8)
Globulin, Total: 3.3 g/dL (ref 2.2–3.9)
Total Protein ELP: 7.3 g/dL (ref 6.0–8.5)

## 2022-07-20 NOTE — Telephone Encounter (Signed)
[employer] denied the MRI, will give pod 2 the denial/appeal letter.

## 2022-07-29 NOTE — Telephone Encounter (Signed)
[facility] medicaid is requiring a peer to peer be done in order to authorize his MRI.  Since MRI was denied pt was unable to complete the scan. I will have Dr. Teresa Coombs review this message and see if any changes need to be made.

## 2022-08-02 NOTE — Telephone Encounter (Signed)
I called [facility] medicaid, they require patient to complete 6 weeks of physical therapy first.

## 2022-08-05 ENCOUNTER — Telehealth: Payer: Self-pay

## 2022-08-05 DIAGNOSIS — R200 Anesthesia of skin: Secondary | ICD-10-CM

## 2022-08-05 DIAGNOSIS — M5412 Radiculopathy, cervical region: Secondary | ICD-10-CM

## 2022-08-05 NOTE — Telephone Encounter (Signed)
Medicaid denied the patient's MRI. This was denied because the insurance is requesting he complete 6 weeks of phyiscal therapy first. PT orders have been added.

## 2022-08-11 ENCOUNTER — Other Ambulatory Visit: Payer: Self-pay

## 2022-08-11 ENCOUNTER — Encounter: Payer: Self-pay | Admitting: Physical Therapy

## 2022-08-11 ENCOUNTER — Ambulatory Visit: Payer: Medicaid Other | Attending: Neurology | Admitting: Physical Therapy

## 2022-08-11 VITALS — BP 132/70 | HR 80

## 2022-08-11 DIAGNOSIS — M5412 Radiculopathy, cervical region: Secondary | ICD-10-CM | POA: Insufficient documentation

## 2022-08-11 DIAGNOSIS — M542 Cervicalgia: Secondary | ICD-10-CM | POA: Diagnosis present

## 2022-08-11 DIAGNOSIS — R252 Cramp and spasm: Secondary | ICD-10-CM | POA: Diagnosis present

## 2022-08-11 DIAGNOSIS — R200 Anesthesia of skin: Secondary | ICD-10-CM | POA: Insufficient documentation

## 2022-08-11 NOTE — Therapy (Signed)
OUTPATIENT PHYSICAL THERAPY CERVICAL EVALUATION   Patient Name: Mario Davis MRN: [id number] DOB:December 15, 1995, 26 y.o., male Today's Date: 08/11/2022   PT End of Session - 08/11/22 1544     Visit Number 1    Number of Visits 7   6+eval   Date for PT Re-Evaluation 10/01/22   pushed out due to limitations of pt's schedule   Authorization Type [place]    PT Start Time 0160   PT ran over w/ pt prior   PT Stop Time 1624    PT Time Calculation (min) 46 min    Activity Tolerance Patient tolerated treatment well    Behavior During Therapy WFL for tasks assessed/performed             Past Medical History:  Diagnosis Date   Seizures ([place])    History reviewed. No pertinent surgical history. Patient Active Problem List   Diagnosis Date Noted   Seizure ([place]) 05/26/2021   Abdominal pain in male 04/14/2015    PCP: Maximiano Coss, NP  REFERRING PROVIDER: Alric Ran, MD   REFERRING DIAG: [phone] (ICD-10-CM) - Cervical radiculopathy R20.0 (ICD-10-CM) - Numbness   THERAPY DIAG:  Radiculopathy, cervical region - Plan: PT plan of care cert/re-cert  Cervicalgia - Plan: PT plan of care cert/re-cert  Cramp and spasm - Plan: PT plan of care cert/re-cert  Rationale for Evaluation and Treatment Rehabilitation  ONSET DATE: 08/05/2022   SUBJECTIVE:                                                                                                                                                                                                         SUBJECTIVE STATEMENT: Pt is compliant to seizure medication regimen with most recent seizure being 2 days ago.  He is unable to identify triggers, but typically his throat begins to hurt just before he has one.  The numbness in right hand and foot began 2 months ago.  The numbness runs from posterior elbow into the dorsal aspect of fingers and hand.  In the morning he cannot feel hands or arm.  "I'm like  this for like 2-3 hours."  This feeling never completely goes away.  He feels his grip is affected.  He is right handed, ambidextrous.  PERTINENT HISTORY:  Epilepsy  PAIN:  Are you having pain? Yes: NPRS scale: 7/10 Pain location: right arm and hand Pain description: numb, stabbing, pin-pricks Aggravating factors: unsure Relieving factors: nothing  PRECAUTIONS: None  WEIGHT BEARING RESTRICTIONS No  FALLS:  Has patient fallen in  last 6 months? No  LIVING ENVIRONMENT: Lives with: lives with their spouse-2dogs Lives in: House/apartment Stairs: No Has following equipment at home: Crutches  OCCUPATION: certified forklift driver-still working full time, he has been missing days (has not been back since Sunday)  PLOF: Independent  PATIENT GOALS "To figure out what's causing all of this pain."  OBJECTIVE:   DIAGNOSTIC FINDINGS:  MRI has been ordered-date unknown. CT and MRI are unremarkable.  From MD note: "MRI of the head and orbits did not show evidence of acute stroke or MS. Reportedly the imaging was not perfect to look at the fat changes near the orbits and MS could not definitely be ruled out initially. I spoke to Dr. Nemiah Commander now with neurology who did not feel that repeat MRI be necessary of the brain but did feel that MRI of the C-spine would be indicated given his arm symptoms."  PATIENT SURVEYS:  NDI to be scored.   COGNITION: Overall cognitive status: Within functional limits for tasks assessed and pt does state he had issues counting at work last week.   SENSATION: Light touch: Impaired -pt unable to identify light touch on dorsal aspect of right forarm from elbow level to wrist, could identify dorsal aspect of fingers.  POSTURE: No Significant postural limitations  PALPATION: TTP at TP of C2, right acromion, right rhomboids, and right subacromial space not directly over coracoid process   CERVICAL ROM:   Active ROM A/PROM (deg) eval  Flexion 32   Extension 11  Right lateral flexion 41; pt endorses anterior shoulder discomfort  Left lateral flexion 47  Right rotation 16; mild pain in upper paraspinals  Left rotation 39   (Blank rows = not tested)  UPPER EXTREMITY ROM:  Active ROM Right eval Left eval  Shoulder flexion [facility]  Shoulder extension    Shoulder abduction " "  Shoulder adduction " "  Shoulder extension    Shoulder internal rotation    Shoulder external rotation    Elbow flexion " "  Elbow extension " "  Wrist flexion " "  Wrist extension " "  Wrist ulnar deviation    Wrist radial deviation    Wrist pronation " "  Wrist supination " "   (Blank rows = not tested)  UPPER EXTREMITY MMT:  MMT Right eval Left eval  Shoulder flexion 4+/5 4+/5  Shoulder extension    Shoulder abduction 4+/5 4+/5  Shoulder adduction    Shoulder extension    Shoulder internal rotation    Shoulder external rotation    Middle trapezius    Lower trapezius    Elbow flexion 5/5 5/5  Elbow extension    Wrist flexion    Wrist extension    Wrist ulnar deviation    Wrist radial deviation    Wrist pronation    Wrist supination    Grip strength 18.5 lb 93.6 lb   (Blank rows = not tested)  CERVICAL SPECIAL TESTS:  Upper limb tension test (ULTT): Negative, Spurling's test: Negative, Distraction test: Negative, and ROOS:  Negative, Right arm feels hot; Neers and [place] Asal:  negative   FUNCTIONAL TESTS:  None completed.   TODAY'S TREATMENT:  N/A   PATIENT EDUCATION:  Education details: PT POC, assessments used, and goals to be set. Person educated: Patient Education method: Explanation Education comprehension: verbalized understanding   HOME EXERCISE PROGRAM: To be initiated.  ASSESSMENT:  CLINICAL IMPRESSION: Patient is a 26 y.o. male who was seen today for physical therapy evaluation  and treatment for cervical radiculopathy affecting the right arm.  Pt has a significant PMH of epilepsy.  Identified  impairments include right UE weakness most affecting dominant grip, pain and paresthesias of the dorsal forearm, decreased cervical ROM right worse than left, and reported muscle cramping in cervical region and legs.  All cervical, shoulder, and nerve testing was negative and did not reproduce consistent pain or symptom response.  Pt is awaiting further imaging of the cervical spine following PT.  He would benefit from skilled PT to address impairments as noted and progress towards long term goals.  OBJECTIVE IMPAIRMENTS decreased ROM, decreased strength, hypomobility, increased muscle spasms, impaired flexibility, impaired sensation, impaired UE functional use, and pain.   ACTIVITY LIMITATIONS carrying and lifting  PARTICIPATION LIMITATIONS: driving and occupation  PERSONAL FACTORS Profession, Time since onset of injury/illness/exacerbation, and 1 comorbidity: epilepsy  are also affecting patient's functional outcome.   REHAB POTENTIAL: Good  CLINICAL DECISION MAKING: Evolving/moderate complexity  EVALUATION COMPLEXITY: Moderate   GOALS: Goals reviewed with patient? Yes  SHORT TERM GOALS: Target date: 09/03/2022  Pt will be independent with cervical and UE strength and stretching HEP. Baseline: To be established. Goal status: INITIAL  2.  NDI to be assessed w/ LTG set. Baseline: To be assessed. Goal status: INITIAL  3.  Pt will reports pain of no greater than 5/10 at rest to demonstrate improved quality of life. Baseline: 7/10 at rest. Goal status: INITIAL  LONG TERM GOALS: Target date: 09/24/2022  NDI to be assessed w/ LTG set as appropriate. Baseline: To be assessed. Goal status: INITIAL  2.  Pt will demonstrate an improved grip strength to >/= 30 lbs on the right to demonstrate functional improvement in strength. Baseline: 18.5 lbs on right Goal status: INITIAL  3.  Pt will report pain of no greater than 5/10 during activity involving cervical and UE movement to  improve functional capacity and tolerance to activity. Baseline: 7/10 at rest aggravated by movement. Goal status: INITIAL   PLAN: PT FREQUENCY: 1x/week  PT DURATION: 6 weeks  PLANNED INTERVENTIONS: Therapeutic exercises, Therapeutic activity, Neuromuscular re-education, Balance training, Gait training, Patient/Family education, Self Care, Joint mobilization, Dry Needling, Electrical stimulation, Manual therapy, and Re-evaluation  PLAN FOR NEXT SESSION: MMT wrist flexion/extension and finger abd, assess NDI and set goal, initiate HEP for cervical stretching and strengthening, try nerve glides and cervical myofascial techniques.   Sadie Haber, PT, DPT 08/11/2022, 5:05 PM

## 2022-08-18 ENCOUNTER — Ambulatory Visit: Payer: Medicaid Other | Attending: Neurology | Admitting: Physical Therapy

## 2022-08-18 ENCOUNTER — Encounter: Payer: Self-pay | Admitting: Physical Therapy

## 2022-08-18 DIAGNOSIS — R252 Cramp and spasm: Secondary | ICD-10-CM | POA: Diagnosis present

## 2022-08-18 DIAGNOSIS — M5412 Radiculopathy, cervical region: Secondary | ICD-10-CM | POA: Insufficient documentation

## 2022-08-18 DIAGNOSIS — M542 Cervicalgia: Secondary | ICD-10-CM | POA: Diagnosis present

## 2022-08-18 NOTE — Patient Instructions (Signed)
Access Code: [email or web address] URL: [email or web address] Date: 08/18/2022 Prepared by: Elease Etienne  Exercises - Seated Upper Trapezius Stretch  - 1 x daily - 7 x weekly - 3 sets - 10 reps - Seated Levator Scapulae Stretch  - 1 x daily - 7 x weekly - 3 sets - 10 reps - Sternocleidomastoid Stretch  - 1 x daily - 7 x weekly - 3 sets - 10 reps - Chin Tuck Against Resistance with Towel  - 1 x daily - 7 x weekly - 3 sets - 10 reps - Seated Cervical Retraction  - 1 x daily - 7 x weekly - 3 sets - 10 reps - Radial Nerve Flossing  - 1 x daily - 7 x weekly - 2 sets - 10 reps - Seated Pronation Supination with Dumbbell  - 1 x daily - 7 x weekly - 3 sets - 10 reps

## 2022-08-18 NOTE — Therapy (Signed)
OUTPATIENT PHYSICAL THERAPY TREATMENT NOTE   Patient Name: Mario Davis MRN: [id number] DOB:[birth year], 26 y.o., male Today's Date: 08/18/2022  PCP: Maximiano Coss, NP   REFERRING PROVIDER: Alric Ran, MD   END OF SESSION:   PT End of Session - 08/18/22 1454     Visit Number 2    Number of Visits 7   6+eval   Date for PT Re-Evaluation 10/01/22   pushed out due to limitations of pt's schedule   Authorization Type [place]    PT Start Time 9373   pt late   PT Stop Time 1528    PT Time Calculation (min) 36 min    Activity Tolerance Patient tolerated treatment well    Behavior During Therapy Flat affect             Past Medical History:  Diagnosis Date   Seizures ([place])    History reviewed. No pertinent surgical history. Patient Active Problem List   Diagnosis Date Noted   Seizure ([place]) 05/26/2021   Abdominal pain in male 04/14/2015    REFERRING DIAG: M54.12 (ICD-10-CM) - Cervical radiculopathy R20.0 (ICD-10-CM) - Numbness   THERAPY DIAG:  Radiculopathy, cervical region  Cervicalgia  Cramp and spasm  Rationale for Evaluation and Treatment Rehabilitation  PERTINENT HISTORY: Epilepsy  PRECAUTIONS: None  SUBJECTIVE: "Everything is still the same."  PAIN:  Are you having pain? Yes: NPRS scale: 6/10 Pain location: right lateral aspect of neck wrapping around to right medial collarbone Pain description: pressure "like somebody is putting their hand right there" Aggravating factors: lifting the arm, grabbing objects Relieving factors: nothing   OBJECTIVE: (objective measures completed at initial evaluation unless otherwise dated)   TODAY'S TREATMENT:  -Administed NDI, pt scored 60% indicating moderate perceived disability.  -Assessed MMT: Right wrist ext 3-/5 Left wrist ext 5/5  Right wrist flexion 3+/5 Left wrist flexion 4+/5  Right finger abd 2+/5 Left finger abd 3+/5    Right trap stretch x45  sec  Right levator stretch x45 sec  Right SCM stretch x45 sec Brief right cervical screen in rotation/ext w/o symptoms   Seated cervical retractions x15 w/ 1-2 sec hold, returned demo from therapist  Seated chin tucks against washcloth for resistance x10 w/ 2 second hold Radial nerve flossing x15, pt reports dorsal forearm symptoms Ulnar nerve flossing x10, pt not symptomatic Supination/pronation w/ 2# dumbbell, pt able to grip loosely, instructed to do over table in event he loses grip.  PATIENT EDUCATION:  Education details: Initial HEP, edu on nerves running through the muscles being stretched and down into the hand that could impact weakness.  Discussion of consistent attempt w/ HEP at home to promote best chance at success managing symptoms as it is obvious to therapist that pt does not buy-in to process. Person educated: Patient Education method: Explanation Education comprehension: verbalized understanding     HOME EXERCISE PROGRAM: Access Code: [email or web address] URL: [email or web address] Date: 08/18/2022 Prepared by: Elease Etienne  Exercises - Seated Upper Trapezius Stretch  - 1 x daily - 7 x weekly - 3 sets - 10 reps - Seated Levator Scapulae Stretch  - 1 x daily - 7 x weekly - 3 sets - 10 reps - Sternocleidomastoid Stretch  - 1 x daily - 7 x weekly - 3 sets - 10 reps - Chin Tuck Against Resistance with Towel  - 1 x daily - 7 x weekly - 3 sets - 10 reps - Seated Cervical Retraction  - 1 x  daily - 7 x weekly - 3 sets - 10 reps - Radial Nerve Flossing  - 1 x daily - 7 x weekly - 2 sets - 10 reps - Seated Pronation Supination with Dumbbell  - 1 x daily - 7 x weekly - 3 sets - 10 reps   ASSESSMENT:   CLINICAL IMPRESSION: Pt provides little verbal feedback to therapist throughout session similar to evaluation.  Provided initial HEP to patient with good response to radial nerve flossing and supination/pronation activities.  Wrist extension and finger abduction on the  right UE are weak, but pt does not exhibit wrist drop at this time.  PT to continue ongoing assessment of symptoms and response to interventions targeted at radial nerve and innervated muscle groups.  Discussion with pt about consistency with exercises to promote best functional outcome.   OBJECTIVE IMPAIRMENTS decreased ROM, decreased strength, hypomobility, increased muscle spasms, impaired flexibility, impaired sensation, impaired UE functional use, and pain.    ACTIVITY LIMITATIONS carrying and lifting   PARTICIPATION LIMITATIONS: driving and occupation   PERSONAL FACTORS Profession, Time since onset of injury/illness/exacerbation, and 1 comorbidity: epilepsy  are also affecting patient's functional outcome.    REHAB POTENTIAL: Good   CLINICAL DECISION MAKING: Evolving/moderate complexity   EVALUATION COMPLEXITY: Moderate     GOALS: Goals reviewed with patient? Yes   SHORT TERM GOALS: Target date: 09/03/2022   Pt will be independent with cervical and UE strength and stretching HEP. Baseline: To be established. Goal status: INITIAL   2.  NDI to be assessed w/ LTG set. Baseline: Assessed. Goal status: MET   3.  Pt will reports pain of no greater than 5/10 at rest to demonstrate improved quality of life. Baseline: 7/10 at rest. Goal status: INITIAL   LONG TERM GOALS: Target date: 09/24/2022   Pt will decrease NDI score to </=40% in order to demonstrate subjective functional improvement and decreased interference of neck pain in daily life.  Baseline:  60% Goal status: INITIAL   2.  Pt will demonstrate an improved grip strength to >/= 30 lbs on the right to demonstrate functional improvement in strength. Baseline: 18.5 lbs on right Goal status: INITIAL   3.  Pt will report pain of no greater than 5/10 during activity involving cervical and UE movement to improve functional capacity and tolerance to activity. Baseline: 7/10 at rest aggravated by movement. Goal status:  INITIAL     PLAN: PT FREQUENCY: 1x/week   PT DURATION: 6 weeks   PLANNED INTERVENTIONS: Therapeutic exercises, Therapeutic activity, Neuromuscular re-education, Balance training, Gait training, Patient/Family education, Self Care, Joint mobilization, Dry Needling, Electrical stimulation, Manual therapy, and Re-evaluation   PLAN FOR NEXT SESSION: Modify HEP prn for cervical stretching and strengthening, periscapular and UE weight bearing tasks and cervical myofascial techniques.   Bary Richard, PT, DPT 08/18/2022, 4:48 PM

## 2022-08-25 ENCOUNTER — Ambulatory Visit: Payer: Medicaid Other | Admitting: Physical Therapy

## 2022-08-25 ENCOUNTER — Encounter: Payer: Self-pay | Admitting: Physical Therapy

## 2022-08-25 DIAGNOSIS — M5412 Radiculopathy, cervical region: Secondary | ICD-10-CM | POA: Diagnosis not present

## 2022-08-25 DIAGNOSIS — R252 Cramp and spasm: Secondary | ICD-10-CM

## 2022-08-25 DIAGNOSIS — M542 Cervicalgia: Secondary | ICD-10-CM

## 2022-08-25 NOTE — Patient Instructions (Signed)
Access Code: [email or web address] URL: [email or web address] Date: 08/25/2022 Prepared by: Elease Etienne  Exercises - Seated Upper Trapezius Stretch  - 1 x daily - 7 x weekly - 3 sets - 10 reps - Seated Levator Scapulae Stretch  - 1 x daily - 7 x weekly - 3 sets - 10 reps - Sternocleidomastoid Stretch  - 1 x daily - 7 x weekly - 3 sets - 10 reps - Chin Tuck Against Resistance with Towel  - 1 x daily - 7 x weekly - 3 sets - 10 reps - Seated Cervical Retraction  - 1 x daily - 7 x weekly - 3 sets - 10 reps - Radial Nerve Flossing  - 1 x daily - 7 x weekly - 2 sets - 10 reps - Seated Pronation Supination with Dumbbell  - 1 x daily - 7 x weekly - 3 sets - 10 reps - Mid-Lower Cervical Extension SNAG with Strap  - 1 x daily - 7 x weekly - 2 sets - 10 reps - Putty Squeezes  - 1 x daily - 7 x weekly - 3 sets - 10 reps - 3-Point Pinch with Putty  - 1 x daily - 7 x weekly - 3 sets - 10 reps - Seated Finger Abduction with Putty  - 1 x daily - 7 x weekly - 3 sets - 10 reps

## 2022-08-25 NOTE — Therapy (Addendum)
OUTPATIENT PHYSICAL THERAPY TREATMENT NOTE   Patient Name: Mario Davis MRN: [id number] DOB:Oct 22, 1996, 26 y.o., male Today's Date: 08/25/2022  PCP: Maximiano Coss, NP   REFERRING PROVIDER: Alric Ran, MD   END OF SESSION:   PT End of Session - 08/25/22 1023     Visit Number 3    Number of Visits 7   6+eval   Date for PT Re-Evaluation 10/01/22   pushed out due to limitations of pt's schedule   Authorization Type [place] [employer]ICA[employer]AN    PT Start Time 1020    PT Stop Time 1101    PT Time Calculation (min) 41 min    Activity Tolerance Patient tolerated treatment well    Behavior During Therapy WFL for tasks assessed/performed             Past Medical History:  Diagnosis Date   Seizures ([place])    History reviewed. No pertinent surgical history. Patient Active Problem List   Diagnosis Date Noted   Seizure ([place]) 05/26/2021   Abdominal pain in male 04/14/2015    REFERRING DIAG: M54.12 (ICD-10-CM) - Cervical radiculopathy R20.0 (ICD-10-CM) - Numbness   THERAPY DIAG:  Radiculopathy, cervical region  Cervicalgia  Cramp and spasm  Rationale for Evaluation and Treatment Rehabilitation  PERTINENT HISTORY: Epilepsy  PRECAUTIONS: None  SUBJECTIVE: When asked how pt is he responds "not good".  He states his neck is hurting.  He has been playing basketball on the weekends non-competitively, but has no other change in activity and he states this does not impact his pain positively or negatively.  PAIN:  Are you having pain? Yes: NPRS scale: 8/10 Pain location: right lateral aspect of neck wrapping around to right medial collarbone Pain description: fingertip pressure -demos fingers pressing into palm repeatedly Aggravating factors: lifting the arm, grabbing objects Relieving factors: nothing   OBJECTIVE: (objective measures completed at initial evaluation unless otherwise dated)   TODAY'S TREATMENT:  -Reassessed right cervical region  and upper trap, hypertonicity noted, no trigger points, but symmetrical compared to left. -Pt TTP in right suboccipital region and medial upper trap region w/o radiation into UE w/ pressure applied. -Pt in supine, PT performs manual suboccipital release > myofascial release to upper trap w/ stretching right upper trap, levator, and SCM w/ pt endorsing 10/10 pain so activity d/c'd. -In supine pt performs cervical extension over rolled towel to promote ROM and myofascial release of anterior chain. -Cervical extension SNAGs w/ towel x10, pt reports less pressure in neck- added to HEP -Cervical rotation SNAGs w/ towel x5 each direction, pt reports no relief of symptoms compared to extension -Eccentric wrist extension curls w/ 2# x12, ROM WNL. -Eccentric wrist flexion curls w/ 2# x12 RUE only -Putty squeezes x12, pink putty -Putty tripod pinch x15, pink putty -Putty finger abduction x15, pink putty  PATIENT EDUCATION:  Education details: Continue HEP w/ additions, vary activity based on what is most bothersome/challenging.  Follow-up with doctor about possible keloid/bump on right earlobe that pt states is contributing to his neck pain proximally. Person educated: Patient Education method: Explanation Education comprehension: verbalized understanding     HOME EXERCISE PROGRAM: Access Code: [email or web address] URL: [email or web address] Date: 08/18/2022 Prepared by: Elease Etienne  Exercises - Seated Upper Trapezius Stretch  - 1 x daily - 7 x weekly - 3 sets - 10 reps - Seated Levator Scapulae Stretch  - 1 x daily - 7 x weekly - 3 sets - 10 reps - Sternocleidomastoid Stretch  - 1  x daily - 7 x weekly - 3 sets - 10 reps - Chin Tuck Against Resistance with Towel  - 1 x daily - 7 x weekly - 3 sets - 10 reps - Seated Cervical Retraction  - 1 x daily - 7 x weekly - 3 sets - 10 reps - Radial Nerve Flossing  - 1 x daily - 7 x weekly - 2 sets - 10 reps - Seated Pronation Supination with  Dumbbell  - 1 x daily - 7 x weekly - 3 sets - 10 reps - Mid-Lower Cervical Extension SNAG with Strap  - 1 x daily - 7 x weekly - 2 sets - 10 reps - Putty Squeezes  - 1 x daily - 7 x weekly - 3 sets - 10 reps - 3-Point Pinch with Putty  - 1 x daily - 7 x weekly - 3 sets - 10 reps - Seated Finger Abduction with Putty  - 1 x daily - 7 x weekly - 3 sets - 10 reps   ASSESSMENT:   CLINICAL IMPRESSION: Pt continues to be limited in verbal feedback to therapist, but does report some relief w/ cervical extension SNAGs this session.  His grip and finger abduction continue to be limited.  Therapist provides advancements to HEP to address these deficits as able.  Will continue per POC.   OBJECTIVE IMPAIRMENTS decreased ROM, decreased strength, hypomobility, increased muscle spasms, impaired flexibility, impaired sensation, impaired UE functional use, and pain.    ACTIVITY LIMITATIONS carrying and lifting   PARTICIPATION LIMITATIONS: driving and occupation   PERSONAL FACTORS Profession, Time since onset of injury/illness/exacerbation, and 1 comorbidity: epilepsy  are also affecting patient's functional outcome.    REHAB POTENTIAL: Good   CLINICAL DECISION MAKING: Evolving/moderate complexity   EVALUATION COMPLEXITY: Moderate     GOALS: Goals reviewed with patient? Yes   SHORT TERM GOALS: Target date: 09/03/2022   Pt will be independent with cervical and UE strength and stretching HEP. Baseline: To be established. Goal status: INITIAL   2.  NDI to be assessed w/ LTG set. Baseline: Assessed. Goal status: MET   3.  Pt will reports pain of no greater than 5/10 at rest to demonstrate improved quality of life. Baseline: 7/10 at rest. Goal status: INITIAL   LONG TERM GOALS: Target date: 09/24/2022   Pt will decrease NDI score to </=40% in order to demonstrate subjective functional improvement and decreased interference of neck pain in daily life.  Baseline:  60% Goal status: INITIAL   2.   Pt will demonstrate an improved grip strength to >/= 30 lbs on the right to demonstrate functional improvement in strength. Baseline: 18.5 lbs on right Goal status: INITIAL   3.  Pt will report pain of no greater than 5/10 during activity involving cervical and UE movement to improve functional capacity and tolerance to activity. Baseline: 7/10 at rest aggravated by movement. Goal status: INITIAL     PLAN: PT FREQUENCY: 1x/week   PT DURATION: 6 weeks   PLANNED INTERVENTIONS: Therapeutic exercises, Therapeutic activity, Neuromuscular re-education, Balance training, Gait training, Patient/Family education, Self Care, Joint mobilization, Dry Needling, Electrical stimulation, Manual therapy, and Re-evaluation   PLAN FOR NEXT SESSION: Assess STGs!  Modify HEP prn for cervical stretching and strengthening, periscapular and UE weight bearing tasks and cervical myofascial techniques.   Bary Richard, PT, DPT 08/25/2022, 1:33 PM

## 2022-09-01 ENCOUNTER — Emergency Department (HOSPITAL_COMMUNITY): Payer: Medicaid Other

## 2022-09-01 ENCOUNTER — Encounter: Payer: Self-pay | Admitting: Physical Therapy

## 2022-09-01 ENCOUNTER — Emergency Department (HOSPITAL_COMMUNITY)
Admission: EM | Admit: 2022-09-01 | Discharge: 2022-09-01 | Disposition: A | Discharge status: Home / Self Care | Payer: Medicaid Other | Attending: Emergency Medicine | Admitting: Emergency Medicine

## 2022-09-01 ENCOUNTER — Other Ambulatory Visit: Payer: Self-pay

## 2022-09-01 ENCOUNTER — Encounter (HOSPITAL_COMMUNITY): Payer: Self-pay

## 2022-09-01 ENCOUNTER — Ambulatory Visit: Payer: Medicaid Other | Admitting: Physical Therapy

## 2022-09-01 DIAGNOSIS — M542 Cervicalgia: Secondary | ICD-10-CM | POA: Insufficient documentation

## 2022-09-01 DIAGNOSIS — Z5181 Encounter for therapeutic drug level monitoring: Secondary | ICD-10-CM | POA: Diagnosis not present

## 2022-09-01 DIAGNOSIS — J029 Acute pharyngitis, unspecified: Secondary | ICD-10-CM | POA: Diagnosis not present

## 2022-09-01 DIAGNOSIS — R0981 Nasal congestion: Secondary | ICD-10-CM | POA: Insufficient documentation

## 2022-09-01 DIAGNOSIS — R059 Cough, unspecified: Secondary | ICD-10-CM | POA: Insufficient documentation

## 2022-09-01 DIAGNOSIS — Z20822 Contact with and (suspected) exposure to covid-19: Secondary | ICD-10-CM | POA: Insufficient documentation

## 2022-09-01 DIAGNOSIS — R519 Headache, unspecified: Secondary | ICD-10-CM | POA: Diagnosis not present

## 2022-09-01 DIAGNOSIS — R569 Unspecified convulsions: Secondary | ICD-10-CM

## 2022-09-01 DIAGNOSIS — R252 Cramp and spasm: Secondary | ICD-10-CM

## 2022-09-01 DIAGNOSIS — G40909 Epilepsy, unspecified, not intractable, without status epilepticus: Secondary | ICD-10-CM | POA: Insufficient documentation

## 2022-09-01 DIAGNOSIS — M5412 Radiculopathy, cervical region: Secondary | ICD-10-CM

## 2022-09-01 LAB — CBC WITH DIFFERENTIAL/PLATELET

## 2022-09-01 LAB — COMPREHENSIVE METABOLIC PANEL
ALT: 18 U/L (ref 0–44)
AST: 30 U/L (ref 15–41)
Albumin: 3.9 g/dL (ref 3.5–5.0)
Alkaline Phosphatase: 82 U/L (ref 38–126)
Anion gap: 13 (ref 5–15)
BUN: 11 mg/dL (ref 6–20)
CO2: 18 mmol/L — ABNORMAL LOW (ref 22–32)
Calcium: 9.2 mg/dL (ref 8.9–10.3)
Chloride: 107 mmol/L (ref 98–111)
Creatinine, Ser: 1.12 mg/dL (ref 0.61–1.24)
GFR, Estimated: >60 mL/min (ref >60)
Glucose, Bld: 109 mg/dL — ABNORMAL HIGH (ref 70–99)
Potassium: 3.5 mmol/L (ref 3.5–5.1)
Sodium: 138 mmol/L (ref 135–145)
Total Bilirubin: 0.7 mg/dL (ref 0.3–1.2)
Total Protein: 7.3 g/dL (ref 6.5–8.1)

## 2022-09-01 LAB — CBC
HCT: 41 % (ref 39.0–52.0)
Hemoglobin: 14.6 g/dL (ref 13.0–17.0)
MCH: 30.5 pg (ref 26.0–34.0)
MCHC: 35.6 g/dL (ref 30.0–36.0)
MCV: 85.8 fL (ref 80.0–100.0)
Platelets: 308 10*3/uL (ref 150–400)
RBC: 4.78 MIL/uL (ref 4.22–5.81)
RDW: 13 % (ref 11.5–15.5)
WBC: 15.5 10*3/uL — ABNORMAL HIGH (ref 4.0–10.5)
nRBC: 0 % (ref 0.0–0.2)

## 2022-09-01 LAB — RESP PANEL BY RT-PCR (FLU A&B, COVID) ARPGX2
Influenza A by PCR: NEGATIVE
Influenza B by PCR: NEGATIVE
SARS Coronavirus 2 by RT PCR: NEGATIVE

## 2022-09-01 LAB — MAGNESIUM: Magnesium: 2.5 mg/dL — ABNORMAL HIGH (ref 1.7–2.4)

## 2022-09-01 LAB — CBG MONITORING, ED: Glucose-Capillary: 103 mg/dL — ABNORMAL HIGH (ref 70–99)

## 2022-09-01 MED ORDER — MORPHINE SULFATE (PF) 4 MG/ML IV SOLN
4.0000 mg | Freq: Once | INTRAVENOUS | Status: AC
  Administered 2022-09-01: 4 mg via INTRAVENOUS
  Filled 2022-09-01: qty 1

## 2022-09-01 MED ORDER — VALPROATE SODIUM 100 MG/ML IV SOLN
3000.0000 mg | Freq: Once | INTRAVENOUS | Status: AC
  Administered 2022-09-01: 3000 mg via INTRAVENOUS
  Filled 2022-09-01 (×2): qty 30

## 2022-09-01 MED ORDER — DIVALPROEX SODIUM 250 MG PO DR TAB: 750.0000 mg | DELAYED_RELEASE_TABLET | Freq: Two times a day (BID) | ORAL | Status: DC

## 2022-09-01 MED ORDER — KETOROLAC TROMETHAMINE 15 MG/ML IJ SOLN
15.0000 mg | Freq: Once | INTRAMUSCULAR | Status: AC
  Administered 2022-09-01: 15 mg via INTRAVENOUS
  Filled 2022-09-01: qty 1

## 2022-09-01 MED ORDER — LEVETIRACETAM IN NACL 1500 MG/100ML IV SOLN
1500.0000 mg | Freq: Two times a day (BID) | INTRAVENOUS | Status: DC
  Filled 2022-09-01: qty 100

## 2022-09-01 MED ORDER — DIVALPROEX SODIUM 250 MG PO DR TAB: 750.0000 mg | DELAYED_RELEASE_TABLET | Freq: Two times a day (BID) | ORAL | 0 refills | Status: DC

## 2022-09-01 MED ORDER — ACETAMINOPHEN 500 MG PO TABS
1000.0000 mg | ORAL_TABLET | ORAL | Status: AC
  Administered 2022-09-01: 1000 mg via ORAL
  Filled 2022-09-01: qty 2

## 2022-09-01 NOTE — ED Triage Notes (Signed)
Per EMS, patient was at neuro appt and had a 9 minute seizure. Patient does have hx of. Patient is currently alert and oriented to self and place

## 2022-09-01 NOTE — Discharge Instructions (Addendum)
Today you were seen in the emergency department for your seizures.    In the emergency department you had lab work, chest x-ray, and head CT that were reassuring.  You were started on Depakote after talking to your neurologist who would like for you to continue this at home along with your lacosamide.    Follow-up with your primary doctor in 2-3 days regarding your visit.  Follow-up with your neurologist as soon as possible for an appointment.  Return immediately to the emergency department if you experience any of the following: Recurrent seizures, fevers, or any other concerning symptoms.    Thank you for visiting our Emergency Department. It was a pleasure taking care of you today.

## 2022-09-01 NOTE — Therapy (Signed)
[facility] [address] [place], [place], [address] Phone: [phone]   Fax:  [phone]  Patient Details - ARRIVED NO CHARGE Name: Mario Davis MRN: [id number] Date of Birth: 1996/08/30 Referring Provider:  Maximiano Coss, NP  Encounter Date: 09/01/2022  Pt met at checkout of clinic with wife.  Pt greeted and immediately went into seizure.  PT remained w/ pt until EMS arrived for handoff, vitals assessed prior to handoff (BP 119/48 (61) and HR of 120bpm).  Seizure lasting approximately 9 minutes.  Bary Richard, PT, DPT 09/01/2022, 10:46 PM  [facility] [address] [place], [place], [address] Phone: [phone]   Fax:  [phone]

## 2022-09-01 NOTE — ED Provider Notes (Signed)
[facility] Provider Note   CSN: [id number] Arrival date & time: 09/01/22  1559     History  Chief Complaint  Patient presents with   Seizures    Mario Davis is a 26 y.o. male.  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  Patient states that he was in his usual state of health until the past week when he started developing a runny nose, sore throat, and cough.  Says that today he had 2 seizures which is abnormal for him.  Had his first at noon that lasted several minutes and then went to the neurology clinic and had a second seizure that lasted approximately 9 minutes.  Per his girlfriend they are generalized tonic-clonic.  Did have tongue bite.  No head trauma since.  Says that he has had a mild headache since then and then has chronic neck pain but it is unchanged.  Currently is back to his baseline.  Reports that he has been taking his lacosamide every day and did have morning dose today as well.   Past Medical History:  Diagnosis Date   Seizures ([place])        Home Medications Prior to Admission medications   Medication Sig Start Date End Date Taking? Authorizing Provider  divalproex (DEPAKOTE) 250 MG DR tablet Take 3 tablets (750 mg total) by mouth 2 (two) times daily. 09/01/22 10/01/22 Yes Fransico Meadow, MD  cyclobenzaprine (FLEXERIL) 10 MG tablet Take 1 tablet (10 mg total) by mouth as needed for up to 10 doses for muscle spasms. Patient not taking: Reported on 07/13/2022 06/02/22   Alric Ran, MD  ibuprofen (ADVIL) 200 MG tablet Take 200 mg by mouth every 6 (six) hours as needed for moderate pain.    [provider]  lacosamide (VIMPAT) 200 MG TABS tablet Take 1 tablet (200 mg total) by mouth 2 (two) times daily. 06/22/22 01/18/23  Alric Ran, MD      Allergies    Patient has no known allergies.    Review of  Systems   Review of Systems  Physical Exam Updated Vital Signs BP 107/69   Pulse (!) 101   Temp 97.9 F (36.6 C) (Oral)   Resp 18   Ht 6\' 1"  (1.854 m)   Wt 83.9 kg   SpO2 98%   BMI 24.41 kg/m  Physical Exam Vitals and nursing note reviewed.  Constitutional:      General: He is not in acute distress.    Appearance: He is well-developed.  HENT:     Head: Normocephalic and atraumatic.     Right Ear: External ear normal.     Left Ear: External ear normal.     Nose: Nose normal.  Eyes:     Extraocular Movements: Extraocular movements intact.     Conjunctiva/sclera: Conjunctivae normal.     Pupils: Pupils are equal, round, and reactive to light.     Comments: Pupils 4 mm bilaterally  Cardiovascular:     Rate and Rhythm: Normal rate and regular rhythm.     Heart sounds: Normal heart sounds.  Pulmonary:     Effort: Pulmonary effort is normal. No respiratory distress.     Breath sounds: Normal breath sounds.  Abdominal:     Palpations: Abdomen is soft.  Musculoskeletal:        General: No swelling.     Cervical back: Normal  range of motion and neck supple.     Right lower leg: No edema.     Left lower leg: No edema.  Skin:    General: Skin is warm and dry.     Capillary Refill: Capillary refill takes less than 2 seconds.  Neurological:     Mental Status: He is alert. Mental status is at baseline.     Comments: MENTAL STATUS: AAOx3 CRANIAL NERVES: II: Pupils equal and reactive 4 mm BL, no RAPD III, IV, VI: EOM intact, no gaze preference or deviation, no nystagmus. V: normal sensation to light touch in V1, V2, and V3 segments bilaterally VII: no facial weakness or asymmetry, no nasolabial fold flattening VIII: normal hearing to speech and finger friction IX, X: normal palatal elevation, no uvular deviation XI: 5/5 head turn and 5/5 shoulder shrug bilaterally XII: midline tongue protrusion MOTOR: 5/5 strength in R shoulder flexion, elbow flexion and extension, and grip  strength. 5/5 strength in L shoulder flexion, elbow flexion and extension, and grip strength.  5/5 strength in R hip and knee flexion, knee extension, ankle plantar and dorsiflexion. 5/5 strength in L hip and knee flexion, knee extension, ankle plantar and dorsiflexion. SENSORY: Normal sensation to light touch in all extremities COORD: Normal finger to nose and heel to shin, no tremor, no dysmetria STATION: no truncal ataxia   Psychiatric:        Mood and Affect: Mood normal.        Behavior: Behavior normal.     ED Results / Procedures / Treatments   Labs (all labs ordered are listed, but only abnormal results are displayed) Labs Reviewed  COMPREHENSIVE METABOLIC PANEL - Abnormal; Notable for the following components:      Result Value   CO2 18 (*)    Glucose, Bld 109 (*)    All other components within normal limits  MAGNESIUM - Abnormal; Notable for the following components:   Magnesium 2.5 (*)    All other components within normal limits  CBC - Abnormal; Notable for the following components:   WBC 15.5 (*)    All other components within normal limits  CBG MONITORING, ED - Abnormal; Notable for the following components:   Glucose-Capillary 103 (*)    All other components within normal limits  RESP PANEL BY RT-PCR (FLU A&B, COVID) ARPGX2  CBC WITH DIFFERENTIAL/PLATELET  LACOSAMIDE    EKG EKG Interpretation  Date/Time:  Wednesday September 01 2022 19:11:15 EDT Ventricular Rate:  99 PR Interval:  170 QRS Duration: 90 QT Interval:  336 QTC Calculation: 431 R Axis:   69 Text Interpretation: Normal sinus rhythm Normal ECG When compared with ECG of 27-Feb-2022 13:47, left atrial enlargement no longer present Confirmed by Margaretmary Eddy [phone]) on 09/01/2022 7:38:11 PM  Radiology CT Head Wo Contrast  Result Date: 09/01/2022 CLINICAL DATA:  Seizure, new-onset, no history of trauma R sided headach EXAM: CT HEAD WITHOUT CONTRAST TECHNIQUE: Contiguous axial images were  obtained from the base of the skull through the vertex without intravenous contrast. RADIATION DOSE REDUCTION: This exam was performed according to the departmental dose-optimization program which includes automated exposure control, adjustment of the mA and/or kV according to patient size and/or use of iterative reconstruction technique. COMPARISON:  CT head February 27, 2022. FINDINGS: Brain: No evidence of acute infarction, hemorrhage, hydrocephalus, extra-axial collection or mass lesion/mass effect. Vascular: No hyperdense vessel identified. Skull: No acute fracture. Sinuses/Orbits: Mild inferior left maxillary sinus mucosal thickening. No acute orbital findings. Other: No mastoid  effusions. IMPRESSION: No evidence of acute intracranial abnormality. Electronically Signed   By: Margaretha Sheffield M.D.   On: 09/01/2022 19:47   DG Chest 2 View  Result Date: 09/01/2022 CLINICAL DATA:  Cough. Per EMS patient was at neuro appointment and head 9 minute seizures. EXAM: CHEST - 2 VIEW COMPARISON:  Chest radiograph dated May 27, 2021 FINDINGS: The heart size and mediastinal contours are within normal limits. Both lungs are clear. The visualized skeletal structures are unremarkable. IMPRESSION: No active cardiopulmonary disease. Electronically Signed   By: Keane Police D.O.   On: 09/01/2022 17:35    Procedures Procedures   Medications Ordered in ED Medications  divalproex (DEPAKOTE) DR tablet 750 mg (has no administration in time range)  acetaminophen (TYLENOL) tablet 1,000 mg (1,000 mg Oral Given 09/01/22 1729)  ketorolac (TORADOL) 15 MG/ML injection 15 mg (15 mg Intravenous Given 09/01/22 1729)  valproate (DEPACON) 3,000 mg in dextrose 5 % 50 mL IVPB (0 mg Intravenous Stopped 09/01/22 1833)  morphine (PF) 4 MG/ML injection 4 mg (4 mg Intravenous Given 09/01/22 1839)    ED Course/ Medical Decision Making/ A&P Clinical Course as of 09/01/22 2022  Wed Sep 01, 2022  1643 Talked to Dr April Manson from neurology  who recommends Depakote load and 750 twice daily at home in addition to his current lacosamide. [RP]  1829 Hemoglobin(!): 8.3 Decreased from baseline of 15. Pt states that he has not had any bleeding recently. Will repeat and check to see if it was a lab error. [RP]  1910 Hemoglobin: 14.6 [RP]    Clinical Course User Index [RP] Fransico Meadow, MD                           Medical Decision Making Amount and/or Complexity of Data Reviewed Labs: ordered. Decision-making details documented in ED Course. Radiology: ordered.  Risk OTC drugs. Prescription drug management.   RAHEIM KOSMATKA Davis is a 26 y.o. male with comorbidities that complicate the patient evaluation including seizures who presents with chief complaint of refractory seizures.  This patient presents to the ED for concern of complaints listed in HPI, this involves an extensive number of treatment options, and is a complaint that carries with it a high risk of complications and morbidity.   Initial Ddx:  Seizures, URI, pneumonia, ICH, CNS infection  MDM:  Feel the patient is likely having breakthrough seizures in the setting of a possible URI.  We will also obtain chest x-ray to evaluate for pneumonia but feel this is highly unlikely.  Appears that has been compliant with his medications.  Considered ICH and CNS infection but the patient has a nonfocal neurologic exam at this time and per he him and his girlfriend typically has a mild headache after his seizures.  Plan:  Labs Lacosamide level COVID/flu Chest x-ray Neurology consult  ED Summary:  Consulted the patient's outpatient neurologist who recommended starting him on Depakote with a load in the emergency department and 750 twice daily as an outpatient.  Labs initially showed a low hemoglobin but was repeated due to concerns for lab error which was WNL afterwards.  COVID and flu were negative.  Patient had chest x-ray that was negative.  Was complaining of  a persistent headache so CT of the head was obtained because he states that this is worse than usual which did not reveal any acute abnormality.  Patient's symptoms improved with morphine, Tylenol, and Toradol.  Discharged the  patient with a prescription for Depakote and told him to take this as well as his lacosamide and follow-up with his neurologist for further management.  Dispo: DC Home. Return precautions discussed including, but not limited to, those listed in the AVS. Allowed pt time to ask questions which were answered fully prior to dc.   Additional history obtained from significant other Records reviewed Care Everywhere The following labs were independently interpreted: Chemistry and show non-anion gap metabolic acidosis I independently reviewed the following imaging with scope of interpretation limited to determining acute life threatening conditions related to emergency care: Chest x-ray and CT Head, which revealed no acute abnormality  I personally reviewed and interpreted the pt's EKG: see above for interpretation  I have reviewed the patients home medications and made adjustments as needed Consults: Neurology  Final Clinical Impression(s) / ED Diagnoses Final diagnoses:  Seizure ([place])    Rx / DC Orders ED Discharge Orders          Ordered    divalproex (DEPAKOTE) 250 MG DR tablet  2 times daily        09/01/22 1956              Fransico Meadow, MD 09/01/22 2022

## 2022-09-03 ENCOUNTER — Telehealth: Payer: Self-pay | Admitting: Physical Therapy

## 2022-09-03 LAB — LACOSAMIDE: Lacosamide: 0.7 ug/mL — ABNORMAL LOW (ref 5.0–10.0)

## 2022-09-03 NOTE — Telephone Encounter (Signed)
LVM for patient to return call from clinic, PT following up on status of patient from Wednesday.  Elease Etienne, PT, DPT

## 2022-09-08 ENCOUNTER — Ambulatory Visit: Payer: Medicaid Other | Admitting: Physical Therapy

## 2022-09-15 ENCOUNTER — Encounter: Payer: Self-pay | Admitting: Neurology

## 2022-09-15 ENCOUNTER — Ambulatory Visit: Payer: Medicaid Other | Attending: Neurology | Admitting: Physical Therapy

## 2022-09-15 ENCOUNTER — Ambulatory Visit: Payer: Medicaid Other | Admitting: Neurology

## 2022-09-22 ENCOUNTER — Ambulatory Visit: Payer: Medicaid Other | Admitting: Physical Therapy

## 2022-09-30 ENCOUNTER — Telehealth: Payer: Self-pay | Admitting: Neurology

## 2022-09-30 NOTE — Telephone Encounter (Signed)
LVM and sent MyChart msg informing pt of r/s needed for 11/20 appt- MD out.

## 2022-09-30 NOTE — Progress Notes (Signed)
Lacosamide level ordered to assess for compliance to medications.

## 2022-10-04 ENCOUNTER — Ambulatory Visit: Payer: Medicaid Other | Admitting: Neurology

## 2022-10-18 ENCOUNTER — Encounter: Payer: Self-pay | Admitting: Neurology

## 2022-10-19 ENCOUNTER — Other Ambulatory Visit: Payer: Self-pay | Admitting: Neurology

## 2022-10-19 MED ORDER — DIVALPROEX SODIUM 250 MG PO DR TAB: 750.0000 mg | DELAYED_RELEASE_TABLET | Freq: Two times a day (BID) | ORAL | 0 refills | Status: DC

## 2022-10-19 NOTE — Telephone Encounter (Signed)
We can give him a 1 month supply and schedule for a follow up. Thanks. He needs to follow up

## 2022-10-20 NOTE — Telephone Encounter (Signed)
Pt scheduled for next available appointment with Dr. Teresa Coombs and added to the wait list

## 2022-11-25 ENCOUNTER — Ambulatory Visit: Payer: Medicaid Other | Admitting: Neurology

## 2022-12-05 ENCOUNTER — Other Ambulatory Visit: Payer: Self-pay | Admitting: Neurology

## 2022-12-06 ENCOUNTER — Other Ambulatory Visit: Payer: Self-pay | Admitting: Neurology

## 2022-12-06 MED ORDER — DIVALPROEX SODIUM 250 MG PO DR TAB: 750.0000 mg | DELAYED_RELEASE_TABLET | Freq: Two times a day (BID) | ORAL | 0 refills | Status: DC

## 2022-12-07 ENCOUNTER — Other Ambulatory Visit: Payer: Self-pay | Admitting: Neurology

## 2022-12-07 ENCOUNTER — Encounter: Payer: Self-pay | Admitting: Neurology

## 2022-12-14 ENCOUNTER — Encounter: Payer: Self-pay | Admitting: Neurology

## 2023-01-04 ENCOUNTER — Encounter: Payer: Self-pay | Admitting: Neurology

## 2023-01-04 ENCOUNTER — Ambulatory Visit: Payer: Medicaid Other | Admitting: Neurology

## 2023-01-04 VITALS — BP 119/78 | HR 99 | Ht 73.0 in | Wt 199.5 lb

## 2023-01-04 DIAGNOSIS — G40919 Epilepsy, unspecified, intractable, without status epilepticus: Secondary | ICD-10-CM | POA: Diagnosis not present

## 2023-01-04 DIAGNOSIS — Z5181 Encounter for therapeutic drug level monitoring: Secondary | ICD-10-CM | POA: Diagnosis not present

## 2023-01-04 MED ORDER — DIVALPROEX SODIUM 500 MG PO DR TAB: 1000.0000 mg | DELAYED_RELEASE_TABLET | Freq: Two times a day (BID) | ORAL | 6 refills | Status: DC

## 2023-01-04 MED ORDER — CLONAZEPAM 1 MG PO TABS: 1.0000 mg | ORAL_TABLET | Freq: Two times a day (BID) | ORAL | 0 refills | Status: DC

## 2023-01-04 NOTE — Progress Notes (Signed)
[facility]  PATIENT: Mario Davis [id number] DOB: 06/25/1996  REFERRING CLINICIAN: Maximiano Coss, NP HISTORY FROM: Patient and [name]  REASON FOR VISIT: New onset seizure   HISTORICAL  CHIEF COMPLAINT:  Chief Complaint  Patient presents with   Follow-up    Rm 12. Patient with SO. Patient reports three seizures since last visit, patient reports taking all prescribed medication. Patient reports feeling panicky at this time, states this usually occurs before a seizure occurs. Patient also reports he thinks he may be having more seizures in his sleep because he will wake up with altered vision and jaw pain.    INTERVAL HISTORY 01/04/2023:  Patient presents today for follow-up, he is accompanied by her significant other.  He is following up for his seizure.  He continued to have breakthrough seizures despite being on Vimpat 200 mg twice daily and Depakote 750 mg twice daily.  He reports compliance with his medicine but with each of his breakthrough seizure his antiseizure levels are undetectable.  Again patient is adamant that he is taking his medications as prescribed, he has not miss any dose.  He also has missed few follow-up with me.  He feels he is having seizures during his sleep because he will wake up in the morning with sore jaw and tiredness. Girlfriend also reported that at night he will shake during his sleep and become unresponsive. He is also complaining of worsening anxiety.  So far he has tried Levetiracetam, Lamotrigine, Lacosamide and Valproic acid    INTERVAL HISTORY 06/02/2022 Patient presents today for follow-up, last visit was in January.  At that time he was doing well on Lamotrigine 100 mg BID.  Unfortunately in April, he did have a breakthrough seizure.  At that time his lamotrigine level was 1.7.  Patient reports compliance with the medication.  I increase the lamotrigine to 150 mg twice daily.  Since then he has been having worsening  headache, complaint of numbness and tingling in the mainly right arm and right leg but no seizures.   INTERVAL HISTORY 11/19/2021:  Patient presents today for follow-up, last visit was on October 3.  At that time plan was to increase Lamictal 200 mg twice daily.  He reported compliance with medication, denies any seizures since last visit, denies any side effect from the medication.  No other concerns or complaints.  Interval History 08/17/2021:  Patient presented for follow-up, last visit was July 28, at that time he was started on lamotrigine 25 mg with plan to uptitrate until 100 mg twice a day.  For some reason, patient is currently taking 100 mg in the morning and 25 mg at night.  He said that he was not aware that his evening dose was supposed to be 100 mg.  He denies any seizures since last visit, denies any new rash but states that lamotrigine is keeping him up at night.  He takes his evening dose around 8 PM.  No other complaints.  He is currently not working.   HISTORY OF PRESENT ILLNESS:  This is a 27 year old man with no past medical history who is presenting with new onset seizure.  Patient reported 2 weeks ago he had his first lifetime seizure.  Per girlfriend who accompanied patient today she reported patient was asleep and she noted that he was staring at the ceiling, then followed by left face twitching followed by generalized body convulsions.  The entire episode lasted about 2 to 3-minute, after which he was very  confused and combative.  He did not remember where he was, he wanted to leave the house and it took him about 30 minutes to regain consciousness.  He was taken to a local ED and admitted for additional work-up.  Initial lab work-up was unrevealing.  He did have a normal MRI of the brain.  He also have a routine EEG which was normal.  He was started on Keppra and discharged home.  Patient stated since starting the [place], he has been having headaches and pain behind eyes and he  noted also that blood in the stool.  He also reported he was having some mood changes, he became angry for no reason.  He has discontinued the Keppra 4 days ago. He has a family history of seizures mother and uncle had seizures.  He denies any previous history of head trauma no other seizure risk factors noted. Since being discharged only ED he has not had any additional seizure.  He is currently not driving.  She understand that he is not supposed to drive for at least 6 months.  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  He continues to play but has not had any additional incident  Currently he denies any headaches no change in vision no abdominal pain no other complaint.   One additional issue that he brought up is chest pain 4 months ago, described as could not breathe, happened every other night, seen his PMD, had a EKG and told that everything is fine.   REVIEW OF SYSTEMS: Full 14 system review of systems performed and negative with exception of: except as noted in the HPI  ALLERGIES: No Known Allergies  HOME MEDICATIONS: Outpatient Medications Prior to Visit  Medication Sig Dispense Refill   ibuprofen (ADVIL) 200 MG tablet Take 200 mg by mouth every 6 (six) hours as needed for moderate pain.     lacosamide (VIMPAT) 200 MG TABS tablet Take 1 tablet (200 mg total) by mouth 2 (two) times daily. 60 tablet 6   divalproex (DEPAKOTE) 250 MG DR tablet Take 3 tablets (750 mg total) by mouth 2 (two) times daily. 180 tablet 0   cyclobenzaprine (FLEXERIL) 10 MG tablet Take 1 tablet (10 mg total) by mouth as needed for up to 10 doses for muscle spasms. (Patient not taking: Reported on 01/04/2023) 10 tablet 0   No facility-administered medications prior to visit.    PAST MEDICAL  HISTORY: Past Medical History:  Diagnosis Date   Seizures (Between)     PAST SURGICAL HISTORY: History reviewed. No pertinent surgical history.  FAMILY HISTORY: Family History  Problem Relation Age of Onset   Seizures Mother    Hypertension Father     SOCIAL HISTORY: Social History   Socioeconomic History   Marital status: Significant Other    Spouse name: Not on file   Number of children: 0   Years of education: Not on file   Highest education level: Not on file  Occupational History   Not on file  Tobacco Use   Smoking status: Never   Smokeless tobacco: Never  Vaping Use   Vaping Use: Never used  Substance and Sexual Activity   Alcohol use: Yes   Drug use: No   Sexual activity: Not  on file  Other Topics Concern   Not on file  Social History Narrative   Lives with girlfriend [name]   Right Handed   Drinks 6-8 cups caffeine daily   Social Determinants of Health   Financial Resource Strain: Not on file  Food Insecurity: Not on file  Transportation Needs: Not on file  Physical Activity: Not on file  Stress: Not on file  Social Connections: Not on file  Intimate Partner Violence: Not on file     PHYSICAL EXAM  GENERAL EXAM/CONSTITUTIONAL: Vitals:  Vitals:   01/04/23 1110  BP: 119/78  Pulse: 99  Weight: 199 lb 8 oz (90.5 kg)  Height: 6' 1"$  (1.854 m)    Body mass index is 26.32 kg/m. Wt Readings from Last 3 Encounters:  01/04/23 199 lb 8 oz (90.5 kg)  09/01/22 185 lb (83.9 kg)  07/13/22 184 lb (83.5 kg)   Patient is in no distress; well developed, nourished and groomed; neck is supple   NEUROLOGIC: MENTAL STATUS:      No data to display         awake, alert, oriented to person, place and time recent and remote memory intact normal attention and concentration language fluent, comprehension intact, naming intact fund of knowledge appropriate  CRANIAL NERVE:  2nd, 3rd, 4th, 6th -visual fields full to confrontation, extraocular muscles  intact, no nystagmus 5th - facial sensation symmetric 7th - facial strength symmetric 8th - hearing intact 9th - palate elevates symmetrically, uvula midline 11th - shoulder shrug symmetric 12th - tongue protrusion midline   GAIT/STATION:  normal   DIAGNOSTIC DATA (LABS, IMAGING, TESTING) - I reviewed patient records, labs, notes, testing and imaging myself where available.  Lab Results  Component Value Date   WBC 15.5 (H) 09/01/2022   HGB 14.6 09/01/2022   HCT 41.0 09/01/2022   MCV 85.8 09/01/2022   PLT 308 09/01/2022      Component Value Date/Time   NA 138 09/01/2022 1655   NA 137 08/17/2021 1115   K 3.5 09/01/2022 1655   CL 107 09/01/2022 1655   CO2 18 (L) 09/01/2022 1655   GLUCOSE 109 (H) 09/01/2022 1655   BUN 11 09/01/2022 1655   BUN 15 08/17/2021 1115   CREATININE 1.12 09/01/2022 1655   CALCIUM 9.2 09/01/2022 1655   PROT 7.3 09/01/2022 1655   ALBUMIN 3.9 09/01/2022 1655   AST 30 09/01/2022 1655   ALT 18 09/01/2022 1655   ALKPHOS 82 09/01/2022 1655   BILITOT 0.7 09/01/2022 1655   GFRNONAA >60 09/01/2022 1655   GFRAA >60 11/21/2017 2155   No results found for: "CHOL", "HDL", "LDLCALC", "LDLDIRECT", "TRIG", "CHOLHDL" No results found for: "HGBA1C" Lab Results  Component Value Date   VITAMINB12 338 07/13/2022   Lab Results  Component Value Date   TSH 0.368 05/27/2021    MRI Brain: Reviewed, within normal limits   Routine EEG: Normal routine EEG    ASSESSMENT AND PLAN  27 y.o. year old male here for follow-up for his seizures and headaches.  He continues to have seizures despite being on Vimpat 200 mg twice daily and Depakote 750 mg twice daily.  In the past his antiseizure medication level were undetectable but patient is adamant that he is taking his medications as prescribed.  Plan will be to check his antiseizure medication level, increase the Depakote to 1000 mg twice daily and refer him to EMU for characterization and presurgical planning.  So  far he has tried 4 antiseizure medications,  after EMU admission, plan will likely be VNS if he continues to have breakthrough seizure.  This was explained to patient and fianc and they are comfortable with plan. I will also give him a Klonopin bridge to help him with his anxiety. I will see him in 3 months for follow-up.     1. Intractable epilepsy without status epilepticus, unspecified epilepsy type ([place])   2. Therapeutic drug monitoring      Patient Instructions  Continue with Vimpat 200 mg twice daily Increase Depakote to 1000 mg twice daily Will check Vimpat level and Depakote level with CMP Klonopin bridge to help with the anxiety  EMU admission for presurgical workup Follow-up in 3 months.  --------------------------------------------   Per [facility] statutes, patients with seizures are not allowed to drive until they have been seizure-free for six months.  Other recommendations include using caution when using heavy equipment or power tools. Avoid working on ladders or at heights. Take showers instead of baths.  Do not swim alone.  Ensure the water temperature is not too high on the home water heater. Do not go swimming alone. Do not lock yourself in a room alone (i.e. bathroom). When caring for infants or small children, sit down when holding, feeding, or changing them to minimize risk of injury to the child in the event you have a seizure. Maintain good sleep hygiene. Avoid alcohol.  Also recommend adequate sleep, hydration, good diet and minimize stress.   During the Seizure  - First, ensure adequate ventilation and place patients on the floor on their left side  Loosen clothing around the neck and ensure the airway is patent. If the patient is clenching the teeth, do not force the mouth open with any object as this can cause severe damage - Remove all items from the surrounding that can be hazardous. The patient may be oblivious to what's happening and may not even  know what he or she is doing. If the patient is confused and wandering, either gently guide him/her away and block access to outside areas - Reassure the individual and be comforting - Call 911. In most cases, the seizure ends before EMS arrives. However, there are cases when seizures may last over 3 to 5 minutes. Or the individual may have developed breathing difficulties or severe injuries. If a pregnant patient or a person with diabetes develops a seizure, it is prudent to call an ambulance. - Finally, if the patient does not regain full consciousness, then call EMS. Most patients will remain confused for about 45 to 90 minutes after a seizure, so you must use judgment in calling for help. - Avoid restraints but make sure the patient is in a bed with padded side rails - Place the individual in a lateral position with the neck slightly flexed; this will help the saliva drain from the mouth and prevent the tongue from falling backward - Remove all nearby furniture and other hazards from the area - Provide verbal assurance as the individual is regaining consciousness - Provide the patient with privacy if possible - Call for help and start treatment as ordered by the caregiver   After the Seizure (Postictal Stage)  After a seizure, most patients experience confusion, fatigue, muscle pain and/or a headache. Thus, one should permit the individual to sleep. For the next few days, reassurance is essential. Being calm and helping reorient the person is also of importance.  Most seizures are painless and end spontaneously. Seizures are not harmful to  others but can lead to complications such as stress on the lungs, brain and the heart. Individuals with prior lung problems may develop labored breathing and respiratory distress.     Orders Placed This Encounter  Procedures   Valproic Acid Level   Lacosamide   CMP     Meds ordered this encounter  Medications   divalproex (DEPAKOTE) 500 MG DR  tablet    Sig: Take 2 tablets (1,000 mg total) by mouth 2 (two) times daily.    Dispense:  120 tablet    Refill:  6   clonazePAM (KLONOPIN) 1 MG tablet    Sig: Take 1 tablet (1 mg total) by mouth 2 (two) times daily.    Dispense:  60 tablet    Refill:  0     Return in about 3 months (around 04/04/2023).     Alric Ran, MD 01/04/2023, 3:29 PM  [facility] [address] [place], [place] [address] [phone]

## 2023-01-04 NOTE — Patient Instructions (Addendum)
Continue with Vimpat 200 mg twice daily Increase Depakote to 1000 mg twice daily Will check Vimpat level and Depakote level with CMP Klonopin bridge to help with the anxiety  EMU admission for presurgical workup Follow-up in 3 months.

## 2023-01-05 ENCOUNTER — Telehealth: Payer: Self-pay | Admitting: Neurology

## 2023-01-05 NOTE — Telephone Encounter (Signed)
Referral for Neurology fax to [facility]. Phone: [phone], Fax: [phone]

## 2023-01-07 LAB — COMPREHENSIVE METABOLIC PANEL
ALT: 21 IU/L (ref 0–44)
AST: 17 IU/L (ref 0–40)
Albumin/Globulin Ratio: 1.5 (ref 1.2–2.2)
Albumin: 4.5 g/dL (ref 4.3–5.2)
Alkaline Phosphatase: 92 IU/L (ref 44–121)
BUN/Creatinine Ratio: 17 (ref 9–20)
BUN: 16 mg/dL (ref 6–20)
Bilirubin Total: 0.2 mg/dL (ref 0.0–1.2)
CO2: 23 mmol/L (ref 20–29)
Calcium: 9.5 mg/dL (ref 8.7–10.2)
Chloride: 102 mmol/L (ref 96–106)
Creatinine, Ser: 0.93 mg/dL (ref 0.76–1.27)
Globulin, Total: 3 g/dL (ref 1.5–4.5)
Glucose: 109 mg/dL — ABNORMAL HIGH (ref 70–99)
Potassium: 4.6 mmol/L (ref 3.5–5.2)
Sodium: 139 mmol/L (ref 134–144)
Total Protein: 7.5 g/dL (ref 6.0–8.5)
eGFR: 118 mL/min/{1.73_m2} (ref >59)

## 2023-01-07 LAB — VALPROIC ACID LEVEL: Valproic Acid Lvl: 79 ug/mL (ref 50–100)

## 2023-01-07 LAB — LACOSAMIDE: Lacosamide: 9.4 ug/mL (ref 5.0–10.0)

## 2023-01-18 ENCOUNTER — Other Ambulatory Visit: Payer: Self-pay | Admitting: Neurology

## 2023-01-24 ENCOUNTER — Other Ambulatory Visit: Payer: Self-pay | Admitting: Neurology

## 2023-01-25 MED ORDER — LACOSAMIDE 200 MG PO TABS: 200.0000 mg | ORAL_TABLET | Freq: Two times a day (BID) | ORAL | 6 refills | Status: DC

## 2023-02-07 ENCOUNTER — Encounter: Payer: Self-pay | Admitting: Neurology

## 2023-02-07 NOTE — Telephone Encounter (Signed)
Please advise patient to increase the Depakote to 1500 mg twice daily. We will also request a ambulatory EEG.

## 2023-02-09 ENCOUNTER — Telehealth: Payer: Self-pay

## 2023-02-09 NOTE — Telephone Encounter (Signed)
Eeg orders faxed

## 2023-02-14 ENCOUNTER — Inpatient Hospital Stay (HOSPITAL_COMMUNITY)
Admission: RE | Admit: 2023-02-14 | Discharge: 2023-02-17 | DRG: 101 | Disposition: A | Discharge status: Home / Self Care | Payer: Medicaid Other | Source: Ambulatory Visit | Attending: Neurology | Admitting: Neurology

## 2023-02-14 ENCOUNTER — Encounter (HOSPITAL_COMMUNITY): Payer: Self-pay | Admitting: Neurology

## 2023-02-14 ENCOUNTER — Other Ambulatory Visit: Payer: Self-pay

## 2023-02-14 ENCOUNTER — Inpatient Hospital Stay (HOSPITAL_COMMUNITY): Payer: Medicaid Other

## 2023-02-14 DIAGNOSIS — F419 Anxiety disorder, unspecified: Secondary | ICD-10-CM | POA: Diagnosis present

## 2023-02-14 DIAGNOSIS — Z8249 Family history of ischemic heart disease and other diseases of the circulatory system: Secondary | ICD-10-CM

## 2023-02-14 DIAGNOSIS — R569 Unspecified convulsions: Secondary | ICD-10-CM

## 2023-02-14 DIAGNOSIS — R739 Hyperglycemia, unspecified: Secondary | ICD-10-CM | POA: Diagnosis present

## 2023-02-14 DIAGNOSIS — E876 Hypokalemia: Secondary | ICD-10-CM | POA: Diagnosis present

## 2023-02-14 DIAGNOSIS — G40109 Localization-related (focal) (partial) symptomatic epilepsy and epileptic syndromes with simple partial seizures, not intractable, without status epilepticus: Secondary | ICD-10-CM | POA: Diagnosis present

## 2023-02-14 DIAGNOSIS — R519 Headache, unspecified: Secondary | ICD-10-CM | POA: Diagnosis not present

## 2023-02-14 DIAGNOSIS — Z79899 Other long term (current) drug therapy: Secondary | ICD-10-CM | POA: Diagnosis not present

## 2023-02-14 LAB — CBC WITH DIFFERENTIAL/PLATELET
Abs Immature Granulocytes: 0 10*3/uL (ref 0.00–0.07)
Basophils Absolute: 0 10*3/uL (ref 0.0–0.1)
Basophils Relative: 0 %
Eosinophils Absolute: 0.1 10*3/uL (ref 0.0–0.5)
Eosinophils Relative: 1 %
HCT: 40.6 % (ref 39.0–52.0)
Hemoglobin: 14.4 g/dL (ref 13.0–17.0)
Immature Granulocytes: 0 %
Lymphocytes Relative: 42 %
Lymphs Abs: 2.5 10*3/uL (ref 0.7–4.0)
MCH: 31.7 pg (ref 26.0–34.0)
MCHC: 35.5 g/dL (ref 30.0–36.0)
MCV: 89.4 fL (ref 80.0–100.0)
Monocytes Absolute: 0.7 10*3/uL (ref 0.1–1.0)
Monocytes Relative: 11 %
Neutro Abs: 2.8 10*3/uL (ref 1.7–7.7)
Neutrophils Relative %: 46 %
Platelets: 280 10*3/uL (ref 150–400)
RBC: 4.54 MIL/uL (ref 4.22–5.81)
RDW: 12.5 % (ref 11.5–15.5)
WBC: 6.1 10*3/uL (ref 4.0–10.5)
nRBC: 0 % (ref 0.0–0.2)

## 2023-02-14 LAB — COMPREHENSIVE METABOLIC PANEL
ALT: 28 U/L (ref 0–44)
AST: 29 U/L (ref 15–41)
Albumin: 3.7 g/dL (ref 3.5–5.0)
Alkaline Phosphatase: 68 U/L (ref 38–126)
Anion gap: 9 (ref 5–15)
BUN: 16 mg/dL (ref 6–20)
CO2: 24 mmol/L (ref 22–32)
Calcium: 8.8 mg/dL — ABNORMAL LOW (ref 8.9–10.3)
Chloride: 105 mmol/L (ref 98–111)
Creatinine, Ser: 1.15 mg/dL (ref 0.61–1.24)
GFR, Estimated: >60 mL/min (ref >60)
Glucose, Bld: 129 mg/dL — ABNORMAL HIGH (ref 70–99)
Potassium: 3.1 mmol/L — ABNORMAL LOW (ref 3.5–5.1)
Sodium: 138 mmol/L (ref 135–145)
Total Bilirubin: 0.9 mg/dL (ref 0.3–1.2)
Total Protein: 6.9 g/dL (ref 6.5–8.1)

## 2023-02-14 LAB — PROTIME-INR
INR: 1 (ref 0.8–1.2)
Prothrombin Time: 13.4 seconds (ref 11.4–15.2)

## 2023-02-14 LAB — MAGNESIUM: Magnesium: 1.8 mg/dL (ref 1.7–2.4)

## 2023-02-14 LAB — VALPROIC ACID LEVEL: Valproic Acid Lvl: 10 ug/mL — ABNORMAL LOW (ref 50.0–100.0)

## 2023-02-14 LAB — PHOSPHORUS: Phosphorus: 2.8 mg/dL (ref 2.5–4.6)

## 2023-02-14 MED ORDER — CLONAZEPAM 0.5 MG PO TBDP: 1.0000 mg | ORAL_TABLET | Freq: Two times a day (BID) | ORAL | Status: DC | PRN

## 2023-02-14 MED ORDER — ACETAMINOPHEN 325 MG PO TABS
650.0000 mg | ORAL_TABLET | ORAL | Status: DC | PRN
  Administered 2023-02-14 – 2023-02-16 (×2): 650 mg via ORAL
  Filled 2023-02-14 (×2): qty 2

## 2023-02-14 MED ORDER — LABETALOL HCL 5 MG/ML IV SOLN: 5.0000 mg | INTRAVENOUS | Status: DC | PRN

## 2023-02-14 MED ORDER — SODIUM CHLORIDE 0.9% FLUSH
3.0000 mL | Freq: Two times a day (BID) | INTRAVENOUS | Status: DC
  Administered 2023-02-14 – 2023-02-17 (×7): 3 mL via INTRAVENOUS

## 2023-02-14 MED ORDER — ACETAMINOPHEN 650 MG RE SUPP: 650.0000 mg | RECTAL | Status: DC | PRN

## 2023-02-14 MED ORDER — ENOXAPARIN SODIUM 40 MG/0.4ML IJ SOSY
40.0000 mg | PREFILLED_SYRINGE | INTRAMUSCULAR | Status: DC
  Filled 2023-02-14: qty 0.4

## 2023-02-14 MED ORDER — MIDAZOLAM HCL 2 MG/2ML IJ SOLN
2.0000 mg | INTRAMUSCULAR | Status: DC | PRN
  Administered 2023-02-16: 2 mg via INTRAVENOUS
  Filled 2023-02-14: qty 2

## 2023-02-14 NOTE — Progress Notes (Signed)
Patient arrived to unit, VSS, fall mat in place, bed in low position, and bed alarm activated. Patient educated on safety precautions and call bell left within reach.  Gwendolyn Grant, RN
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
## 2023-02-14 NOTE — Progress Notes (Signed)
EMU Hookup complete - no initial skin breakdown.  Sitter duties explained. [facility] notified and monitoring.  Push button tested with [facility].  

## 2023-02-15 DIAGNOSIS — R569 Unspecified convulsions: Secondary | ICD-10-CM | POA: Diagnosis not present

## 2023-02-15 LAB — RAPID URINE DRUG SCREEN, HOSP PERFORMED
Amphetamines: NOT DETECTED
Barbiturates: NOT DETECTED
Benzodiazepines: NOT DETECTED
Cocaine: NOT DETECTED
Opiates: NOT DETECTED
Tetrahydrocannabinol: NOT DETECTED

## 2023-02-15 MED ORDER — POTASSIUM CHLORIDE 20 MEQ PO PACK
40.0000 meq | PACK | Freq: Once | ORAL | Status: AC
  Administered 2023-02-15: 40 meq via ORAL
  Filled 2023-02-15: qty 2

## 2023-02-15 MED ORDER — DIPHENHYDRAMINE-ZINC ACETATE 2-0.1 % EX CREA
TOPICAL_CREAM | Freq: Every day | CUTANEOUS | Status: DC | PRN
  Filled 2023-02-15: qty 28

## 2023-02-15 MED ORDER — HYDROCORTISONE 0.5 % EX CREA
TOPICAL_CREAM | Freq: Two times a day (BID) | CUTANEOUS | Status: AC
  Administered 2023-02-15: 1 via TOPICAL
  Filled 2023-02-15: qty 28.35

## 2023-02-15 NOTE — Progress Notes (Addendum)
Called to evaluate small area of rash to right forearm near venipuncture site. A few light red nonblanching raised macules are seen just anterior to the antecubital fossa. The intervening skin shows no inflammatory changes.   Hydrocortisone cream has been ordered for probable focal allergic reaction x 2 doses. Also have ordered PRN Benadryl cream.   Electronically signed: Dr. Kerney Elbe

## 2023-02-15 NOTE — Progress Notes (Signed)
Patient called nurse to the room to express that he had an "anxiety attack". Patient reported SOB and chest pain that subsided before the nurse presented to room. Hortense Ramal, MD made aware.  Gwendolyn Grant, RN

## 2023-02-15 NOTE — Progress Notes (Signed)
  Transition of Care [facility]) Screening Note   Patient Details  Name: Mario Davis Date of Birth: 1996/05/03   [facility] of Care [facility]) CM/SW Contact:    Pollie Friar, RN Phone Number: 02/15/2023, 10:34 AM   Pt is from home with spouse. Admitted to EMU.  Transition of Care Department [facility]) has reviewed patient. We will continue to monitor patient advancement through interdisciplinary progression rounds. If new patient transition needs arise, please place a TOC consult.

## 2023-02-15 NOTE — Procedures (Signed)
Patient Name: Mario Davis  MRN: [id number]  Epilepsy Attending: Lora Havens  Referring Physician/Provider: Lora Havens, MD  Duration: 02/14/2023 1049 to 02/15/2023 1049  Patient history: 27 year old male admitted to epilepsy monitoring unit for characterization of seizure-like episodes. EEG to evaluate for seizure.   Level of alertness: Awake, asleep  AEDs during EEG study: None  Technical aspects: This EEG study was done with scalp electrodes positioned according to the 10-20 International system of electrode placement. Electrical activity was reviewed with band pass filter of 1-70Hz , sensitivity of 7 uV/mm, display speed of 12mm/sec with a 60Hz  notched filter applied as appropriate. EEG data were recorded continuously and digitally stored.  Video monitoring was available and reviewed as appropriate.  Description: The posterior dominant rhythm consists of 9 Hz activity of moderate voltage (25-35 uV) seen predominantly in posterior head regions, symmetric and reactive to eye opening and eye closing. Sleep was characterized by vertex waves, sleep spindles (12 to 14 Hz), maximal frontocentral region. Hyperventilation and photic stimulation were not performed.     IMPRESSION: This study is within normal limits. No seizures or definite epileptiform discharges were seen throughout the recording.  [name] Barbra Sarks

## 2023-02-15 NOTE — Progress Notes (Signed)
Subjective: NAEO.   ROS: negative except above  Examination  Vital signs in last 24 hours: Temp:  [97.8 F (36.6 C)-98.3 F (36.8 C)] 97.8 F (36.6 C) (04/02 0857) Pulse Rate:  [63-85] 63 (04/02 0857) Resp:  [17] 17 (04/02 0857) BP: (100-124)/(51-74) 102/73 (04/02 0857) SpO2:  [97 %-100 %] 97 % (04/02 0857)  General: lying in bed, NAD Neuro: MS: Alert, oriented, follows commands CN: pupils equal and reactive,  EOMI, face symmetric, tongue midline, normal sensation over face, Motor: 5/5 strength in all 4 extremities Coordination: normal Gait: not tested  Basic Metabolic Panel: Recent Labs  Lab 02/14/23 1036  NA 138  K 3.1*  CL 105  CO2 24  GLUCOSE 129*  BUN 16  CREATININE 1.15  CALCIUM 8.8*  MG 1.8  PHOS 2.8    CBC: Recent Labs  Lab 02/14/23 1036  WBC 6.1  NEUTROABS 2.8  HGB 14.4  HCT 40.6  MCV 89.4  PLT 280     Coagulation Studies: Recent Labs    02/14/23 1036  LABPROT 13.4  INR 1.0    Imaging No new brain imaging  ASSESSMENT AND PLAN: 27 year old male admitted to epilepsy monitoring unit for characterization of seizure-like episodes.   Seizures -Continue video EEG for characterization of spells -Continue to hold Depakote and Vimpat -Plan for HV, photic stimulation and sleep deprivation today -Seizure precautions -As needed IV Ativan for clinical seizures   Anxiety -As needed clonazepam  Hypokalemia - PO replacement  Hyperglycemia - Blood glucose 129, will check A1c  I have spent a total of  36  minutes with the patient reviewing hospital notes,  test results, labs and examining the patient as well as establishing an assessment and plan that was discussed personally with the patient.  > 50% of time was spent in direct patient care.   Zeb Comfort Epilepsy [facility] For questions after 5pm please refer to AMION to reach the Neurologist on call

## 2023-02-16 ENCOUNTER — Inpatient Hospital Stay (HOSPITAL_COMMUNITY): Payer: Medicaid Other

## 2023-02-16 ENCOUNTER — Other Ambulatory Visit (HOSPITAL_COMMUNITY): Payer: Self-pay

## 2023-02-16 DIAGNOSIS — R569 Unspecified convulsions: Secondary | ICD-10-CM | POA: Diagnosis not present

## 2023-02-16 LAB — HEMOGLOBIN A1C
Hgb A1c MFr Bld: 5.6 % (ref 4.8–5.6)
Mean Plasma Glucose: 114 mg/dL

## 2023-02-16 MED ORDER — KETOROLAC TROMETHAMINE 30 MG/ML IJ SOLN
30.0000 mg | Freq: Once | INTRAMUSCULAR | Status: AC
  Administered 2023-02-16: 30 mg via INTRAVENOUS
  Filled 2023-02-16: qty 1

## 2023-02-16 MED ORDER — LIDOCAINE VISCOUS HCL 2 % MT SOLN
15.0000 mL | OROMUCOSAL | Status: DC | PRN
  Administered 2023-02-16: 15 mL via OROMUCOSAL
  Filled 2023-02-16 (×2): qty 15

## 2023-02-16 MED ORDER — SODIUM CHLORIDE 0.9 % IV SOLN
8.0000 mg | Freq: Three times a day (TID) | INTRAVENOUS | Status: DC | PRN
  Filled 2023-02-16: qty 4

## 2023-02-16 MED ORDER — XCOPRI 14 X 50 MG & 14 X100 MG PO TBPK
ORAL_TABLET | ORAL | 0 refills | Status: DC
  Filled 2023-02-16: qty 1, fill #0

## 2023-02-16 MED ORDER — DIVALPROEX SODIUM 250 MG PO DR TAB
1000.0000 mg | DELAYED_RELEASE_TABLET | Freq: Two times a day (BID) | ORAL | Status: DC
  Administered 2023-02-16 – 2023-02-17 (×2): 1000 mg via ORAL
  Filled 2023-02-16 (×2): qty 4

## 2023-02-16 MED ORDER — XCOPRI 14 X 12.5 MG & 14 X 25 MG PO TBPK
ORAL_TABLET | ORAL | 0 refills | Status: DC
  Filled 2023-02-16: qty 28, 28d supply, fill #0

## 2023-02-16 MED ORDER — VALPROATE SODIUM 100 MG/ML IV SOLN
3000.0000 mg | INTRAVENOUS | Status: AC
  Administered 2023-02-16: 3000 mg via INTRAVENOUS
  Filled 2023-02-16: qty 30

## 2023-02-16 MED ORDER — VALTOCO 20 MG DOSE 10 MG/0.1ML NA LQPK
NASAL | 0 refills | Status: DC
  Filled 2023-02-16: qty 10, 30d supply, fill #0

## 2023-02-16 MED ORDER — LACOSAMIDE 200 MG PO TABS
200.0000 mg | ORAL_TABLET | Freq: Two times a day (BID) | ORAL | Status: DC
  Administered 2023-02-16 – 2023-02-17 (×3): 200 mg via ORAL
  Filled 2023-02-16 (×3): qty 1

## 2023-02-16 NOTE — Progress Notes (Addendum)
Subjective: Had one seizure last night. Has right sided headache this morning. Also bit his tongue so reporting pain  ROS: negative except above  Examination  Vital signs in last 24 hours: Temp:  [97.7 F (36.5 C)-98.6 F (37 C)] 97.9 F (36.6 C) (04/03 1202) Pulse Rate:  [68-91] 83 (04/03 1202) Resp:  [16-21] 19 (04/03 1202) BP: (100-123)/(48-80) 115/75 (04/03 1202) SpO2:  [94 %-97 %] 97 % (04/03 1202)  General: lying in bed, NAD Neuro: MS: Alert, oriented, follows commands CN: pupils equal and reactive,  EOMI, face symmetric, tongue midline, normal sensation over face, Motor: 5/5 strength in all 4 extremities except 4/5 in right hand  Reflexes: 2+ bilaterally over patella, biceps, plantars: flexor Coordination: normal Gait: not tested  Basic Metabolic Panel: Recent Labs  Lab 02/14/23 1036  NA 138  K 3.1*  CL 105  CO2 24  GLUCOSE 129*  BUN 16  CREATININE 1.15  CALCIUM 8.8*  MG 1.8  PHOS 2.8    CBC: Recent Labs  Lab 02/14/23 1036  WBC 6.1  NEUTROABS 2.8  HGB 14.4  HCT 40.6  MCV 89.4  PLT 280     Coagulation Studies: Recent Labs    02/14/23 1036  LABPROT 13.4  INR 1.0    Imaging No new brain imaging   ASSESSMENT AND PLAN: 27 year old male admitted to epilepsy monitoring unit for characterization of seizure-like episodes.   Seizures -Continue video EEG to monitor for seizure recurrence -Load with Depajote 3000mg  once and start 1000mg  BID tonight - Resume Vimpat 200mg  BID - Discussed EEG findings with patient - Also updated Dr April Manson ( primary neurologist). Recommended adding Xcopri - Rescue medication: intranasal valtoco 20mg  for clinical seizure lasting over 2 minutes -Seizure precautions -As needed IV versed for clinical seizures - Plan to dc tomorrow   Anxiety -As needed clonazepam  Headache - PRN toradol  Tongue bite - local lidocaine   I have spent a total of  38  minutes with the patient reviewing hospital notes,  test  results, labs and examining the patient as well as establishing an assessment and plan that was discussed personally with the patient.  > 50% of time was spent in direct patient care.   Zeb Comfort Epilepsy [facility] For questions after 5pm please refer to AMION to reach the Neurologist on call

## 2023-02-16 NOTE — Progress Notes (Signed)
vLTM maintenance  All impedances below 10kohms  No skin breakdown noted at [place]

## 2023-02-16 NOTE — Progress Notes (Signed)
EMU maint complete - no skin breakdown under: fp1,f7,

## 2023-02-16 NOTE — TOC Benefit Eligibility Note (Signed)
Patient Teacher, English as a foreign language completed.    The patient is currently admitted and upon discharge could be taking Xcopri 100 mg tablets.  The current 30 day co-pay is $4.00.   The patient is currently admitted and upon discharge could be taking Valtoco 20 mg dose.  The current 30 day co-pay is $4.00.   The patient is insured through [employer] [place] [employer]   This test claim was processed through [place] amounts may vary at other pharmacies due to pharmacy/plan contracts, or as the patient moves through the different stages of their insurance plan.  Lyndel Safe, [place] Patient Advocate Specialist [place] Patient Advocate Team Direct Number: [phone]  Fax: [phone]

## 2023-02-16 NOTE — Procedures (Signed)
Patient Name: Mario Davis  MRN: [id number]  Epilepsy Attending: Lora Havens  Referring Physician/Provider: Lora Havens, MD  Duration: 02/15/2023 1049 to 02/16/2023 1049   Patient history: 27 year old male admitted to epilepsy monitoring unit for characterization of seizure-like episodes. EEG to evaluate for seizure.    Level of alertness: Awake, asleep   AEDs during EEG study: None   Technical aspects: This EEG study was done with scalp electrodes positioned according to the 10-20 International system of electrode placement. Electrical activity was reviewed with band pass filter of 1-70Hz , sensitivity of 7 uV/mm, display speed of 66mm/sec with a 60Hz  notched filter applied as appropriate. EEG data were recorded continuously and digitally stored.  Video monitoring was available and reviewed as appropriate.   Description: The posterior dominant rhythm consists of 9 Hz activity of moderate voltage (25-35 uV) seen predominantly in posterior head regions, symmetric and reactive to eye opening and eye closing. Sleep was characterized by vertex waves, sleep spindles (12 to 14 Hz), maximal frontocentral region. Spike was noted in anterior temporal region ( F8>FP2/T8/P8). Photic driving was not seen during photic stimulation. No EEG change was seen during hyperventilation.   Seizure was noted on 02/15/2023 at 2135. Patient was sitting in bed, looking at laptop. No clinical signs were noted. At the onset, EEG showed sharply contoured 5-6Hz  theta slowing in right temporal region ( maximal T8) which then evolved to 3-5hz  theta-delta slowing in right frontotemporal region. EEG then involved all of right hemisphere as well as left temporal region. Seizure ended at 2137.   Seizure was noted on 02/16/2023 at 0239. Patient was sitting in bed, looking at laptop. No clinical signs were noted initially for about a minute. Subsequently patient had left head turn, left arm ws abducted and flexed to  chest. Left arm then extended followed by right leg extension ( left leg was under covers and not visible). This was followed by right arm jerking and eventually generalized tonic clonic seizure. At the onset, EEG showed sharply contoured 5-6Hz  theta slowing in right temporal region ( maximal T8) which then evolved to 3-5hz  theta-delta slowing in right frontotemporal region. EEG then involved all of right hemisphere and subsequently left hemisphere. After seizure ended, eeg showed generalized back ground suppression. Seizure ended at 0242.   ABNORMALITY - Spike, right anterior temporal region -  Focal seizure, right temporal region   IMPRESSION: This study showed two focal seizures as described above on 02/15/2023 at 2135 and on 02/16/2023 at 0239, lasting about 2 and 3 minutes respectively. Additionally there is epileptogenicity in right anterior temporal region.                 Rodriques Badie Barbra Sarks

## 2023-02-17 ENCOUNTER — Other Ambulatory Visit (HOSPITAL_COMMUNITY): Payer: Self-pay

## 2023-02-17 ENCOUNTER — Encounter (HOSPITAL_COMMUNITY): Payer: Self-pay | Admitting: Neurology

## 2023-02-17 DIAGNOSIS — G40109 Localization-related (focal) (partial) symptomatic epilepsy and epileptic syndromes with simple partial seizures, not intractable, without status epilepticus: Secondary | ICD-10-CM | POA: Diagnosis not present

## 2023-02-17 DIAGNOSIS — R569 Unspecified convulsions: Secondary | ICD-10-CM | POA: Diagnosis not present

## 2023-02-17 MED ORDER — XCOPRI 50 MG PO TABS: ORAL_TABLET | ORAL | 0 refills | Status: AC

## 2023-02-17 MED ORDER — XCOPRI 14 X 50 MG & 14 X100 MG PO TBPK
ORAL_TABLET | ORAL | 0 refills | Status: DC
  Filled 2023-02-17: qty 1, fill #0

## 2023-02-17 MED ORDER — XCOPRI 14 X 50 MG & 14 X100 MG PO TBPK: ORAL_TABLET | ORAL | 0 refills | Status: DC

## 2023-02-17 NOTE — Progress Notes (Signed)
EEG complete - results pending 

## 2023-02-17 NOTE — Discharge Instructions (Addendum)
You were admitted to epilepsy monitoring unit between 02/14/2023 12/19/2022.  During this time, your antiseizure medications were held.  You underwent hyperventilation, photic stimulation and sleep deprivation.  No seizures were recorded.  There were noted to be arising from the right side of your brain.  We resumed your home medications.  I also discussed your case with the referring physician Dr. April Manson.  He recommended adding a new antiseizure medication Xcopri.  You have been prescribed this medication and a follow-up with Dr. April Manson has been scheduled.  You have also been prescribed a rescue medication for seizures intranasal Valtoco.  Seizure precautions including do not drive were discussed.  We also discussed referral to tertiary care center like [facility] for presurgical evaluation or VNS.

## 2023-02-17 NOTE — Progress Notes (Signed)
LTM maint complete - no skin breakdown under:  F3,CZ,FP2

## 2023-02-17 NOTE — Procedures (Signed)
Patient Name: Mario Davis  MRN: [id number]  Epilepsy Attending: Lora Havens  Referring Physician/Provider: Lora Havens, MD  Duration: 02/16/2023 1049 to 02/17/2023 0858   Patient history: 27 year old male admitted to epilepsy monitoring unit for characterization of seizure-like episodes. EEG to evaluate for seizure.    Level of alertness: Awake, asleep   AEDs during EEG study: VPA, Vimpat   Technical aspects: This EEG study was done with scalp electrodes positioned according to the 10-20 International system of electrode placement. Electrical activity was reviewed with band pass filter of 1-70Hz , sensitivity of 7 uV/mm, display speed of 82mm/sec with a 60Hz  notched filter applied as appropriate. EEG data were recorded continuously and digitally stored.  Video monitoring was available and reviewed as appropriate.   Description: The posterior dominant rhythm consists of 9 Hz activity of moderate voltage (25-35 uV) seen predominantly in posterior head regions, symmetric and reactive to eye opening and eye closing. Sleep was characterized by vertex waves, sleep spindles (12 to 14 Hz), maximal frontocentral region. Spike was noted in anterior temporal region ( F8>FP2/T8/P8).    ABNORMALITY - Spike, right anterior temporal region   IMPRESSION: This study showed evidence of epileptogenicity in right anterior temporal region. No seizures were seen during this study.  Mario Davis

## 2023-02-17 NOTE — Discharge Summary (Signed)
Physician Discharge Summary  Patient ID: Mario Davis MRN: [id number] DOB/AGE: [birth year] 27 y.o.  Admit date: 02/14/2023 Discharge date: 02/17/2023  Admission Diagnoses: Seizure  Discharge Diagnoses: Focal epilepsy  Discharged Condition: stable  Hospital Course: Mario Davis was admitted to epilepsy monitoring unit from 02/14/2023 12/19/2022.  During this time, he underwent continuous video EEG monitoring.  Antiseizure medications were held.  Hyperventilation, photic stimulation and sleep deprivation were performed for seizure provocation.  EEG showed mild skin right anterior temporal region.  2 typical seizures were recorded as described in the EMU report arising from right temporal region.  Case was discussed with referring physician Dr. April Manson and Mario Davis was added.  Home antiseizure medication including Depakote and Vimpat was resumed.  Rescue medication intranasal Valtoco was also prescribed.  Diagnosis of epilepsy, referral for presurgical workup to tertiary care center and VNS were discussed.  Seizure precautions including do not drive or discussed.  Importance of medication compliance and risk of SUDEP was discussed.  Consults: None  Significant Diagnostic Studies:   Patient Name: Mario Davis  MRN: [id number]  Epilepsy Attending: Lora Havens  Referring Physician/Provider: Lora Havens, MD  Duration: 02/15/2023 1049 to 02/16/2023 1049   Patient history: 27 year old male admitted to epilepsy monitoring unit for characterization of seizure-like episodes. EEG to evaluate for seizure.    Level of alertness: Awake, asleep   AEDs during EEG study: None   Technical aspects: This EEG study was done with scalp electrodes positioned according to the 10-20 International system of electrode placement. Electrical activity was reviewed with band pass filter of 1-70Hz , sensitivity of 7 uV/mm, display speed of 29mm/sec with a 60Hz  notched filter applied as  appropriate. EEG data were recorded continuously and digitally stored.  Video monitoring was available and reviewed as appropriate.   Description: The posterior dominant rhythm consists of 9 Hz activity of moderate voltage (25-35 uV) seen predominantly in posterior head regions, symmetric and reactive to eye opening and eye closing. Sleep was characterized by vertex waves, sleep spindles (12 to 14 Hz), maximal frontocentral region. Spike was noted in anterior temporal region ( F8>FP2/T8/P8). Photic driving was not seen during photic stimulation. No EEG change was seen during hyperventilation.    Seizure was noted on 02/15/2023 at 2135. Patient was sitting in bed, looking at laptop. No clinical signs were noted. At the onset, EEG showed sharply contoured 5-6Hz  theta slowing in right temporal region ( maximal T8) which then evolved to 3-5hz  theta-delta slowing in right frontotemporal region. EEG then involved all of right hemisphere as well as left temporal region. Seizure ended at 2137.    Seizure was noted on 02/16/2023 at 0239. Patient was sitting in bed, looking at laptop. No clinical signs were noted initially for about a minute. Subsequently patient had left head turn, left arm ws abducted and flexed to chest. Left arm then extended followed by right leg extension ( left leg was under covers and not visible). This was followed by right arm jerking and eventually generalized tonic clonic seizure. At the onset, EEG showed sharply contoured 5-6Hz  theta slowing in right temporal region ( maximal T8) which then evolved to 3-5hz  theta-delta slowing in right frontotemporal region. EEG then involved all of right hemisphere and subsequently left hemisphere. After seizure ended, eeg showed generalized back ground suppression. Seizure ended at 0242.    ABNORMALITY - Spike, right anterior temporal region -  Focal seizure, right temporal region   IMPRESSION: This study showed two focal seizures  as described above on  02/15/2023 at 2135 and on 02/16/2023 at 0239, lasting about 2 and 3 minutes respectively. Additionally there is epileptogenicity in right anterior temporal region.                       Treatments: Continued Depakote 1000 mg twice daily and Vimpat 200 mg twice daily.  Start Xcopri at 12.5 mg daily and gradually ramp up to 100 mg daily.  Discharge Exam: Blood pressure 112/64, pulse 67, temperature 98.1 F (36.7 C), temperature source Oral, resp. rate 20, SpO2 95 %.  General: lying in bed, NAD Neuro: MS: Alert, oriented, follows commands CN: pupils equal and reactive,  EOMI, face symmetric, tongue midline, normal sensation over face, Motor: 5/5 strength in all 4 extremities except 4/5 in right hand  Reflexes: 2+ bilaterally over patella, biceps, plantars: flexor Coordination: normal Gait: not tested  Discharge disposition: 01-Home or Self Care   Discharge Instructions     Call MD for:   Complete by: As directed    During the Seizure   - First, ensure adequate ventilation and place patients on the floor on their left side  Loosen clothing around the neck and ensure the airway is patent. If the patient is clenching the teeth, do not force the mouth open with any object as this can cause severe damage - Remove all items from the surrounding that can be hazardous. The patient may be oblivious to what's happening and may not even know what he or she is doing. If the patient is confused and wandering, either gently guide him/her away and block access to outside areas - Reassure the individual and be comforting - Call 911. In most cases, the seizure ends before EMS arrives. However, there are cases when seizures may last over 3 to 5 minutes. Or the individual may have developed breathing difficulties or severe injuries. If a pregnant patient or a person with diabetes develops a seizure, it is prudent to call an ambulance. - Finally, if the patient does not regain full consciousness,  then call EMS. Most patients will remain confused for about 45 to 90 minutes after a seizure, so you must use judgment in calling for help.    After the Seizure (Postictal Stage)   After a seizure, most patients experience confusion, fatigue, muscle pain and/or a headache. Thus, one should permit the individual to sleep. For the next few days, reassurance is essential. Being calm and helping reorient the person is also of importance.   Most seizures are painless and end spontaneously. Seizures are not harmful to others but can lead to complications such as stress on the lungs, brain and the heart. Individuals with prior lung problems may develop labored breathing and respiratory distress.        Diet - low sodium heart healthy   Complete by: As directed    Discharge instructions   Complete by: As directed    Seizure precautions: Per [facility] statutes, patients with seizures are not allowed to drive until they have been seizure-free for six months and cleared by a physician    Use caution when using heavy equipment or power tools. Avoid working on ladders or at heights. Take showers instead of baths. Ensure the water temperature is not too high on the home water heater. Do not go swimming alone. Do not lock yourself in a room alone (i.e. bathroom). When caring for infants or small children, sit down when holding, feeding, or  changing them to minimize risk of injury to the child in the event you have a seizure. Maintain good sleep hygiene. Avoid alcohol.    If patient has another seizure, call 911 and bring them back to the ED if: A.  The seizure lasts longer than 5 minutes.      B.  The patient doesn't wake shortly after the seizure or has new problems such as difficulty seeing, speaking or moving following the seizure C.  The patient was injured during the seizure D.  The patient has a temperature over 102 F (39C) E.  The patient vomited during the seizure and now is having trouble  breathing      Increase activity slowly   Complete by: As directed       Allergies as of 02/17/2023   No Known Allergies      Medication List     TAKE these medications    clonazePAM 1 MG tablet Commonly known as: KLONOPIN Take 1 tablet (1 mg total) by mouth 2 (two) times daily.   cyclobenzaprine 10 MG tablet Commonly known as: FLEXERIL Take 1 tablet (10 mg total) by mouth as needed for up to 10 doses for muscle spasms.   divalproex 500 MG DR tablet Commonly known as: Depakote Take 2 tablets (1,000 mg total) by mouth 2 (two) times daily.   ibuprofen 200 MG tablet Commonly known as: ADVIL Take 200 mg by mouth every 6 (six) hours as needed for moderate pain.   lacosamide 200 MG Tabs tablet Commonly known as: VIMPAT Take 1 tablet (200 mg total) by mouth 2 (two) times daily.   Valtoco 20 MG Dose 2 x 10 MG/0.1ML Lqpk Generic drug: diazePAM (20 MG Dose) Spray 2 devices, 1 in each nostril as needed for seizures. Do not use again within 5 days of last use. Max of 5 doses per month.   Xcopri 14 x 12.5 MG & 14 x 25 MG Tbpk Generic drug: Cenobamate Please follow package directions: 4/4 - 4/17: Take 12.5mg  daily 4/18 - 5/1: Take 25mg  daily   Xcopri 14 x 50 MG & 14 x100 MG Tbpk Generic drug: Cenobamate Please follow package directions: 5/2- 15/15 : Take 50 mg daily 5/16 - 5/29: Take 100 mg daily        I have spent a total of  40  minutes with the patient reviewing hospital notes,  test results, labs and examining the patient as well as establishing an assessment and plan that was discussed personally with the patient.  > 50% of time was spent in direct patient care.     Signed: Lora Havens 02/17/2023, 9:01 AM

## 2023-02-17 NOTE — TOC Transition Note (Signed)
Transition of Care [facility]) - CM/SW Discharge Note   Patient Details  Name: [name] MRN: [id number] Date of Birth: Jul 25, 1996  Transition of [facility]) CM/SW Contact:  Pollie Friar, RN Phone Number: 02/17/2023, 11:46 AM   Clinical Narrative:    Pt is discharging home with self care. No needs per TOC.   Final next level of care: Home/Self Care Barriers to Discharge: No Barriers Identified   Patient Goals and CMS Choice      Discharge Placement                         Discharge Plan and Services Additional resources added to the After Visit Summary for                                       Social Determinants of Health (SDOH) Interventions SDOH Screenings   Food Insecurity: No Food Insecurity (02/14/2023)  Housing: Low Risk  (02/14/2023)  Transportation Needs: No Transportation Needs (02/14/2023)  Utilities: Not At Risk (02/14/2023)  Depression (PHQ2-9): Low Risk  (01/19/2021)  Tobacco Use: Low Risk  (02/17/2023)     Readmission Risk Interventions     No data to display

## 2023-02-24 ENCOUNTER — Other Ambulatory Visit: Payer: Self-pay | Admitting: Neurology

## 2023-03-08 ENCOUNTER — Telehealth: Payer: Self-pay

## 2023-03-08 NOTE — Telephone Encounter (Signed)
Spoke with [employer] rep who is trying to complete PA for patient's EEG. [employer] [place] and MD are not covered by [employer] (patient's current insurance) so PA will be denied unless we change the company we use for procedure. Rep states she was trying to get an NPI that would be covered and would call back with any information, but wanted to know your thoughts on whether to change where we send orders or not

## 2023-03-08 NOTE — Telephone Encounter (Signed)
Lets cancel the amb eeg. He did have a long term EEG in his last admission in the hospital. Thanks

## 2023-03-08 NOTE — Telephone Encounter (Signed)
Called Therapist, occupational and PA rep and informed them of the cancellation, [employer] stated they would call patient and update him

## 2023-03-31 ENCOUNTER — Encounter: Payer: Self-pay | Admitting: Neurology

## 2023-03-31 ENCOUNTER — Ambulatory Visit (INDEPENDENT_AMBULATORY_CARE_PROVIDER_SITE_OTHER): Payer: Medicaid Other | Admitting: Neurology

## 2023-03-31 VITALS — BP 122/68 | HR 73 | Ht 73.0 in | Wt 204.0 lb

## 2023-03-31 DIAGNOSIS — G40919 Epilepsy, unspecified, intractable, without status epilepticus: Secondary | ICD-10-CM

## 2023-03-31 DIAGNOSIS — G44209 Tension-type headache, unspecified, not intractable: Secondary | ICD-10-CM | POA: Diagnosis not present

## 2023-03-31 NOTE — Progress Notes (Signed)
[facility]  PATIENT: Mario Davis DOB: Aug 16, 1996  REFERRING CLINICIAN: Janeece Agee, NP HISTORY FROM: Patient and [name]  REASON FOR VISIT: Epilepsy follow up.    HISTORICAL  CHIEF COMPLAINT:  Chief Complaint  Patient presents with   Follow-up    Rm 16, f/u sz,   INTERVAL HISTORY 03/31/2023:  Daniela presents today for follow-up, he is accompanied by girlfriend.  At last visit in February we had planned to send him to the EMU.  He did have the admission, seizures were captured coming from the right temporal region.  He did also have epileptiform discharges in the right temporal region.  Upon discharge Edison Nasuti was added.  Currently he is on Depakote 750 mg twice daily, Vimpat 200 mg twice daily and Xcopri currently at 50 mg daily.  He does report side effect of tremor but has not had any seizures since discharge from the hospital.  Overall he is doing well.  He is still anxious about his seizures and reports compliance with his medications, and good sleep habits. He is also applying for disability as he is unable to drive    INTERVAL HISTORY 01/04/2023:  Patient presents today for follow-up, he is accompanied by her significant other.  He is following up for his seizure.  He continued to have breakthrough seizures despite being on Vimpat 200 mg twice daily and Depakote 750 mg twice daily.  He reports compliance with his medicine but with each of his breakthrough seizure his antiseizure levels are undetectable.  Again patient is adamant that he is taking his medications as prescribed, he has not miss any dose.  He also has missed few follow-up with me.  He feels he is having seizures during his sleep because he will wake up in the morning with sore jaw and tiredness. Girlfriend also reported that at night he will shake during his sleep and become unresponsive. He is also complaining of worsening anxiety.  So far he has tried Levetiracetam,  Lamotrigine, Lacosamide and Valproic acid    INTERVAL HISTORY 06/02/2022 Patient presents today for follow-up, last visit was in January.  At that time he was doing well on Lamotrigine 100 mg BID.  Unfortunately in April, he did have a breakthrough seizure.  At that time his lamotrigine level was 1.7.  Patient reports compliance with the medication.  I increase the lamotrigine to 150 mg twice daily.  Since then he has been having worsening headache, complaint of numbness and tingling in the mainly right arm and right leg but no seizures.   INTERVAL HISTORY 11/19/2021:  Patient presents today for follow-up, last visit was on October 3.  At that time plan was to increase Lamictal 200 mg twice daily.  He reported compliance with medication, denies any seizures since last visit, denies any side effect from the medication.  No other concerns or complaints.  Interval History 08/17/2021:  Patient presented for follow-up, last visit was July 28, at that time he was started on lamotrigine 25 mg with plan to uptitrate until 100 mg twice a day.  For some reason, patient is currently taking 100 mg in the morning and 25 mg at night.  He said that he was not aware that his evening dose was supposed to be 100 mg.  He denies any seizures since last visit, denies any new rash but states that lamotrigine is keeping him up at night.  He takes his evening dose around 8 PM.  No other complaints.  He is currently not working.   HISTORY OF PRESENT ILLNESS:  This is a 27 year old man with no past medical history who is presenting with new onset seizure.  Patient reported 2 weeks ago he had his first lifetime seizure.  Per girlfriend who accompanied patient today she reported patient was asleep and she noted that he was staring at the ceiling, then followed by left face twitching followed by generalized body convulsions.  The entire episode lasted about 2 to 3-minute, after which he was very confused and combative.  He did not  remember where he was, he wanted to leave the house and it took him about 30 minutes to regain consciousness.  He was taken to a local ED and admitted for additional work-up.  Initial lab work-up was unrevealing.  He did have a normal MRI of the brain.  He also have a routine EEG which was normal.  He was started on Keppra and discharged home.  Patient stated since starting the Keppra, he has been having headaches and pain behind eyes and he noted also that blood in the stool.  He also reported he was having some mood changes, he became angry for no reason.  He has discontinued the Keppra 4 days ago. He has a family history of seizures mother and uncle had seizures.  He denies any previous history of head trauma no other seizure risk factors noted. Since being discharged only ED he has not had any additional seizure.  He is currently not driving.  She understand that he is not supposed to drive for at least 6 months.  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  He continues to play but has not had any additional incident  Currently he denies any headaches no change in vision no abdominal pain no other complaint.   One additional issue that he brought up is chest pain 4 months ago, described as could not breathe, happened every other night, seen his PMD, had a EKG and told that everything is fine.   REVIEW OF SYSTEMS: Full 14 system review of systems performed and negative with exception of: except as noted in the HPI  ALLERGIES: No Known Allergies  HOME MEDICATIONS: Outpatient Medications Prior to Visit  Medication Sig Dispense Refill   Cenobamate (XCOPRI) 50 MG TABS Take 1 tablet (50 mg total) by mouth daily at 12 noon for 14 days, THEN 2 tablets (100 mg total) daily at 12 noon for 14  days. 42 tablet 0   diazePAM, 20 MG Dose, (VALTOCO 20 MG DOSE) 2 x 10 MG/0.1ML LQPK Spray 2 devices, 1 in each nostril as needed for seizures. Do not use again within 5 days of last use. Max of 5 doses per month. 10 each 0   divalproex (DEPAKOTE) 500 MG DR tablet Take 2 tablets (1,000 mg total) by mouth 2 (two) times daily. 120 tablet 6   ibuprofen (ADVIL) 200 MG tablet Take 200 mg by mouth every 6 (six) hours as needed for moderate pain.     lacosamide (VIMPAT) 200 MG TABS tablet Take 1 tablet (200 mg total) by mouth 2 (two) times daily. 60 tablet 6   Cenobamate (XCOPRI) 14 x 12.5 MG & 14 x 25 MG TBPK Please follow package directions:  4/4 - 4/17: Take 12.5mg  daily 4/18 - 5/1: Take 25mg  daily (Patient not taking: Reported on 03/31/2023) 28 each 0   Cenobamate (XCOPRI) 14 x 50 MG & 14 x100 MG TBPK Please follow package directions: 5/2- 15/15 : Take 50 mg daily 5/16 - 5/29: Take 100 mg daily (Patient not taking: Reported on 03/31/2023) 28 each 0   clonazePAM (KLONOPIN) 1 MG tablet TAKE 1 TABLET(1 MG) BY MOUTH TWICE DAILY 60 tablet 0   cyclobenzaprine (FLEXERIL) 10 MG tablet Take 1 tablet (10 mg total) by mouth as needed for up to 10 doses for muscle spasms. (Patient not taking: Reported on 01/04/2023) 10 tablet 0   No facility-administered medications prior to visit.    PAST MEDICAL HISTORY: Past Medical History:  Diagnosis Date   Seizures (HCC)     PAST SURGICAL HISTORY: History reviewed. No pertinent surgical history.  FAMILY HISTORY: Family History  Problem Relation Age of Onset   Seizures Mother    Hypertension Father     SOCIAL HISTORY: Social History   Socioeconomic History   Marital status: Significant Other    Spouse name: Not on file   Number of children: 0   Years of education: Not on file   Highest education level: Not on file  Occupational History   Not on file  Tobacco Use   Smoking status: Never   Smokeless tobacco: Never  Vaping Use   Vaping Use: Never used   Substance and Sexual Activity   Alcohol use: Yes   Drug use: No   Sexual activity: Not on file  Other Topics Concern   Not on file  Social History Narrative   Lives with girlfriend [name]   Right Handed   Drinks 6-8 cups caffeine daily   Social Determinants of Health   Financial Resource Strain: Not on file  Food Insecurity: No Food Insecurity (02/14/2023)   Hunger Vital Sign    Worried About Running Out of Food in the Last Year: Never true    Ran Out of Food in the Last Year: Never true  Transportation Needs: No Transportation Needs (02/14/2023)   PRAPARE - Administrator, Civil Service (Medical): No    Lack of Transportation (Non-Medical): No  Physical Activity: Not on file  Stress: Not on file  Social Connections: Not on file  Intimate Partner Violence: Not At Risk (02/14/2023)   Humiliation, Afraid, Rape, and Kick questionnaire    Fear of Current or Ex-Partner: No    Emotionally Abused: No    Physically Abused: No    Sexually Abused: No    PHYSICAL EXAM  GENERAL EXAM/CONSTITUTIONAL: Vitals:  Vitals:   03/31/23 1004  BP: 122/68  Pulse: 73  Weight: 204 lb (92.5 kg)  Height: 6\' 1"  (1.854 m)    Body mass index is 26.91 kg/m. Wt Readings from Last 3 Encounters:  03/31/23 204 lb (92.5 kg)  01/04/23 199 lb 8 oz (90.5 kg)  09/01/22 185 lb (83.9 kg)   Patient is in no distress; well developed, nourished and groomed; neck is supple   NEUROLOGIC: MENTAL STATUS:      No data to display         awake, alert, oriented to person, place and time recent and remote memory intact normal attention and concentration language fluent, comprehension intact, naming intact fund of knowledge appropriate  CRANIAL NERVE:  2nd, 3rd, 4th, 6th -visual fields full to confrontation, extraocular muscles intact, no nystagmus 5th - facial sensation symmetric 7th -  facial strength symmetric 8th - hearing intact 9th - palate elevates symmetrically, uvula midline 11th -  shoulder shrug symmetric 12th - tongue protrusion midline   GAIT/STATION:  normal   DIAGNOSTIC DATA (LABS, IMAGING, TESTING) - I reviewed patient records, labs, notes, testing and imaging myself where available.  Lab Results  Component Value Date   WBC 6.1 02/14/2023   HGB 14.4 02/14/2023   HCT 40.6 02/14/2023   MCV 89.4 02/14/2023   PLT 280 02/14/2023      Component Value Date/Time   NA 138 02/14/2023 1036   NA 139 01/04/2023 1137   K 3.1 (L) 02/14/2023 1036   CL 105 02/14/2023 1036   CO2 24 02/14/2023 1036   GLUCOSE 129 (H) 02/14/2023 1036   BUN 16 02/14/2023 1036   BUN 16 01/04/2023 1137   CREATININE 1.15 02/14/2023 1036   CALCIUM 8.8 (L) 02/14/2023 1036   PROT 6.9 02/14/2023 1036   PROT 7.5 01/04/2023 1137   ALBUMIN 3.7 02/14/2023 1036   ALBUMIN 4.5 01/04/2023 1137   AST 29 02/14/2023 1036   ALT 28 02/14/2023 1036   ALKPHOS 68 02/14/2023 1036   BILITOT 0.9 02/14/2023 1036   BILITOT 0.2 01/04/2023 1137   GFRNONAA >60 02/14/2023 1036   GFRAA >60 11/21/2017 2155   No results found for: "CHOL", "HDL", "LDLCALC", "LDLDIRECT", "TRIG", "CHOLHDL" Lab Results  Component Value Date   HGBA1C 5.6 02/15/2023   Lab Results  Component Value Date   VITAMINB12 338 07/13/2022   Lab Results  Component Value Date   TSH 0.368 05/27/2021    MRI Brain: Reviewed, within normal limits   Routine EEG: Normal routine EEG  EMU 02/16/23:  ABNORMALITY - Spike, right anterior temporal region -  Focal seizure, right temporal region   IMPRESSION: This study showed two focal seizures as described above on 02/15/2023 at 2135 and on 02/16/2023 at 0239, lasting about 2 and 3 minutes respectively. Additionally there is epileptogenicity in right anterior temporal region.   ASSESSMENT AND PLAN  27 y.o. year old male here for follow-up for his seizures and headaches.  EMU admission captured right anterior temporal epileptogenicity and 2 seizures coming from the right temporal region.   Currently he is on Vimpat 200 mg twice daily, Depakote to 750 mg twice daily and Xcopri 50 mg daily.  The plan will be to increase Xcopri to about 150 mg daily.  He is also on Klonopin as needed for anxiety and seizure auras.  Reports tremor, most likely side effect from the Vimpat.  Once he reaches Xcopri 100 mg daily, plan will be to decrease the Vimpat to 100 mg twice daily. If Edison Nasuti is not enough to control his seizures, plans will be for surgical management: VNS, RNS, or temporal lobectomy.  This was explained to the patient, he voiced understanding and would like to think avout it. I will see him in 3 months for follow up.      1. Intractable epilepsy without status epilepticus, unspecified epilepsy type (HCC)   2. Tension headache       Patient Instructions  Continue with Vimpat 200 mg twice daily Continue with Depakote 750 mg twice daily Continue with Xcopri 50 mg daily, follow up with titration to 100 mg daily in 2 weeks. When you reach Xcopri 100 mg daily, and if the tremor gets worse, please decrease Vimpat to 100 mg twice daily.  Please contact us if you do have a breakthrough seizure Will try him on Imitrex for his headaches  Follow-up in 3 months or sooner if worse.    --------------------------------------------   Per [facility] statutes, patients with seizures are not allowed to drive until they have been seizure-free for six months.  Other recommendations include using caution when using heavy equipment or power tools. Avoid working on ladders or at heights. Take showers instead of baths.  Do not swim alone.  Ensure the water temperature is not too high on the home water heater. Do not go swimming alone. Do not lock yourself in a room alone (i.e. bathroom). When caring for infants or small children, sit down when holding, feeding, or changing them to minimize risk of injury to the child in the event you have a seizure. Maintain good sleep hygiene. Avoid alcohol.   Also recommend adequate sleep, hydration, good diet and minimize stress.   During the Seizure  - First, ensure adequate ventilation and place patients on the floor on their left side  Loosen clothing around the neck and ensure the airway is patent. If the patient is clenching the teeth, do not force the mouth open with any object as this can cause severe damage - Remove all items from the surrounding that can be hazardous. The patient may be oblivious to what's happening and may not even know what he or she is doing. If the patient is confused and wandering, either gently guide him/her away and block access to outside areas - Reassure the individual and be comforting - Call 911. In most cases, the seizure ends before EMS arrives. However, there are cases when seizures may last over 3 to 5 minutes. Or the individual may have developed breathing difficulties or severe injuries. If a pregnant patient or a person with diabetes develops a seizure, it is prudent to call an ambulance. - Finally, if the patient does not regain full consciousness, then call EMS. Most patients will remain confused for about 45 to 90 minutes after a seizure, so you must use judgment in calling for help. - Avoid restraints but make sure the patient is in a bed with padded side rails - Place the individual in a lateral position with the neck slightly flexed; this will help the saliva drain from the mouth and prevent the tongue from falling backward - Remove all nearby furniture and other hazards from the area - Provide verbal assurance as the individual is regaining consciousness - Provide the patient with privacy if possible - Call for help and start treatment as ordered by the caregiver   After the Seizure (Postictal Stage)  After a seizure, most patients experience confusion, fatigue, muscle pain and/or a headache. Thus, one should permit the individual to sleep. For the next few days, reassurance is essential. Being calm  and helping reorient the person is also of importance.  Most seizures are painless and end spontaneously. Seizures are not harmful to others but can lead to complications such as stress on the lungs, brain and the heart. Individuals with prior lung problems may develop labored breathing and respiratory distress.     No orders of the defined types were placed in this encounter.    Meds ordered this encounter  Medications   clonazePAM (KLONOPIN) 1 MG tablet    Sig: Take 1 tablet (1 mg total) by mouth 2 (two) times daily.    Dispense:  60 tablet    Refill:  0   cyclobenzaprine (FLEXERIL) 10 MG tablet    Sig: Take 1 tablet (10 mg total) by mouth as needed  for up to 10 doses for muscle spasms.    Dispense:  10 tablet    Refill:  0   SUMAtriptan (IMITREX) 50 MG tablet    Sig: Take 1 tablet (50 mg total) by mouth every 2 (two) hours as needed for migraine. May repeat in 2 hours if headache persists or recurs.    Dispense:  10 tablet    Refill:  3     Return in about 3 months (around 07/01/2023).  I have spent a total of 60 minutes dedicated to this patient today, preparing to see patient, performing a medically appropriate examination and evaluation, discussing his recent EMU admission, surgical options if seizures not controlled, ordering tests and/or medications and procedures, and counseling and educating the patient/family/caregiver; independently interpreting result and communicating results to the family/patient/caregiver; and documenting clinical information in the electronic medical record.    Windell Norfolk, MD 04/01/2023, 8:51 AM  [facility] [address], [address] [place], [place] [address] [phone]

## 2023-04-01 MED ORDER — CYCLOBENZAPRINE HCL 10 MG PO TABS: 10.0000 mg | ORAL_TABLET | ORAL | 0 refills | Status: DC | PRN

## 2023-04-01 MED ORDER — SUMATRIPTAN SUCCINATE 50 MG PO TABS: 50.0000 mg | ORAL_TABLET | ORAL | 3 refills | Status: DC | PRN

## 2023-04-01 MED ORDER — CLONAZEPAM 1 MG PO TABS: 1.0000 mg | ORAL_TABLET | Freq: Two times a day (BID) | ORAL | 0 refills | Status: DC

## 2023-04-01 NOTE — Patient Instructions (Addendum)
Continue with Vimpat 200 mg twice daily Continue with Depakote 750 mg twice daily Continue with Xcopri 50 mg daily, follow up with titration to 100 mg daily in 2 weeks. When you reach Xcopri 100 mg daily, and if the tremor gets worse, please decrease Vimpat to 100 mg twice daily.  Please contact us if you do have a breakthrough seizure Will try him on Imitrex for his headaches Follow-up in 3 months or sooner if worse.

## 2023-04-25 ENCOUNTER — Ambulatory Visit: Payer: Medicaid Other | Admitting: Neurology

## 2023-04-27 ENCOUNTER — Encounter: Payer: Self-pay | Admitting: Neurology

## 2023-04-27 ENCOUNTER — Other Ambulatory Visit: Payer: Self-pay | Admitting: Neurology

## 2023-04-27 MED ORDER — XCOPRI 150 MG PO TABS: 150.0000 mg | ORAL_TABLET | Freq: Every day | ORAL | 5 refills | Status: DC

## 2023-04-27 NOTE — Telephone Encounter (Signed)
That's correct, he should be at 100 mg of xcopri. I am going to prescribed him the 150

## 2023-05-03 ENCOUNTER — Other Ambulatory Visit: Payer: Self-pay | Admitting: Neurology

## 2023-05-04 ENCOUNTER — Other Ambulatory Visit: Payer: Self-pay | Admitting: Neurology

## 2023-05-04 NOTE — Telephone Encounter (Signed)
Requested Prescriptions   Pending Prescriptions Disp Refills   clonazePAM (KLONOPIN) 1 MG tablet [Pharmacy Med Name: CLONAZEPAM 1MG  TABLETS] 60 tablet     Sig: TAKE 1 TABLET(1 MG) BY MOUTH TWICE DAILY   Last seen 03/31/23, next appt scheduled 07/05/23 Dispenses   Dispensed Days Supply Quantity Provider Pharmacy  CLONAZEPAM 1MG  TABLETS 04/01/2023 30 60 each Windell Norfolk, MD [facility] DR[facility] STORE #...  CLONAZEPAM 1MG  TABLETS 02/24/2023 30 60 each Windell Norfolk, MD [facility] DR[facility] STORE #...  CLONAZEPAM 1MG  TABLETS 01/04/2023 30 60 each Windell Norfolk, MD [facility] DR[facility] STORE #.[facility] Kitchen[facility] Kitchen

## 2023-05-30 ENCOUNTER — Encounter: Payer: Self-pay | Admitting: Neurology

## 2023-06-18 ENCOUNTER — Other Ambulatory Visit: Payer: Self-pay | Admitting: Neurology

## 2023-06-20 ENCOUNTER — Encounter: Payer: Self-pay | Admitting: Neurology

## 2023-06-20 ENCOUNTER — Other Ambulatory Visit: Payer: Self-pay

## 2023-06-20 MED ORDER — SUMATRIPTAN SUCCINATE 50 MG PO TABS: 50.0000 mg | ORAL_TABLET | ORAL | 6 refills | Status: AC | PRN

## 2023-06-20 MED ORDER — LACOSAMIDE 200 MG PO TABS: 200.0000 mg | ORAL_TABLET | Freq: Two times a day (BID) | ORAL | 5 refills | Status: DC

## 2023-06-20 MED ORDER — CLONAZEPAM 1 MG PO TABS: 1.0000 mg | ORAL_TABLET | Freq: Two times a day (BID) | ORAL | 5 refills | Status: DC

## 2023-06-20 MED ORDER — XCOPRI 150 MG PO TABS: 150.0000 mg | ORAL_TABLET | Freq: Every day | ORAL | 5 refills | Status: DC

## 2023-06-20 NOTE — Telephone Encounter (Signed)
Requested Prescriptions   Pending Prescriptions Disp Refills   Cenobamate (XCOPRI) 150 MG TABS 30 tablet 6    Sig: Take 1 tablet (150 mg total) by mouth daily.   SUMAtriptan (IMITREX) 50 MG tablet 10 tablet 6    Sig: Take 1 tablet (50 mg total) by mouth every 2 (two) hours as needed for migraine. May repeat in 2 hours if headache persists or recurs.   lacosamide (VIMPAT) 200 MG TABS tablet 60 tablet 6    Sig: Take 1 tablet (200 mg total) by mouth 2 (two) times daily.   clonazePAM (KLONOPIN) 1 MG tablet 60 tablet 6    Sig: Take 1 tablet (1 mg total) by mouth 2 (two) times daily.   Last seen 03/31/23, next appt scheduled 07/05/23  Dispenses   Dispensed Days Supply Quantity Provider Pharmacy  XCOPRI 150MG  TABLETS 04/28/2023 30 30 each Windell Norfolk, MD [facility] DRUG STORE #...  XCOPRI 50MG  TABLETS 03/17/2023 14 42 each Charlsie Quest, MD [facility] DRUG STORE #...  Cenobamate (XCOPRI) 14 x 12.5 MG & 14 x 25 MG TBPK 02/17/2023 28 28 each Charlsie Quest, MD [facility]...    Dispenses   Dispensed Days Supply Quantity Provider Pharmacy  LACOSAMIDE 200MG  TABLETS 05/04/2023 30 60 each Windell Norfolk, MD [facility] DRUG STORE #...  LACOSAMIDE 200MG  TABLETS 03/17/2023 30 60 each Windell Norfolk, MD [facility] DRUG STORE #...  LACOSAMIDE 200MG  TABLETS 01/25/2023 30 60 each Windell Norfolk, MD [facility] DRUG STORE #...  LACOSAMIDE 200MG  TABLETS 12/16/2022 30 60 each Windell Norfolk, MD [facility] DRUG STORE #...  LACOSAMIDE 200MG  TABLETS 11/02/2022 30 60 each Windell Norfolk, MD [facility] DRUG STORE #...  LACOSAMIDE 150MG  TABLETS 11/01/2022 30 60 each Windell Norfolk, MD [facility] DRUG STORE #...  LACOSAMIDE 200MG  TABLETS 09/13/2022 30 60 each Windell Norfolk, MD [facility] DRUG STORE #...  LACOSAMIDE 200MG  TABLETS 07/30/2022 30 60 each Windell Norfolk, MD [facility] DRUG STORE #...    Dispenses    Dispensed Days Supply Quantity Provider Pharmacy  CLONAZEPAM 1MG  TABLETS 05/04/2023 30 60  each Windell Norfolk, MD [facility] DRUG STORE #...  CLONAZEPAM 1MG  TABLETS 04/01/2023 30 60 each Windell Norfolk, MD [facility] DR[facility] STORE #...  CLONAZEPAM 1MG  TABLETS 02/24/2023 30 60 each Windell Norfolk, MD [facility] DRUG STORE #...  CLONAZEPAM 1MG  TABLETS 01/04/2023 30 60 each Windell Norfolk, MD [facility] DRUG STORE #.Marland KitchenMarland Kitchen

## 2023-06-22 ENCOUNTER — Other Ambulatory Visit: Payer: Self-pay | Admitting: Neurology

## 2023-06-27 MED ORDER — CYCLOBENZAPRINE HCL 10 MG PO TABS: 10.0000 mg | ORAL_TABLET | ORAL | 0 refills | Status: DC | PRN

## 2023-07-04 ENCOUNTER — Telehealth: Payer: Self-pay | Admitting: Neurology

## 2023-07-04 NOTE — Telephone Encounter (Signed)
 LVM and sent mychart msg informing pt of need to reschedule 07/05/23 appt - MD out

## 2023-07-05 ENCOUNTER — Ambulatory Visit: Payer: Medicaid Other | Admitting: Neurology

## 2023-08-30 ENCOUNTER — Other Ambulatory Visit: Payer: Self-pay | Admitting: Neurology

## 2023-09-05 ENCOUNTER — Encounter: Payer: Self-pay | Admitting: Neurology

## 2023-09-20 ENCOUNTER — Ambulatory Visit: Payer: Medicaid Other | Admitting: Neurology

## 2023-09-21 ENCOUNTER — Ambulatory Visit: Payer: Medicaid Other | Admitting: Neurology

## 2023-09-21 ENCOUNTER — Encounter: Payer: Self-pay | Admitting: Neurology

## 2023-09-21 VITALS — BP 106/70 | HR 78 | Ht 73.0 in | Wt 182.5 lb

## 2023-09-21 DIAGNOSIS — G43709 Chronic migraine without aura, not intractable, without status migrainosus: Secondary | ICD-10-CM

## 2023-09-21 DIAGNOSIS — R531 Weakness: Secondary | ICD-10-CM

## 2023-09-21 DIAGNOSIS — G40019 Localization-related (focal) (partial) idiopathic epilepsy and epileptic syndromes with seizures of localized onset, intractable, without status epilepticus: Secondary | ICD-10-CM

## 2023-09-21 DIAGNOSIS — M5412 Radiculopathy, cervical region: Secondary | ICD-10-CM | POA: Diagnosis not present

## 2023-09-21 DIAGNOSIS — Z5181 Encounter for therapeutic drug level monitoring: Secondary | ICD-10-CM

## 2023-09-21 DIAGNOSIS — R200 Anesthesia of skin: Secondary | ICD-10-CM

## 2023-09-21 MED ORDER — BRIVIACT 50 MG PO TABS: 50.0000 mg | ORAL_TABLET | Freq: Two times a day (BID) | ORAL | 0 refills | Status: DC

## 2023-09-21 MED ORDER — VALTOCO 20 MG DOSE 10 MG/0.1ML NA LQPK: NASAL | 0 refills | Status: AC

## 2023-09-21 MED ORDER — NURTEC 75 MG PO TBDP: 75.0000 mg | ORAL_TABLET | ORAL | 11 refills | Status: AC

## 2023-09-21 MED ORDER — CLONAZEPAM 1 MG PO TABS: 1.0000 mg | ORAL_TABLET | Freq: Two times a day (BID) | ORAL | 5 refills | Status: DC

## 2023-09-21 MED ORDER — CENOBAMATE 200 MG PO TABS: 200.0000 mg | ORAL_TABLET | Freq: Every day | ORAL | 5 refills | Status: DC

## 2023-09-21 MED ORDER — DIVALPROEX SODIUM 500 MG PO DR TAB: 1000.0000 mg | DELAYED_RELEASE_TABLET | Freq: Two times a day (BID) | ORAL | 3 refills | Status: DC

## 2023-09-21 NOTE — Progress Notes (Signed)
[facility]  PATIENT: Mario Davis DOB: July 18, 1996  REFERRING CLINICIAN: Janeece Agee, NP HISTORY FROM: Patient and [name]  REASON FOR VISIT: Epilepsy follow up.    HISTORICAL  CHIEF COMPLAINT:  Chief Complaint  Patient presents with   Follow-up    Rm12, fiance present, WU:[id number] 1 was last week,  tension ha: 15/30 days, migraines 4/30 days per month, triggers: light, sound   INTERVAL HISTORY 09/21/2023:  Patient presents today for follow-up, he is accompanied by his fiance.  Last visit was in May, since then we added cenobamate, and increased the Depakote to 1000 mg twice daily.  He is still having seizures, in the past 6 months he had 3 seizures, the last one being 2 weeks ago.  He reports compliant with his medications, denies missing his medications even though taking them together can cause him to feel dizzy and have tremor at times.  He still complaining of headaches, sumatriptan does help.  He did apply for disability but was denied. He is still complaining of right arm and leg weakness and numbness, he could not complete PT because he did have a seizure during one of the session and has not gone back.     INTERVAL HISTORY 03/31/2023:  Mario Davis presents today for follow-up, he is accompanied by girlfriend.  At last visit in February we had planned to send him to the EMU.  He did have the admission, seizures were captured coming from the right temporal region.  He did also have epileptiform discharges in the right temporal region.  Upon discharge Edison Nasuti was added.  Currently he is on Depakote 750 mg twice daily, Vimpat 200 mg twice daily and Xcopri currently at 50 mg daily.  He does report side effect of tremor but has not had any seizures since discharge from the hospital.  Overall he is doing well.  He is still anxious about his seizures and reports compliance with his medications, and good sleep habits. He is also applying for disability as he  is unable to drive   INTERVAL HISTORY 07/29/24:  Patient presents today for follow-up, he is accompanied by her significant other.  He is following up for his seizure.  He continued to have breakthrough seizures despite being on Vimpat 200 mg twice daily and Depakote 750 mg twice daily.  He reports compliance with his medicine but with each of his breakthrough seizure his antiseizure levels are undetectable.  Again patient is adamant that he is taking his medications as prescribed, he has not miss any dose.  He also has missed few follow-up with me.  He feels he is having seizures during his sleep because he will wake up in the morning with sore jaw and tiredness. Girlfriend also reported that at night he will shake during his sleep and become unresponsive. He is also complaining of worsening anxiety.  So far he has tried Levetiracetam, Lamotrigine, Lacosamide and Valproic acid    INTERVAL HISTORY 06/02/2022 Patient presents today for follow-up, last visit was in January.  At that time he was doing well on Lamotrigine 100 mg BID.  Unfortunately in April, he did have a breakthrough seizure.  At that time his lamotrigine level was 1.7.  Patient reports compliance with the medication.  I increase the lamotrigine to 150 mg twice daily.  Since then he has been having worsening headache, complaint of numbness and tingling in the mainly right arm and right leg but no seizures.   INTERVAL HISTORY 11/19/2021:  Patient presents today for follow-up, last visit was on October 3.  At that time plan was to increase Lamictal 200 mg twice daily.  He reported compliance with medication, denies any seizures since last visit, denies any side effect from the medication.  No other concerns or complaints.  Interval History 08/17/2021:  Patient presented for follow-up, last visit was July 28, at that time he was started on lamotrigine 25 mg with plan to uptitrate until 100 mg twice a day.  For some reason, patient is  currently taking 100 mg in the morning and 25 mg at night.  He said that he was not aware that his evening dose was supposed to be 100 mg.  He denies any seizures since last visit, denies any new rash but states that lamotrigine is keeping him up at night.  He takes his evening dose around 8 PM.  No other complaints.  He is currently not working.   HISTORY OF PRESENT ILLNESS:  This is a 27 year old man with no past medical history who is presenting with new onset seizure.  Patient reported 2 weeks ago he had his first lifetime seizure.  Per girlfriend who accompanied patient today she reported patient was asleep and she noted that he was staring at the ceiling, then followed by left face twitching followed by generalized body convulsions.  The entire episode lasted about 2 to 3-minute, after which he was very confused and combative.  He did not remember where he was, he wanted to leave the house and it took him about 30 minutes to regain consciousness.  He was taken to a local ED and admitted for additional work-up.  Initial lab work-up was unrevealing.  He did have a normal MRI of the brain.  He also have a routine EEG which was normal.  He was started on Keppra and discharged home.  Patient stated since starting the Keppra, he has been having headaches and pain behind eyes and he noted also that blood in the stool.  He also reported he was having some mood changes, he became angry for no reason.  He has discontinued the Keppra 4 days ago. He has a family history of seizures mother and uncle had seizures.  He denies any previous history of head trauma no other seizure risk factors noted. Since being discharged only ED he has not had any additional seizure.  He is currently not driving.  She understand that he is not supposed to drive for at least 6 months.  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  He continues to play but has not had any additional incident  Currently he denies any headaches no change in vision no abdominal pain no other complaint.   One additional issue that he brought up is chest pain 4 months ago, described as could not breathe, happened every other night, seen his PMD, had a EKG and told that everything is fine.   REVIEW OF SYSTEMS: Full 14 system review of systems performed and negative with exception of: except as noted in the HPI  ALLERGIES: No Known Allergies  HOME MEDICATIONS: Outpatient Medications Prior to Visit  Medication Sig Dispense Refill   cyclobenzaprine (FLEXERIL) 10 MG tablet Take 1 tablet (10 mg total) by mouth  as needed for up to 10 doses for muscle spasms. 10 tablet 0   ibuprofen (ADVIL) 200 MG tablet Take 200 mg by mouth every 6 (six) hours as needed for moderate pain.     lacosamide (VIMPAT) 200 MG TABS tablet Take 1 tablet (200 mg total) by mouth 2 (two) times daily. 60 tablet 5   SUMAtriptan (IMITREX) 50 MG tablet Take 1 tablet (50 mg total) by mouth every 2 (two) hours as needed for migraine. May repeat in 2 hours if headache persists or recurs. 10 tablet 6   Cenobamate (XCOPRI) 150 MG TABS Take 1 tablet (150 mg total) by mouth daily. 30 tablet 5   clonazePAM (KLONOPIN) 1 MG tablet Take 1 tablet (1 mg total) by mouth 2 (two) times daily. 60 tablet 5   diazePAM, 20 MG Dose, (VALTOCO 20 MG DOSE) 2 x 10 MG/0.1ML LQPK Spray 2 devices, 1 in each nostril as needed for seizures. Do not use again within 5 days of last use. Max of 5 doses per month. 10 each 0   divalproex (DEPAKOTE) 500 MG DR tablet Take 2 tablets (1,000 mg total) by mouth 2 (two) times daily. 120 tablet 6   No facility-administered medications prior to visit.    PAST MEDICAL HISTORY: Past Medical History:  Diagnosis Date   Seizures (HCC)     PAST SURGICAL  HISTORY: History reviewed. No pertinent surgical history.  FAMILY HISTORY: Family History  Problem Relation Age of Onset   Seizures Mother    Hypertension Father     SOCIAL HISTORY: Social History   Socioeconomic History   Marital status: Significant Other    Spouse name: Not on file   Number of children: 0   Years of education: Not on file   Highest education level: Not on file  Occupational History   Not on file  Tobacco Use   Smoking status: Never   Smokeless tobacco: Never  Vaping Use   Vaping status: Never Used  Substance and Sexual Activity   Alcohol use: Yes   Drug use: No   Sexual activity: Not on file  Other Topics Concern   Not on file  Social History Narrative   Lives with girlfriend [name]   Right Handed   Drinks 6-8 cups caffeine daily   Social Determinants of Health   Financial Resource Strain: Not on file  Food Insecurity: No Food Insecurity (02/14/2023)   Hunger Vital Sign    Worried About Running Out of Food in the Last Year: Never true    Ran Out of Food in the Last Year: Never true  Transportation Needs: No Transportation Needs (02/14/2023)   PRAPARE - Administrator, Civil Service (Medical): No    Lack of Transportation (Non-Medical): No  Physical Activity: Not on file  Stress: Not on file  Social Connections: Not on file  Intimate Partner Violence: Not At Risk (02/14/2023)   Humiliation, Afraid, Rape, and Kick questionnaire    Fear of Current or Ex-Partner: No    Emotionally Abused: No    Physically Abused: No    Sexually Abused: No    PHYSICAL EXAM  GENERAL EXAM/CONSTITUTIONAL: Vitals:  Vitals:   09/21/23 0956  BP: 106/70  Pulse: 78  Weight: 182 lb 8 oz (82.8 kg)  Height: 6\' 1"  (1.854 m)    Body mass index is 24.08 kg/m. Wt Readings from Last 3 Encounters:  09/21/23 182 lb 8 oz (82.8 kg)  03/31/23 204 lb (92.5 kg)  01/04/23  199 lb 8 oz (90.5 kg)   Patient is in no distress; well developed, nourished and groomed;  neck is supple   NEUROLOGIC: MENTAL STATUS:      No data to display         awake, alert, oriented to person, place and time recent and remote memory intact normal attention and concentration language fluent, comprehension intact, naming intact fund of knowledge appropriate  CRANIAL NERVE:  2nd, 3rd, 4th, 6th -visual fields full to confrontation, extraocular muscles intact, no nystagmus 5th - facial sensation symmetric 7th - facial strength symmetric 8th - hearing intact 9th - palate elevates symmetrically, uvula midline 11th - shoulder shrug symmetric 12th - tongue protrusion midline   MOTOR:   4/5 weakness in the right upper and lower extremity when compared to he left   He does have weak hand grip on the right 4/5  Reflexes are diminished on the right brachioradialis and patellar 2+ when they are brisk on the left 3+   GAIT/STATION:  Normal    DIAGNOSTIC DATA (LABS, IMAGING, TESTING) - I reviewed patient records, labs, notes, testing and imaging myself where available.  Lab Results  Component Value Date   WBC 6.1 02/14/2023   HGB 14.4 02/14/2023   HCT 40.6 02/14/2023   MCV 89.4 02/14/2023   PLT 280 02/14/2023      Component Value Date/Time   NA 138 02/14/2023 1036   NA 139 01/04/2023 1137   K 3.1 (L) 02/14/2023 1036   CL 105 02/14/2023 1036   CO2 24 02/14/2023 1036   GLUCOSE 129 (H) 02/14/2023 1036   BUN 16 02/14/2023 1036   BUN 16 01/04/2023 1137   CREATININE 1.15 02/14/2023 1036   CALCIUM 8.8 (L) 02/14/2023 1036   PROT 6.9 02/14/2023 1036   PROT 7.5 01/04/2023 1137   ALBUMIN 3.7 02/14/2023 1036   ALBUMIN 4.5 01/04/2023 1137   AST 29 02/14/2023 1036   ALT 28 02/14/2023 1036   ALKPHOS 68 02/14/2023 1036   BILITOT 0.9 02/14/2023 1036   BILITOT 0.2 01/04/2023 1137   GFRNONAA >60 02/14/2023 1036   GFRAA >60 11/21/2017 2155   No results found for: "CHOL", "HDL", "LDLCALC", "LDLDIRECT", "TRIG", "CHOLHDL" Lab Results  Component Value Date    HGBA1C 5.6 02/15/2023   Lab Results  Component Value Date   VITAMINB12 338 07/13/2022   Lab Results  Component Value Date   TSH 0.368 05/27/2021    MRI Brain: Reviewed, within normal limits   Routine EEG: Normal routine EEG  EMU 02/16/23:  ABNORMALITY - Spike, right anterior temporal region -  Focal seizure, right temporal region   IMPRESSION: This study showed two focal seizures as described above on 02/15/2023 at 2135 and on 02/16/2023 at 0239, lasting about 2 and 3 minutes respectively. Additionally there is epileptogenicity in right anterior temporal region.   ASSESSMENT AND PLAN  27 y.o. year old male here for follow-up for his intractable epilepsy and headaches.   For his epilepsy, currently he is on cenobamate 150 mg daily, Depakote 1000 mg twice daily and Vimpat 200 mg twice daily but does report side effect of tremor and dizziness.  Plan will be to increase the Cenobamate to 200 mg daily and switch the Vimpat to Briviact and continue patient on Depakote 1000 mg twice daily.  I will also refer him for VNS therapy. For his headaches, reports that sumatriptan does help, he is on Depakote as preventive medication, will add Nurtec 1 tablet every other day for the  migraines.  He can continue to take the sumatriptan as needed for severe headaches For his right-sided weakness and numbness, he was referred to PT but did have a seizure during one of the session therefore has not returned.  On exam he was noted to have mild weakness in the right upper and lower extremity when compared to the right and differences in reflexes.  I will obtain a cervical spine MRI and send patient back to PT.  I will contact him to go over the result.  Advised him to contact me if he does have any additional seizures or if the Briviact was not approved by insurance.  I will see him in 6 months for follow-up or sooner if worse.   1. Partial idiopathic epilepsy with seizures of localized onset, intractable,  without status epilepticus (HCC)   2. Therapeutic drug monitoring   3. Cervical radiculopathy   4. Numbness   5. Right sided weakness       Patient Instructions  Increase Cenobamate to 200 mg daily Continue with Depakote 1000 mg twice daily For now continue with Vimpat 200 mg twice daily, we will plan to switch the Vimpat to Briviact, just waiting for insurance to approve the Briviact Nurtec every other day for headaches prevention Refer to physical therapy for neck pain and right-sided weakness MRI cervical spine to rule out cervical radiculopathy/myelopathy, he does have asymmetry in reflexes and right-sided weakness Will do labs today Referral to VNS therapy Follow-up in 6 months or sooner if worse. Disability letter given to patient   --------------------------------------------   Per [facility] statutes, patients with seizures are not allowed to drive until they have been seizure-free for six months.  Other recommendations include using caution when using heavy equipment or power tools. Avoid working on ladders or at heights. Take showers instead of baths.  Do not swim alone.  Ensure the water temperature is not too high on the home water heater. Do not go swimming alone. Do not lock yourself in a room alone (i.e. bathroom). When caring for infants or small children, sit down when holding, feeding, or changing them to minimize risk of injury to the child in the event you have a seizure. Maintain good sleep hygiene. Avoid alcohol.  Also recommend adequate sleep, hydration, good diet and minimize stress.   During the Seizure  - First, ensure adequate ventilation and place patients on the floor on their left side  Loosen clothing around the neck and ensure the airway is patent. If the patient is clenching the teeth, do not force the mouth open with any object as this can cause severe damage - Remove all items from the surrounding that can be hazardous. The patient may be  oblivious to what's happening and may not even know what he or she is doing. If the patient is confused and wandering, either gently guide him/her away and block access to outside areas - Reassure the individual and be comforting - Call 911. In most cases, the seizure ends before EMS arrives. However, there are cases when seizures may last over 3 to 5 minutes. Or the individual may have developed breathing difficulties or severe injuries. If a pregnant patient or a person with diabetes develops a seizure, it is prudent to call an ambulance. - Finally, if the patient does not regain full consciousness, then call EMS. Most patients will remain confused for about 45 to 90 minutes after a seizure, so you must use judgment in calling for help. -  Avoid restraints but make sure the patient is in a bed with padded side rails - Place the individual in a lateral position with the neck slightly flexed; this will help the saliva drain from the mouth and prevent the tongue from falling backward - Remove all nearby furniture and other hazards from the area - Provide verbal assurance as the individual is regaining consciousness - Provide the patient with privacy if possible - Call for help and start treatment as ordered by the caregiver   After the Seizure (Postictal Stage)  After a seizure, most patients experience confusion, fatigue, muscle pain and/or a headache. Thus, one should permit the individual to sleep. For the next few days, reassurance is essential. Being calm and helping reorient the person is also of importance.  Most seizures are painless and end spontaneously. Seizures are not harmful to others but can lead to complications such as stress on the lungs, brain and the heart. Individuals with prior lung problems may develop labored breathing and respiratory distress.     Orders Placed This Encounter  Procedures   MR CERVICAL SPINE WO CONTRAST   CMP   Valproic Acid Level   Lacosamide    Ambulatory referral to Physical Therapy     Meds ordered this encounter  Medications   diazePAM, 20 MG Dose, (VALTOCO 20 MG DOSE) 2 x 10 MG/0.1ML LQPK    Sig: Spray 2 devices, 1 in each nostril as needed for seizures. Do not use again within 5 days of last use. Max of 5 doses per month.    Dispense:  10 each    Refill:  0    Dispense 5 boxes for a total of 10 doses   clonazePAM (KLONOPIN) 1 MG tablet    Sig: Take 1 tablet (1 mg total) by mouth 2 (two) times daily.    Dispense:  60 tablet    Refill:  5   divalproex (DEPAKOTE) 500 MG DR tablet    Sig: Take 2 tablets (1,000 mg total) by mouth 2 (two) times daily.    Dispense:  360 tablet    Refill:  3   Cenobamate 200 MG TABS    Sig: Take 200 mg by mouth daily.    Dispense:  30 tablet    Refill:  5   Brivaracetam (BRIVIACT) 50 MG TABS    Sig: Take 50 mg by mouth 2 (two) times daily.    Dispense:  60 tablet    Refill:  0   Rimegepant Sulfate (NURTEC) 75 MG TBDP    Sig: Take 1 tablet (75 mg total) by mouth every other day.    Dispense:  16 tablet    Refill:  11     Return in about 6 months (around 03/20/2024).   Windell Norfolk, MD 09/21/2023, 11:07 AM  [facility] [address] [place], [place] [address] [phone]

## 2023-09-21 NOTE — Patient Instructions (Addendum)
Increase Cenobamate to 200 mg daily Continue with Depakote 1000 mg twice daily For now continue with Vimpat 200 mg twice daily, we will plan to switch the Vimpat to Briviact, just waiting for insurance to approve the Briviact Nurtec every other day for headaches prevention Refer to physical therapy for neck pain and right-sided weakness MRI cervical spine to rule out cervical radiculopathy/myelopathy, he does have asymmetry in reflexes and right-sided weakness Will do labs today Referral to VNS therapy Follow-up in 6 months or sooner if worse. Disability letter given to patient

## 2023-09-22 ENCOUNTER — Telehealth: Payer: Self-pay | Admitting: Neurology

## 2023-09-22 NOTE — Telephone Encounter (Signed)
[facility]: [id number] exp. 09/22/23-11/21/23 sent to GI [phone]

## 2023-09-24 LAB — COMPREHENSIVE METABOLIC PANEL
ALT: 38 [IU]/L (ref 0–44)
AST: 28 [IU]/L (ref 0–40)
Albumin: 4.7 g/dL (ref 4.3–5.2)
Alkaline Phosphatase: 111 [IU]/L (ref 44–121)
BUN/Creatinine Ratio: 15 (ref 9–20)
BUN: 14 mg/dL (ref 6–20)
Bilirubin Total: 0.3 mg/dL (ref 0.0–1.2)
CO2: 23 mmol/L (ref 20–29)
Calcium: 9.9 mg/dL (ref 8.7–10.2)
Chloride: 102 mmol/L (ref 96–106)
Creatinine, Ser: 0.95 mg/dL (ref 0.76–1.27)
Globulin, Total: 3.2 g/dL (ref 1.5–4.5)
Glucose: 90 mg/dL (ref 70–99)
Potassium: 4.1 mmol/L (ref 3.5–5.2)
Sodium: 140 mmol/L (ref 134–144)
Total Protein: 7.9 g/dL (ref 6.0–8.5)
eGFR: 114 mL/min/{1.73_m2} (ref >59)

## 2023-09-24 LAB — VALPROIC ACID LEVEL: Valproic Acid Lvl: 59 ug/mL (ref 50–100)

## 2023-09-24 LAB — LACOSAMIDE: Lacosamide: 13.8 ug/mL — ABNORMAL HIGH (ref 5.0–10.0)

## 2023-09-26 ENCOUNTER — Encounter: Payer: Self-pay | Admitting: Neurology

## 2023-09-30 ENCOUNTER — Other Ambulatory Visit (HOSPITAL_COMMUNITY): Payer: Self-pay

## 2023-09-30 ENCOUNTER — Telehealth: Payer: Self-pay

## 2023-09-30 NOTE — Telephone Encounter (Signed)
Pharmacy Patient Advocate Encounter   Received notification from RX Request Messages that prior authorization for Nurtec 75MG  dispersible tablets is required/requested.   Insurance verification completed.   The patient is insured through [facility] [place] .   Per test claim: PA required; PA submitted to above mentioned insurance via CoverMyMeds Key/confirmation #/EOC [id number] Status is pending

## 2023-10-03 ENCOUNTER — Ambulatory Visit: Payer: Medicaid Other | Attending: Neurology

## 2023-10-03 DIAGNOSIS — M6281 Muscle weakness (generalized): Secondary | ICD-10-CM | POA: Insufficient documentation

## 2023-10-03 DIAGNOSIS — R293 Abnormal posture: Secondary | ICD-10-CM | POA: Insufficient documentation

## 2023-10-03 DIAGNOSIS — M5412 Radiculopathy, cervical region: Secondary | ICD-10-CM | POA: Insufficient documentation

## 2023-10-03 DIAGNOSIS — R200 Anesthesia of skin: Secondary | ICD-10-CM | POA: Diagnosis not present

## 2023-10-03 DIAGNOSIS — R531 Weakness: Secondary | ICD-10-CM | POA: Insufficient documentation

## 2023-10-03 DIAGNOSIS — M542 Cervicalgia: Secondary | ICD-10-CM | POA: Diagnosis present

## 2023-10-03 NOTE — Therapy (Signed)
OUTPATIENT PHYSICAL THERAPY NEURO EVALUATION   Patient Name: Mario Davis MRN: [id number] DOB:Nov 22, 1995, 27 y.o., male Today's Date: 10/03/2023   PCP: Janeece Agee, NP REFERRING PROVIDER: Windell Norfolk, MD  END OF SESSION:  PT End of Session - 10/03/23 0956     Visit Number 1    Number of Visits 7    Date for PT Re-Evaluation 12/02/23    Authorization Type [place] medicaid prepaid    PT Start Time 1015    PT Stop Time 1102    PT Time Calculation (min) 47 min    Activity Tolerance Patient limited by pain    Behavior During Therapy [facility] for tasks assessed/performed             Past Medical History:  Diagnosis Date   Seizures (HCC)    No past surgical history on file. Patient Active Problem List   Diagnosis Date Noted   Seizure (HCC) 05/26/2021   Abdominal pain in male 04/14/2015    ONSET DATE:   09/21/2023   referral  REFERRING DIAG:  M54.12 (ICD-10-CM) - Cervical radiculopathy  R20.0 (ICD-10-CM) - Numbness  R53.1 (ICD-10-CM) - Right sided weakness    THERAPY DIAG:  Cervicalgia - Plan: PT plan of care cert/re-cert  Abnormal posture - Plan: PT plan of care cert/re-cert  Muscle weakness (generalized) - Plan: PT plan of care cert/re-cert  Rationale for Evaluation and Treatment: Rehabilitation  SUBJECTIVE:                                                                                                                                                                                             SUBJECTIVE STATEMENT: Patient arrives to clinic alone, but someone else drove him. Last had a seizure last week. Does report some warning days before with a lot of anxiety and chest pain. Does have absent seizures that last ~10 mins and some tonic/clonic seizures. Still with R sided neck pain/weakness.  Pt accompanied by: self  PERTINENT HISTORY: seizures  PAIN:  Are you having pain? Yes: NPRS scale: 6/10 Pain location: R sided neck pain, posterior   Pain description: does note that sometimes pain is pre-ictal, stabbing Aggravating factors: "it's just there"  Relieving factors: none noted  PRECAUTIONS: Fall and Other: seizures  RED FLAGS: Bowel or bladder incontinence: No   WEIGHT BEARING RESTRICTIONS: No  FALLS: Has patient fallen in last 6 months? Yes. Number of falls 1; out of his gaming chair when he had a seizure  LIVING ENVIRONMENT: Lives with: lives with their spouse Lives in: House/apartment Stairs: No Has following equipment at home: None  PLOF: Independent  PATIENT GOALS: "to strengthen my hand and my neck pain"  OBJECTIVE:  Note: Objective measures were completed at Evaluation unless otherwise noted.  DIAGNOSTIC FINDINGS: MRI c-spine ordered and scheduled for 12/2  COGNITION: Overall cognitive status: Within functional limits for tasks assessed   SENSATION: Light touch: N/T in R hand and entirety of R LE  COORDINATION: Slight dysmetria R LE with figure 8, WFL heel/shin  Slight intention tremor R UE at full extension finger/nose  RAM normal UE, LE  Grip strength  R UE: average 6#  L UE: average ~50#  POSTURE: rounded shoulders, forward head, increased thoracic kyphosis, posterior pelvic tilt, flexed trunk , and weight shift right   LOWER EXTREMITY MMT:    MMT Right Eval Left Eval  Hip flexion 4- 5  Hip extension    Hip abduction 4+ 5  Hip adduction 4+ 5  Hip internal rotation    Hip external rotation    Knee flexion 4- 5  Knee extension 4- 5  Ankle dorsiflexion 3 5  Ankle plantarflexion    Ankle inversion    Ankle eversion    (Blank rows = not tested)  CERVICAL SPECIAL TESTS:  Upper limb tension test (ULTT): unable to test due to poor pain tolerance, Spurling's test: unable to test due to patient pain tolerance, Distraction test: unable to test due to patient pain tolerance, and Sharp pursor's test: increase in pain    BED MOBILITY:  Is impacted by pain and sometimes needs  help   PATIENT SURVEYS:  NDI 74%  TODAY'S TREATMENT:                                                                                                                              N/a eval   PATIENT EDUCATION: Education details: PT POC, exam findings, PT role Person educated: Patient Education method: Explanation Education comprehension: verbalized understanding and needs further education  HOME EXERCISE PROGRAM: From previous bout of PT  Access Code: [email or web address] URL: [email or web address] Date: 08/18/2022 Prepared by: Camille Bal   Exercises - Seated Upper Trapezius Stretch  - 1 x daily - 7 x weekly - 3 sets - 10 reps - Seated Levator Scapulae Stretch  - 1 x daily - 7 x weekly - 3 sets - 10 reps - Sternocleidomastoid Stretch  - 1 x daily - 7 x weekly - 3 sets - 10 reps - Chin Tuck Against Resistance with Towel  - 1 x daily - 7 x weekly - 3 sets - 10 reps - Seated Cervical Retraction  - 1 x daily - 7 x weekly - 3 sets - 10 reps - Radial Nerve Flossing  - 1 x daily - 7 x weekly - 2 sets - 10 reps - Seated Pronation Supination with Dumbbell  - 1 x daily - 7 x weekly - 3 sets - 10 reps - Mid-Lower Cervical Extension SNAG with Strap  - 1 x daily - 7 x weekly -  2 sets - 10 reps - Putty Squeezes  - 1 x daily - 7 x weekly - 3 sets - 10 reps - 3-Point Pinch with Putty  - 1 x daily - 7 x weekly - 3 sets - 10 reps - Seated Finger Abduction with Putty  - 1 x daily - 7 x weekly - 3 sets - 10 reps  GOALS: Goals reviewed with patient? Yes  SHORT TERM GOALS: Target date: 11/04/23  Pt will be independent with initial HEP for improved functional strength   Baseline: to be provided Goal status: INITIAL  2.  Patient will improve R UE grip strength to >/= 12lbs to demonstrate improved functional strength  Baseline: 6lbs Goal status: INITIAL  3.  Patient will improve NDI to </= 30/50 to demonstrate reduced level of disability related to neck pain Baseline: 37/50 Goal  status: INITIAL    LONG TERM GOALS: Target date: 12/02/23  Pt will be independent with final HEP for improved functional strength and pain reduction  Baseline: to be reviewed Goal status: INITIAL  2.  Patient will improve R UE grip strength to >/= 24lbs to demonstrate improved functionality  Baseline: 6lbs Goal status: INITIAL  3.  Patient will improve NDI to </= 23/50 to demonstrate reduced level of disability related to neck pain Baseline: 37/50 Goal status: INITIAL    ASSESSMENT:  CLINICAL IMPRESSION: Patient is a 27 y.o. male who was seen today for physical therapy evaluation and treatment for neck pain and R hemiparesis. He is known to this clinic where he previously had a bout of PT for cervical radiculopathy. On exam today, his grip strength is substantially weaker bilaterally: 6lbs on the R (norm: 120lbs) and 50lbs on the L (norm: 110lbs). He also has marked R LE weakness. On observation- patient with R lateral lean at the trunk, L lateral cervical sidebend, palpable fluid mass at R posterolateral proximal aspect of c-spine (no redness or warmth). He also has significant allodynia and is unable to tolerate light touch and so many c-spine special tests were deferred on eval. He reports whole R UE and LE numbness as opposed to numbness that follows a dermatomal pattern, which would be more consistent with radiculopathy findings. He continues to have recurrent seizures that he describes as either grand mal or absent. He denies or is not aware of any injuries sustained during a seizure. Patient would benefit from a trial of PT once MRI of c-spine is resulted to rule out contraindications.   OBJECTIVE IMPAIRMENTS: decreased coordination, decreased knowledge of condition, decreased mobility, decreased ROM, decreased strength, increased edema, impaired sensation, impaired UE functional use, improper body mechanics, postural dysfunction, and pain.   ACTIVITY LIMITATIONS: carrying, lifting,  sleeping, bed mobility, locomotion level, and caring for others  PARTICIPATION LIMITATIONS: meal prep, cleaning, interpersonal relationship, driving, shopping, community activity, occupation, and yard work  PERSONAL FACTORS: Age, Past/current experiences, Profession, Transportation, and 1 comorbidity: seizures  are also affecting patient's functional outcome.   REHAB POTENTIAL: Fair unknown etiology, time since onset  CLINICAL DECISION MAKING: Unstable/unpredictable  EVALUATION COMPLEXITY: High  PLAN:  PT FREQUENCY: 1x/week  PT DURATION: 6 weeks  PLANNED INTERVENTIONS: 97164- PT Re-evaluation, 97110-Therapeutic exercises, 97530- Therapeutic activity, 97112- Neuromuscular re-education, 97535- Self Care, 16109- Manual therapy, [phone]- Gait training, [phone]- Orthotic Fit/training, [phone]- Canalith repositioning, [id number]- Aquatic Therapy, [id number]- Ultrasound, [id number]- Traction (mechanical), [id number]- Ionotophoresis 4mg /ml Dexamethasone, Patient/Family education, Balance training, Stair training, Taping, Dry Needling, Joint mobilization, Spinal manipulation, Spinal mobilization, Manual lymph drainage, Vestibular training, DME  instructions, Cryotherapy, and Moist heat  PLAN FOR NEXT SESSION: MRI resulted? Review HEP, assess L-spine?   Westley Foots, PT Westley Foots, PT, DPT, CBIS  10/03/2023, 11:40 AM    For all possible CPT codes, reference the Planned Interventions line above.     Check all conditions that are expected to impact treatment: {Conditions expected to impact treatment:Musculoskeletal disorders, Structural or anatomic abnormalities, and Neurological condition and/or seizures   If treatment provided at initial evaluation, no treatment charged due to lack of authorization.

## 2023-10-05 NOTE — Telephone Encounter (Signed)
Pharmacy Patient Advocate Encounter  Received notification from [facility] Medicaid that Prior Authorization for Nurtec 75MG  dispersible tablets has been DENIED.  Full denial letter will be uploaded to the media tab. See denial reason below.  The requested drug is not approved by the FDA for the treatment of TENSION-TYPE HEADACHE UNS NOT INTRACTABLE in members 25 years of age or older.  PA #/Case ID/Reference #: [id number]

## 2023-10-07 ENCOUNTER — Other Ambulatory Visit: Payer: Self-pay | Admitting: Neurology

## 2023-10-07 MED ORDER — AMITRIPTYLINE HCL 25 MG PO TABS: 25.0000 mg | ORAL_TABLET | Freq: Every day | ORAL | 3 refills | Status: DC

## 2023-10-07 NOTE — Addendum Note (Signed)
Addended byWindell Norfolk on: 10/07/2023 03:33 PM   Modules accepted: Orders

## 2023-10-07 NOTE — Telephone Encounter (Signed)
Corrected diagnosis: Chronic Migraines and not chronic tension type headaches.

## 2023-10-10 ENCOUNTER — Other Ambulatory Visit (HOSPITAL_COMMUNITY): Payer: Self-pay

## 2023-10-10 NOTE — Telephone Encounter (Signed)
    Unable to resubmit with insurance.

## 2023-10-11 NOTE — Telephone Encounter (Signed)
Will forward to our [employer].

## 2023-10-12 NOTE — Telephone Encounter (Signed)
Insurance is asking the following:   Please advise.  Thank you!

## 2023-10-12 NOTE — Telephone Encounter (Signed)
Appeal has been submitted for Nurtec. Will advise when response is received or follow up in 1 week. The appeal letter and all pertinent information was sent via fax to [phone] on 10/12/2023 @ 12:51 pm.  Dellie Burns, PharmD Clinical Pharmacist   Direct Dial: [phone]

## 2023-10-17 ENCOUNTER — Other Ambulatory Visit (HOSPITAL_COMMUNITY): Payer: Self-pay

## 2023-10-17 ENCOUNTER — Ambulatory Visit
Admission: RE | Admit: 2023-10-17 | Discharge: 2023-10-17 | Disposition: A | Discharge status: Home / Self Care | Payer: Medicaid Other | Source: Ambulatory Visit | Attending: Neurology | Admitting: Neurology

## 2023-10-17 DIAGNOSIS — M5412 Radiculopathy, cervical region: Secondary | ICD-10-CM | POA: Diagnosis not present

## 2023-10-17 NOTE — Telephone Encounter (Signed)
Pharmacy Patient Advocate Encounter  Received notification from [facility] Medicaid that the appeal submitted for Nurtec 75mg  tablets has been approved.     PA #/Case ID/Reference #: [id number]

## 2023-10-18 ENCOUNTER — Ambulatory Visit: Payer: Medicaid Other | Attending: Neurology

## 2023-10-18 DIAGNOSIS — R293 Abnormal posture: Secondary | ICD-10-CM | POA: Diagnosis present

## 2023-10-18 DIAGNOSIS — R252 Cramp and spasm: Secondary | ICD-10-CM | POA: Insufficient documentation

## 2023-10-18 DIAGNOSIS — M5412 Radiculopathy, cervical region: Secondary | ICD-10-CM | POA: Insufficient documentation

## 2023-10-18 DIAGNOSIS — M542 Cervicalgia: Secondary | ICD-10-CM | POA: Insufficient documentation

## 2023-10-18 DIAGNOSIS — M6281 Muscle weakness (generalized): Secondary | ICD-10-CM | POA: Diagnosis present

## 2023-10-18 NOTE — Therapy (Addendum)
OUTPATIENT PHYSICAL THERAPY NEURO TREATMENT   Patient Name: Mario Davis MRN: [id number] DOB:07/05/1996, 27 y.o., male Today's Date: 10/18/2023   PCP: Janeece Agee, NP REFERRING PROVIDER: Windell Norfolk, MD  END OF SESSION:  PT End of Session - 10/18/23 1012     Visit Number 2    Number of Visits 7    Date for PT Re-Evaluation 12/02/23    Authorization Type [place] medicaid prepaid    PT Start Time 1012    PT Stop Time 1058    PT Time Calculation (min) 46 min    Activity Tolerance Patient limited by pain    Behavior During Therapy Flat affect             Past Medical History:  Diagnosis Date   Seizures (HCC)    History reviewed. No pertinent surgical history. Patient Active Problem List   Diagnosis Date Noted   Seizure (HCC) 05/26/2021   Abdominal pain in male 04/14/2015    ONSET DATE:   09/21/2023   referral  REFERRING DIAG:  M54.12 (ICD-10-CM) - Cervical radiculopathy  R20.0 (ICD-10-CM) - Numbness  R53.1 (ICD-10-CM) - Right sided weakness    THERAPY DIAG:  Cervicalgia  Cramp and spasm  Abnormal posture  Muscle weakness (generalized)  Radiculopathy, cervical region  Rationale for Evaluation and Treatment: Rehabilitation  SUBJECTIVE:                                                                                                                                                                                             SUBJECTIVE STATEMENT: Patient arrived to clinic alone. Last seizure was Saturday morning while he was sleeping and was home alone. Neck pain still constant every day 6/10.   Pt accompanied by: self  PERTINENT HISTORY: seizures  PAIN:  Are you having pain? Yes: NPRS scale: 6/10 Pain location: R sided neck pain, posterior  Pain description: does note that sometimes pain is pre-ictal, stabbing Aggravating factors: "it's just there"  Relieving factors: none noted  PRECAUTIONS: Fall and Other: seizures  RED  FLAGS: Bowel or bladder incontinence: No   WEIGHT BEARING RESTRICTIONS: No  FALLS: Has patient fallen in last 6 months? Yes. Number of falls 1; out of his gaming chair when he had a seizure  LIVING ENVIRONMENT: Lives with: lives with their spouse Lives in: House/apartment Stairs: No Has following equipment at home: None  PLOF: Independent  PATIENT GOALS: "to strengthen my hand and my neck pain"  OBJECTIVE:  Note: Objective measures were completed at Evaluation unless otherwise noted.  DIAGNOSTIC FINDINGS: MRI c-spine ordered and scheduled for 12/2  COGNITION: Overall cognitive  status: Within functional limits for tasks assessed   SENSATION: Light touch: N/T in R hand and entirety of R LE  COORDINATION: Slight dysmetria R LE with figure 8, WFL heel/shin  Slight intention tremor R UE at full extension finger/nose  RAM normal UE, LE  Grip strength  R UE: average 6#  L UE: average ~50#  POSTURE: rounded shoulders, forward head, increased thoracic kyphosis, posterior pelvic tilt, flexed trunk , and weight shift right   LOWER EXTREMITY MMT:    MMT Right Eval Left Eval  Hip flexion 4- 5  Hip extension    Hip abduction 4+ 5  Hip adduction 4+ 5  Hip internal rotation    Hip external rotation    Knee flexion 4- 5  Knee extension 4- 5  Ankle dorsiflexion 3 5  Ankle plantarflexion    Ankle inversion    Ankle eversion    (Blank rows = not tested)  CERVICAL SPECIAL TESTS:  Upper limb tension test (ULTT): unable to test due to poor pain tolerance, Spurling's test: unable to test due to patient pain tolerance, Distraction test: unable to test due to patient pain tolerance, and Sharp pursor's test: increase in pain    BED MOBILITY:  Is impacted by pain and sometimes needs help   PATIENT SURVEYS:  NDI 74%  TODAY'S TREATMENT:                                                                                                                               Attempted  manual therapy but patient was extremely sensitive to light touch along his neck   Education on potential dry needling for neck pain and use of heating pad and stretching in the home    PATIENT EDUCATION: Education details: PT prognosis  Person educated: Patient Education method: Explanation Education comprehension: verbalized understanding and needs further education  HOME EXERCISE PROGRAM: From previous bout of PT  Access Code: [email or web address] URL: [email or web address] Date: 08/18/2022 Prepared by: Camille Bal   Exercises - Seated Upper Trapezius Stretch  - 1 x daily - 7 x weekly - 3 sets - 10 reps - Seated Levator Scapulae Stretch  - 1 x daily - 7 x weekly - 3 sets - 10 reps - Sternocleidomastoid Stretch  - 1 x daily - 7 x weekly - 3 sets - 10 reps - Chin Tuck Against Resistance with Towel  - 1 x daily - 7 x weekly - 3 sets - 10 reps - Seated Cervical Retraction  - 1 x daily - 7 x weekly - 3 sets - 10 reps - Radial Nerve Flossing  - 1 x daily - 7 x weekly - 2 sets - 10 reps - Seated Pronation Supination with Dumbbell  - 1 x daily - 7 x weekly - 3 sets - 10 reps - Mid-Lower Cervical Extension SNAG with Strap  - 1 x daily - 7 x weekly - 2 sets - 10 reps -  Putty Squeezes  - 1 x daily - 7 x weekly - 3 sets - 10 reps - 3-Point Pinch with Putty  - 1 x daily - 7 x weekly - 3 sets - 10 reps - Seated Finger Abduction with Putty  - 1 x daily - 7 x weekly - 3 sets - 10 reps  GOALS: Goals reviewed with patient? Yes  SHORT TERM GOALS: Target date: 11/04/23  Pt will be independent with initial HEP for improved functional strength   Baseline: to be provided Goal status: INITIAL  2.  Patient will improve R UE grip strength to >/= 12lbs to demonstrate improved functional strength  Baseline: 6lbs Goal status: INITIAL  3.  Patient will improve NDI to </= 30/50 to demonstrate reduced level of disability related to neck pain Baseline: 37/50 Goal status: INITIAL    LONG  TERM GOALS: Target date: 12/02/23  Pt will be independent with final HEP for improved functional strength and pain reduction  Baseline: to be reviewed Goal status: INITIAL  2.  Patient will improve R UE grip strength to >/= 24lbs to demonstrate improved functionality  Baseline: 6lbs Goal status: INITIAL  3.  Patient will improve NDI to </= 23/50 to demonstrate reduced level of disability related to neck pain Baseline: 37/50 Goal status: INITIAL    ASSESSMENT:  CLINICAL IMPRESSION: Patient seen for skilled PT session with emphasis on attempting cervical ROM, strengthening, stretching, desensitization, etc. Unable to complete any of the above due to significant pain to light touch around cervical paraspinals. Patient does demonstrate reasonable AROM automatically (lateral flexion in attempts to manipulate neck(?)). C-spine MRI does not explain patients profoundly limited R UE strength. No recent lumbar imaging to offer explanation for R LE weakness. Reflexes WNL, no clonus, no Hoffman's. This PT questions muscle strain in c-spine related to his seizures and possible lumbar radiculopathy (?). We will attempt dry needling at next session and assess for positive response. Otherwise, his progress with PT will be guarded and he will likely benefit from continued medical work up. Continue POC as able.   OBJECTIVE IMPAIRMENTS: decreased coordination, decreased knowledge of condition, decreased mobility, decreased ROM, decreased strength, increased edema, impaired sensation, impaired UE functional use, improper body mechanics, postural dysfunction, and pain.   ACTIVITY LIMITATIONS: carrying, lifting, sleeping, bed mobility, locomotion level, and caring for others  PARTICIPATION LIMITATIONS: meal prep, cleaning, interpersonal relationship, driving, shopping, community activity, occupation, and yard work  PERSONAL FACTORS: Age, Past/current experiences, Profession, Transportation, and 1 comorbidity:  seizures  are also affecting patient's functional outcome.   REHAB POTENTIAL: Fair unknown etiology, time since onset  CLINICAL DECISION MAKING: Unstable/unpredictable  EVALUATION COMPLEXITY: High  PLAN:  PT FREQUENCY: 1x/week  PT DURATION: 6 weeks  PLANNED INTERVENTIONS: 97164- PT Re-evaluation, 97110-Therapeutic exercises, 97530- Therapeutic activity, [id number]- Neuromuscular re-education, 97535- Self Care, 16109- Manual therapy, [phone]- Gait training, [phone]- Orthotic Fit/training, [phone]- Canalith repositioning, [id number]- Aquatic Therapy, [phone]- Ultrasound, [id number]- Traction (mechanical), [phone]- Ionotophoresis 4mg /ml Dexamethasone, Patient/Family education, Balance training, Stair training, Taping, Dry Needling, Joint mobilization, Spinal manipulation, Spinal mobilization, Manual lymph drainage, Vestibular training, DME instructions, Cryotherapy, and Moist heat  PLAN FOR NEXT SESSION:  Review HEP, assess L-spine?, dry needling     Michaiah Maiden M Amairani Shuey, SPT  Westley Foots, PT, DPT, CBIS  10/18/2023, 12:13 PM    For all possible CPT codes, reference the Planned Interventions line above.     Check all conditions that are expected to impact treatment: {Conditions expected to impact treatment:Musculoskeletal disorders, Structural or  anatomic abnormalities, and Neurological condition and/or seizures   If treatment provided at initial evaluation, no treatment charged due to lack of authorization.

## 2023-10-20 ENCOUNTER — Other Ambulatory Visit: Payer: Self-pay

## 2023-10-20 ENCOUNTER — Other Ambulatory Visit: Payer: Self-pay | Admitting: Neurology

## 2023-10-20 MED ORDER — BRIVIACT 50 MG PO TABS: 1.0000 | ORAL_TABLET | Freq: Two times a day (BID) | ORAL | 5 refills | Status: DC

## 2023-10-20 NOTE — Progress Notes (Signed)
Done. Thanks.

## 2023-10-25 ENCOUNTER — Ambulatory Visit: Payer: Medicaid Other

## 2023-10-25 ENCOUNTER — Encounter: Payer: Self-pay | Admitting: Neurology

## 2023-10-25 DIAGNOSIS — M6281 Muscle weakness (generalized): Secondary | ICD-10-CM

## 2023-10-25 DIAGNOSIS — M542 Cervicalgia: Secondary | ICD-10-CM

## 2023-10-25 DIAGNOSIS — R252 Cramp and spasm: Secondary | ICD-10-CM

## 2023-10-25 DIAGNOSIS — M5412 Radiculopathy, cervical region: Secondary | ICD-10-CM

## 2023-10-25 NOTE — Therapy (Signed)
OUTPATIENT PHYSICAL THERAPY NEURO TREATMENT   Patient Name: Mario Davis MRN: [id number] DOB:1996-08-19, 27 y.o., male Today's Date: 10/25/2023   PCP: Janeece Agee, NP REFERRING PROVIDER: Windell Norfolk, MD  END OF SESSION:  PT End of Session - 10/25/23 1016     Visit Number 3    Number of Visits 7    Date for PT Re-Evaluation 12/02/23    Authorization Type [place] medicaid prepaid    PT Start Time 1015    PT Stop Time 1100    PT Time Calculation (min) 45 min    Equipment Utilized During Treatment Gait belt    Activity Tolerance Patient limited by pain;Patient tolerated treatment well    Behavior During Therapy Flat affect;WFL for tasks assessed/performed             Past Medical History:  Diagnosis Date   Seizures (HCC)    No past surgical history on file. Patient Active Problem List   Diagnosis Date Noted   Seizure (HCC) 05/26/2021   Abdominal pain in male 04/14/2015    ONSET DATE:   09/21/2023   referral  REFERRING DIAG:  M54.12 (ICD-10-CM) - Cervical radiculopathy  R20.0 (ICD-10-CM) - Numbness  R53.1 (ICD-10-CM) - Right sided weakness    THERAPY DIAG:  Cervicalgia  Cramp and spasm  Muscle weakness (generalized)  Radiculopathy, cervical region  Rationale for Evaluation and Treatment: Rehabilitation  SUBJECTIVE:                                                                                                                                                                                             SUBJECTIVE STATEMENT: Pt reports no new seizures. Pain is about the same at 6/10. He plays video games every day on a computer monitor.  Pt accompanied by: self  PERTINENT HISTORY: seizures  PAIN:  Are you having pain? Yes: NPRS scale: 6/10 Pain location: R sided neck pain, posterior  Pain description: does note that sometimes pain is pre-ictal, stabbing Aggravating factors: "it's just there"  Relieving factors: none  noted  PRECAUTIONS: Fall and Other: seizures  RED FLAGS: Bowel or bladder incontinence: No   WEIGHT BEARING RESTRICTIONS: No  FALLS: Has patient fallen in last 6 months? Yes. Number of falls 1; out of his gaming chair when he had a seizure  LIVING ENVIRONMENT: Lives with: lives with their spouse Lives in: House/apartment Stairs: No Has following equipment at home: None  PLOF: Independent  PATIENT GOALS: "to strengthen my hand and my neck pain"  OBJECTIVE:  Note: Objective measures were completed at Evaluation unless otherwise noted.  DIAGNOSTIC FINDINGS: MRI c-spine ordered  and scheduled for 12/2  COGNITION: Overall cognitive status: Within functional limits for tasks assessed   SENSATION: Light touch: N/T in R hand and entirety of R LE  COORDINATION: Slight dysmetria R LE with figure 8, WFL heel/shin  Slight intention tremor R UE at full extension finger/nose  RAM normal UE, LE  Grip strength  R UE: average 6#  L UE: average ~50#  POSTURE: rounded shoulders, forward head, increased thoracic kyphosis, posterior pelvic tilt, flexed trunk , and weight shift right   LOWER EXTREMITY MMT:    MMT Right Eval Left Eval  Hip flexion 4- 5  Hip extension    Hip abduction 4+ 5  Hip adduction 4+ 5  Hip internal rotation    Hip external rotation    Knee flexion 4- 5  Knee extension 4- 5  Ankle dorsiflexion 3 5  Ankle plantarflexion    Ankle inversion    Ankle eversion    (Blank rows = not tested)  CERVICAL SPECIAL TESTS:  Upper limb tension test (ULTT): unable to test due to poor pain tolerance, Spurling's test: unable to test due to patient pain tolerance, Distraction test: unable to test due to patient pain tolerance, and Sharp pursor's test: increase in pain    BED MOBILITY:  Is impacted by pain and sometimes needs help   PATIENT SURVEYS:  NDI 74%  TODAY'S TREATMENT:            AROM Right (10/25/23) Left (10/25/23)      Shoulder flexion 85 150   Shoulder abduction 75- pain  150  Shoulder internal  Rotation (functional reach behind back) L3 T8  Shoulder external  rotation 20 85   Supine PROM of R shoulder into flexion, ER, IR and horizontal abduction and adduction. Was able to achieve full PROM of ER and IR without any capsular restrictions. Flexion and abduction is limited with empty end feel (mostly guarding by patient in anticipation of pain). After few minutes, we were able to get patient to 155 deg of flexion passively (again with empty end feel)  Pt educated on benefits of AROM and AAROM in R shoulder to improve nerve movement and to preserve shoulder mobility.  Pt educated on benefits of painfree strengthening and movements to preserve function in R UE and LE.  Supine chest press: dowel rod: 2 x 10 Supine 90 deg flexion: small circles: 2 x 10 CW and 2 x 10 CCW L Sidelying external rotation: 2 x 10 R only L sidelying AA shoulder abduction: with elbow bent: 20x (able to achieve 95 deg of abduction with empty end feel) Supine lower trunk rotations: 10x R and L  Scifit: UE and LE: level 1 for 10'   PATIENT EDUCATION: Education details: PT prognosis  Person educated: Patient Education method: Explanation Education comprehension: verbalized understanding and needs further education  HOME EXERCISE PROGRAM: From previous bout of PT  Access Code: [email or web address] URL: [email or web address] Date: 08/18/2022 Prepared by: Camille Bal   Exercises - Seated Upper Trapezius Stretch  - 1 x daily - 7 x weekly - 3 sets - 10 reps - Seated Levator Scapulae Stretch  - 1 x daily - 7 x weekly - 3 sets - 10 reps - Sternocleidomastoid Stretch  - 1 x daily - 7 x weekly - 3 sets - 10 reps - Chin Tuck Against Resistance with Towel  - 1 x daily - 7 x weekly - 3 sets - 10 reps - Seated Cervical Retraction  - 1 x daily -  7 x weekly - 3 sets - 10 reps - Radial Nerve Flossing  - 1 x daily - 7 x weekly - 2 sets - 10 reps - Seated  Pronation Supination with Dumbbell  - 1 x daily - 7 x weekly - 3 sets - 10 reps - Mid-Lower Cervical Extension SNAG with Strap  - 1 x daily - 7 x weekly - 2 sets - 10 reps - Putty Squeezes  - 1 x daily - 7 x weekly - 3 sets - 10 reps - 3-Point Pinch with Putty  - 1 x daily - 7 x weekly - 3 sets - 10 reps - Seated Finger Abduction with Putty  - 1 x daily - 7 x weekly - 3 sets - 10 reps  Access Code: [email or web address] URL: [email or web address] Date: 10/25/2023 Prepared by: Lavone Nian  Exercises - Seated Upper Trapezius Stretch  - 1 x daily - 7 x weekly - 3 sets - 10 reps - Seated Levator Scapulae Stretch  - 1 x daily - 7 x weekly - 3 sets - 10 reps - Sternocleidomastoid Stretch  - 1 x daily - 7 x weekly - 3 sets - 10 reps - Chin Tuck Against Resistance with Towel  - 1 x daily - 7 x weekly - 3 sets - 10 reps - Seated Cervical Retraction  - 1 x daily - 7 x weekly - 3 sets - 10 reps - Radial Nerve Flossing  - 1 x daily - 7 x weekly - 2 sets - 10 reps - Seated Pronation Supination with Dumbbell  - 1 x daily - 7 x weekly - 3 sets - 10 reps - Mid-Lower Cervical Extension SNAG with Strap  - 1 x daily - 7 x weekly - 2 sets - 10 reps - Putty Squeezes  - 1 x daily - 7 x weekly - 3 sets - 10 reps - 3-Point Pinch with Putty  - 1 x daily - 7 x weekly - 3 sets - 10 reps - Seated Finger Abduction with Putty  - 1 x daily - 7 x weekly - 3 sets - 10 reps - Supine Shoulder Press with Dowel  - 1 x daily - 7 x weekly - 2 sets - 10 reps - Supine Shoulder Flexion with Dowel  - 1 x daily - 7 x weekly - 2 sets - 10 reps  GOALS: Goals reviewed with patient? Yes  SHORT TERM GOALS: Target date: 11/04/23  Pt will be independent with initial HEP for improved functional strength   Baseline: to be provided Goal status: INITIAL  2.  Patient will improve R UE grip strength to >/= 12lbs to demonstrate improved functional strength  Baseline: 6lbs Goal status: INITIAL  3.  Patient will improve NDI to </=  30/50 to demonstrate reduced level of disability related to neck pain Baseline: 37/50 Goal status: INITIAL    LONG TERM GOALS: Target date: 12/02/23  Pt will be independent with final HEP for improved functional strength and pain reduction  Baseline: to be reviewed Goal status: INITIAL  2.  Patient will improve R UE grip strength to >/= 24lbs to demonstrate improved functionality  Baseline: 6lbs Goal status: INITIAL  3.  Patient will improve NDI to </= 23/50 to demonstrate reduced level of disability related to neck pain Baseline: 37/50 Goal status: INITIAL    ASSESSMENT:  CLINICAL IMPRESSION: Today's session focused on further evaluations. Session emphasized on improving R shoulder ROM and start general strengthening with R UE considering  patient has significant allodynia and will not be appropriate for any manual therapy at this time. Based on PROM of R shoulder, adhesive capsulitis is unlikely. Shoulder rotator cuff pathology is also unlikely due to pain pattern.    OBJECTIVE IMPAIRMENTS: decreased coordination, decreased knowledge of condition, decreased mobility, decreased ROM, decreased strength, increased edema, impaired sensation, impaired UE functional use, improper body mechanics, postural dysfunction, and pain.   ACTIVITY LIMITATIONS: carrying, lifting, sleeping, bed mobility, locomotion level, and caring for others  PARTICIPATION LIMITATIONS: meal prep, cleaning, interpersonal relationship, driving, shopping, community activity, occupation, and yard work  PERSONAL FACTORS: Age, Past/current experiences, Profession, Transportation, and 1 comorbidity: seizures  are also affecting patient's functional outcome.   REHAB POTENTIAL: Fair unknown etiology, time since onset  CLINICAL DECISION MAKING: Unstable/unpredictable  EVALUATION COMPLEXITY: High  PLAN:  PT FREQUENCY: 1x/week  PT DURATION: 6 weeks  PLANNED INTERVENTIONS: 97164- PT Re-evaluation, 97110-Therapeutic  exercises, 97530- Therapeutic activity, [id number]- Neuromuscular re-education, 97535- Self Care, [id number]- Manual therapy, [phone]- Gait training, [phone]- Orthotic Fit/training, [phone]- Canalith repositioning, [id number]- Aquatic Therapy, [phone]- Ultrasound, [id number]- Traction (mechanical), [id number]- Ionotophoresis 4mg /ml Dexamethasone, Patient/Family education, Balance training, Stair training, Taping, Dry Needling, Joint mobilization, Spinal manipulation, Spinal mobilization, Manual lymph drainage, Vestibular training, DME instructions, Cryotherapy, and Moist heat  PLAN FOR NEXT SESSION:  Review HEP, assess L-spine?, dry needling      Ileana Ladd, PT 10/25/2023, 10:59 AM       For all possible CPT codes, reference the Planned Interventions line above.     Check all conditions that are expected to impact treatment: {Conditions expected to impact treatment:Musculoskeletal disorders, Structural or anatomic abnormalities, and Neurological condition and/or seizures   If treatment provided at initial evaluation, no treatment charged due to lack of authorization.

## 2023-10-26 NOTE — Telephone Encounter (Signed)
Yes, please have patient drop the paperwork. Thanks

## 2023-10-31 ENCOUNTER — Other Ambulatory Visit: Payer: Self-pay

## 2023-10-31 MED ORDER — BRIVIACT 50 MG PO TABS: 1.0000 | ORAL_TABLET | Freq: Two times a day (BID) | ORAL | 5 refills | Status: DC

## 2023-10-31 NOTE — Telephone Encounter (Signed)
Requested Prescriptions   Pending Prescriptions Disp Refills   Brivaracetam (BRIVIACT) 50 MG TABS 60 tablet 5    Sig: Take 1 tablet by mouth 2 (two) times daily.   Last seen 09/21/23 Next appt 03/22/24 About this Score  Dispenses   Dispensed Days Supply Quantity Provider Pharmacy  BRIVIACT 50MG  TABLETS 09/23/2023 30 60 each Windell Norfolk, MD [facility]

## 2023-11-01 ENCOUNTER — Ambulatory Visit: Payer: Medicaid Other | Admitting: Physical Therapy

## 2023-11-01 ENCOUNTER — Encounter: Payer: Self-pay | Admitting: Physical Therapy

## 2023-11-01 DIAGNOSIS — R293 Abnormal posture: Secondary | ICD-10-CM

## 2023-11-01 DIAGNOSIS — M5412 Radiculopathy, cervical region: Secondary | ICD-10-CM

## 2023-11-01 DIAGNOSIS — M542 Cervicalgia: Secondary | ICD-10-CM

## 2023-11-01 DIAGNOSIS — M6281 Muscle weakness (generalized): Secondary | ICD-10-CM

## 2023-11-01 DIAGNOSIS — R252 Cramp and spasm: Secondary | ICD-10-CM

## 2023-11-01 NOTE — Therapy (Signed)
OUTPATIENT PHYSICAL THERAPY NEURO TREATMENT   Patient Name: Mario Davis MRN: [id number] DOB:16-Nov-1995, 27 y.o., male Today's Date: 11/01/2023   PCP: Janeece Agee, NP REFERRING PROVIDER: Windell Norfolk, MD  END OF SESSION:  PT End of Session - 11/01/23 1024     Visit Number 4    Number of Visits 7    Date for PT Re-Evaluation 12/02/23    Authorization Type [place] medicaid prepaid    PT Start Time 1020    PT Stop Time 1102    PT Time Calculation (min) 42 min    Equipment Utilized During Treatment Gait belt    Activity Tolerance Patient limited by pain;Patient tolerated treatment well    Behavior During Therapy Flat affect;WFL for tasks assessed/performed             Past Medical History:  Diagnosis Date   Seizures (HCC)    History reviewed. No pertinent surgical history. Patient Active Problem List   Diagnosis Date Noted   Seizure (HCC) 05/26/2021   Abdominal pain in male 04/14/2015    ONSET DATE:   09/21/2023   referral  REFERRING DIAG:  M54.12 (ICD-10-CM) - Cervical radiculopathy  R20.0 (ICD-10-CM) - Numbness  R53.1 (ICD-10-CM) - Right sided weakness    THERAPY DIAG:  Cervicalgia  Cramp and spasm  Muscle weakness (generalized)  Radiculopathy, cervical region  Abnormal posture  Rationale for Evaluation and Treatment: Rehabilitation  SUBJECTIVE:                                                                                                                                                                                             SUBJECTIVE STATEMENT: Pt reports no new seizures (last was 2 weeks ago).  Has not been sleeping, he watches TV and plays video games (PS5) during awake hours. He reports doing his HEP 3 times per week and that it does not affect his pain.  He tried the putty for his hands and reports "it's not working".   Pt accompanied by: self  PERTINENT HISTORY: seizures  PAIN:  Are you having pain? Yes: NPRS scale:  7/10 Pain location: R sided neck pain, posterior  Pain description: "somebody stabbing me in my neck" Aggravating factors: "it's just there"  Relieving factors: none noted  PRECAUTIONS: Fall and Other: seizures  RED FLAGS: Bowel or bladder incontinence: No   WEIGHT BEARING RESTRICTIONS: No  FALLS: Has patient fallen in last 6 months? Yes. Number of falls 1; out of his gaming chair when he had a seizure  LIVING ENVIRONMENT: Lives with: lives with their spouse Lives in: House/apartment Stairs: No Has following equipment  at home: None  PLOF: Independent  PATIENT GOALS: "to strengthen my hand and my neck pain"  OBJECTIVE:  Note: Objective measures were completed at Evaluation unless otherwise noted.  DIAGNOSTIC FINDINGS: MRI c-spine ordered and scheduled for 12/2  COGNITION: Overall cognitive status: Within functional limits for tasks assessed   SENSATION: Light touch: N/T in R hand and entirety of R LE  COORDINATION: Slight dysmetria R LE with figure 8, WFL heel/shin  Slight intention tremor R UE at full extension finger/nose  RAM normal UE, LE  Grip strength  R UE: average 6#  L UE: average ~50#  POSTURE: rounded shoulders, forward head, increased thoracic kyphosis, posterior pelvic tilt, flexed trunk , and weight shift right   LOWER EXTREMITY MMT:    MMT Right Eval Left Eval  Hip flexion 4- 5  Hip extension    Hip abduction 4+ 5  Hip adduction 4+ 5  Hip internal rotation    Hip external rotation    Knee flexion 4- 5  Knee extension 4- 5  Ankle dorsiflexion 3 5  Ankle plantarflexion    Ankle inversion    Ankle eversion    (Blank rows = not tested)  CERVICAL SPECIAL TESTS:  Upper limb tension test (ULTT): unable to test due to poor pain tolerance, Spurling's test: unable to test due to patient pain tolerance, Distraction test: unable to test due to patient pain tolerance, and Sharp pursor's test: increase in pain    BED MOBILITY:  Is impacted by  pain and sometimes needs help   PATIENT SURVEYS:  NDI 74%  TODAY'S TREATMENT:            Left grip: -Trial 1:  73.8 lbs -Trial 2:  69.8 lbs -Trial 3:  57.3 lbs -AVERAGE:  66.97 lbs  Right grip: -Trial 1:  22.2 lbs -Trial 2:  20.5 lbs -Trial 3:  15.6 lbs -AVERAGE:  19.43 lbs  -NDI:  41/50 = 82% = high rate of perceived disability  Discussed continuing therapy w/ pt voicing concerns for back pain and right hand today.  -LTRs x20 w/ PT providing multimodal cuing to have pt correctly initiate task; some right low back tenderness following task -SKTC using towel 2x45 seconds each side; pt has pressure above posterior right hip -Seated hamstring stretch using chair modifier x1 minute each side, pt unable to lean into stretch due to tightness -Lumbar rollouts x10 forward  PATIENT EDUCATION: Education details: Progress towards goals and interpretations of metrics.  Encouraged continuing HEP and using RUE as much as tolerated to downweight pain response over time and improve strength.  Person educated: Patient Education method: Explanation Education comprehension: verbalized understanding and needs further education  HOME EXERCISE PROGRAM: From previous bout of PT  Access Code: [email or web address] URL: [email or web address] Date: 08/18/2022 Prepared by: Camille Bal   Exercises - Seated Upper Trapezius Stretch  - 1 x daily - 7 x weekly - 3 sets - 10 reps - Seated Levator Scapulae Stretch  - 1 x daily - 7 x weekly - 3 sets - 10 reps - Sternocleidomastoid Stretch  - 1 x daily - 7 x weekly - 3 sets - 10 reps - Chin Tuck Against Resistance with Towel  - 1 x daily - 7 x weekly - 3 sets - 10 reps - Seated Cervical Retraction  - 1 x daily - 7 x weekly - 3 sets - 10 reps - Radial Nerve Flossing  - 1 x daily - 7 x weekly - 2 sets -  10 reps - Seated Pronation Supination with Dumbbell  - 1 x daily - 7 x weekly - 3 sets - 10 reps - Mid-Lower Cervical Extension SNAG with Strap  -  1 x daily - 7 x weekly - 2 sets - 10 reps - Putty Squeezes  - 1 x daily - 7 x weekly - 3 sets - 10 reps - 3-Point Pinch with Putty  - 1 x daily - 7 x weekly - 3 sets - 10 reps - Seated Finger Abduction with Putty  - 1 x daily - 7 x weekly - 3 sets - 10 reps  Access Code: [email or web address] URL: [email or web address] Date: 10/25/2023 Prepared by: Lavone Nian  Exercises - Seated Upper Trapezius Stretch  - 1 x daily - 7 x weekly - 3 sets - 10 reps - Seated Levator Scapulae Stretch  - 1 x daily - 7 x weekly - 3 sets - 10 reps - Sternocleidomastoid Stretch  - 1 x daily - 7 x weekly - 3 sets - 10 reps - Chin Tuck Against Resistance with Towel  - 1 x daily - 7 x weekly - 3 sets - 10 reps - Seated Cervical Retraction  - 1 x daily - 7 x weekly - 3 sets - 10 reps - Radial Nerve Flossing  - 1 x daily - 7 x weekly - 2 sets - 10 reps - Seated Pronation Supination with Dumbbell  - 1 x daily - 7 x weekly - 3 sets - 10 reps - Mid-Lower Cervical Extension SNAG with Strap  - 1 x daily - 7 x weekly - 2 sets - 10 reps - Putty Squeezes  - 1 x daily - 7 x weekly - 3 sets - 10 reps - 3-Point Pinch with Putty  - 1 x daily - 7 x weekly - 3 sets - 10 reps - Seated Finger Abduction with Putty  - 1 x daily - 7 x weekly - 3 sets - 10 reps - Supine Shoulder Press with Dowel  - 1 x daily - 7 x weekly - 2 sets - 10 reps - Supine Shoulder Flexion with Dowel  - 1 x daily - 7 x weekly - 2 sets - 10 reps  GOALS: Goals reviewed with patient? Yes  SHORT TERM GOALS: Target date: 11/04/23  Pt will be independent with initial HEP for improved functional strength   Baseline: Provided, recently reviewed, pt reports IND/compliance w/ mixed response (12/17) Goal status: MET  2.  Patient will improve R UE grip strength to >/= 12lbs to demonstrate improved functional strength  Baseline: 6lbs; 19.43 lbs (average - 12/17) Goal status: MET  3.  Patient will improve NDI to </= 30/50 to demonstrate reduced level of  disability related to neck pain Baseline: 37/50; 41/50 (12/17) Goal status: NOT MET    LONG TERM GOALS: Target date: 12/02/23  Pt will be independent with final HEP for improved functional strength and pain reduction  Baseline: to be reviewed Goal status: INITIAL  2.  Patient will improve R UE grip strength to >/= 24lbs to demonstrate improved functionality  Baseline: 6lbs Goal status: INITIAL  3.  Patient will improve NDI to </= 23/50 to demonstrate reduced level of disability related to neck pain Baseline: 37/50 Goal status: INITIAL    ASSESSMENT:  CLINICAL IMPRESSION: Assess STGs this visit with patient having mixed progress.  His perceived disability has increased as has his R grip strength.  Patient continues to appear withdrawn from therapist and environment with  PT concern for psychological wellbeing vs poor buy-in to physical rehab.  PT planning to further assess and treat low back discomfort and weakness as able.  Pt remains limited by high pain response to low level activity.  Will continue per POC.  OBJECTIVE IMPAIRMENTS: decreased coordination, decreased knowledge of condition, decreased mobility, decreased ROM, decreased strength, increased edema, impaired sensation, impaired UE functional use, improper body mechanics, postural dysfunction, and pain.   ACTIVITY LIMITATIONS: carrying, lifting, sleeping, bed mobility, locomotion level, and caring for others  PARTICIPATION LIMITATIONS: meal prep, cleaning, interpersonal relationship, driving, shopping, community activity, occupation, and yard work  PERSONAL FACTORS: Age, Past/current experiences, Profession, Transportation, and 1 comorbidity: seizures  are also affecting patient's functional outcome.   REHAB POTENTIAL: Fair unknown etiology, time since onset  CLINICAL DECISION MAKING: Unstable/unpredictable  EVALUATION COMPLEXITY: High  PLAN:  PT FREQUENCY: 1x/week  PT DURATION: 6 weeks  PLANNED INTERVENTIONS:  97164- PT Re-evaluation, 97110-Therapeutic exercises, 97530- Therapeutic activity, [id number]- Neuromuscular re-education, 97535- Self Care, 95284- Manual therapy, [id number]- Gait training, [phone]- Orthotic Fit/training, [phone]- Canalith repositioning, [id number]- Aquatic Therapy, [phone]- Ultrasound, [id number]- Traction (mechanical), [phone]- Ionotophoresis 4mg /ml Dexamethasone, Patient/Family education, Balance training, Stair training, Taping, Dry Needling, Joint mobilization, Spinal manipulation, Spinal mobilization, Manual lymph drainage, Vestibular training, DME instructions, Cryotherapy, and Moist heat  PLAN FOR NEXT SESSION:  Modify HEP prn - review low back stretches and add if pt having better response, assess L-spine as needed/indicated, dry needling - unsure pt would respond positively to this modality based on allodynia - work on R low back STM first and assess tolerance then review stretches.  Sadie Haber, PT, DPT 11/01/2023, 11:07 AM       For all possible CPT codes, reference the Planned Interventions line above.     Check all conditions that are expected to impact treatment: {Conditions expected to impact treatment:Musculoskeletal disorders, Structural or anatomic abnormalities, and Neurological condition and/or seizures   If treatment provided at initial evaluation, no treatment charged due to lack of authorization.

## 2023-11-03 NOTE — Telephone Encounter (Signed)
Forms completed and signed, placed in MR for pick up.

## 2023-11-03 NOTE — Telephone Encounter (Signed)
Forms completed and given to MD to review and sign.

## 2023-11-11 ENCOUNTER — Ambulatory Visit: Payer: Medicaid Other | Admitting: Physical Therapy

## 2023-11-11 ENCOUNTER — Encounter: Payer: Self-pay | Admitting: Physical Therapy

## 2023-11-11 VITALS — BP 117/68 | HR 76

## 2023-11-11 DIAGNOSIS — M6281 Muscle weakness (generalized): Secondary | ICD-10-CM

## 2023-11-11 DIAGNOSIS — M542 Cervicalgia: Secondary | ICD-10-CM | POA: Diagnosis not present

## 2023-11-11 DIAGNOSIS — M5412 Radiculopathy, cervical region: Secondary | ICD-10-CM

## 2023-11-11 DIAGNOSIS — R252 Cramp and spasm: Secondary | ICD-10-CM

## 2023-11-11 DIAGNOSIS — R293 Abnormal posture: Secondary | ICD-10-CM

## 2023-11-11 NOTE — Therapy (Signed)
OUTPATIENT PHYSICAL THERAPY NEURO TREATMENT   Patient Name: Mario Davis MRN: [id number] DOB:1996/11/03, 27 y.o., male Today's Date: 11/11/2023   PCP: Janeece Agee, NP REFERRING PROVIDER: Windell Norfolk, MD  END OF SESSION:  PT End of Session - 11/11/23 1023     Visit Number 5    Number of Visits 7    Date for PT Re-Evaluation 12/02/23    Authorization Type [place] medicaid prepaid    PT Start Time 1019    PT Stop Time 1057    PT Time Calculation (min) 38 min    Equipment Utilized During Treatment Gait belt    Activity Tolerance Patient limited by pain;Patient tolerated treatment well    Behavior During Therapy Flat affect;WFL for tasks assessed/performed             Past Medical History:  Diagnosis Date   Seizures (HCC)    History reviewed. No pertinent surgical history. Patient Active Problem List   Diagnosis Date Noted   Seizure (HCC) 05/26/2021   Abdominal pain in male 04/14/2015    ONSET DATE:   09/21/2023   referral  REFERRING DIAG:  M54.12 (ICD-10-CM) - Cervical radiculopathy  R20.0 (ICD-10-CM) - Numbness  R53.1 (ICD-10-CM) - Right sided weakness    THERAPY DIAG:  Cervicalgia  Cramp and spasm  Muscle weakness (generalized)  Radiculopathy, cervical region  Abnormal posture  Rationale for Evaluation and Treatment: Rehabilitation  SUBJECTIVE:                                                                                                                                                                                             SUBJECTIVE STATEMENT: Pt reports he has 4 seizures while in bed last Saturday.  He continues to not get much sleep - does not elaborate on amount.    Pt accompanied by: self  PERTINENT HISTORY: seizures  PAIN:  Are you having pain? Yes: NPRS scale: 6/10 Pain location: R sided neck pain, posterior  Pain description: "somebody stabbing me in my neck" Aggravating factors: "it's just there"  Relieving  factors: none noted  PRECAUTIONS: Fall and Other: seizures  RED FLAGS: Bowel or bladder incontinence: No   WEIGHT BEARING RESTRICTIONS: No  FALLS: Has patient fallen in last 6 months? Yes. Number of falls 1; out of his gaming chair when he had a seizure  LIVING ENVIRONMENT: Lives with: lives with their spouse Lives in: House/apartment Stairs: No Has following equipment at home: None  PLOF: Independent  PATIENT GOALS: "to strengthen my hand and my neck pain"  OBJECTIVE:  Note: Objective measures were completed at Evaluation unless otherwise noted.  DIAGNOSTIC FINDINGS: MRI c-spine ordered and scheduled for 12/2  COGNITION: Overall cognitive status: Within functional limits for tasks assessed   SENSATION: Light touch: N/T in R hand and entirety of R LE  COORDINATION: Slight dysmetria R LE with figure 8, WFL heel/shin  Slight intention tremor R UE at full extension finger/nose  RAM normal UE, LE  Grip strength  R UE: average 6#  L UE: average ~50#  POSTURE: rounded shoulders, forward head, increased thoracic kyphosis, posterior pelvic tilt, flexed trunk , and weight shift right   LOWER EXTREMITY MMT:    MMT Right Eval Left Eval  Hip flexion 4- 5  Hip extension    Hip abduction 4+ 5  Hip adduction 4+ 5  Hip internal rotation    Hip external rotation    Knee flexion 4- 5  Knee extension 4- 5  Ankle dorsiflexion 3 5  Ankle plantarflexion    Ankle inversion    Ankle eversion    (Blank rows = not tested)  CERVICAL SPECIAL TESTS:  Upper limb tension test (ULTT): unable to test due to poor pain tolerance, Spurling's test: unable to test due to patient pain tolerance, Distraction test: unable to test due to patient pain tolerance, and Sharp pursor's test: increase in pain    BED MOBILITY:  Is impacted by pain and sometimes needs help   PATIENT SURVEYS:  NDI 74%  TODAY'S TREATMENT:           -Time for setup and cleanup, confirmed pt is not allergic to  deep prep ingredients prior to use. -IASTM to lumbar myofascial area > spikeball for myofascial release, pt has negative pain response to all techniques used, encouraged trying slow deep breaths in supine following task in order to return to baseline over several minutes. -SKTC 2x45 seconds each LE -Supine piriformis stretch 2x45 seconds each LE -Side-lying lumbar rotation stretch (pt can only tolerate left sie-lying) x2 minutes, pt as some discomfort but declined modifying position w/ bolster/incline/pillow to decrease ROM -Attempted child's pose for 2 minutes w/ pt in reduced ROM w/o reported discomfort  PATIENT EDUCATION: Education details:  Encouraged continuing HEP and using RUE as much as tolerated to downweight pain response over time and improve strength.  Person educated: Patient Education method: Explanation Education comprehension: verbalized understanding and needs further education  HOME EXERCISE PROGRAM: From previous bout of PT  Access Code: [email or web address] URL: [email or web address] Date: 08/18/2022 Prepared by: Camille Bal   Exercises - Seated Upper Trapezius Stretch  - 1 x daily - 7 x weekly - 3 sets - 10 reps - Seated Levator Scapulae Stretch  - 1 x daily - 7 x weekly - 3 sets - 10 reps - Sternocleidomastoid Stretch  - 1 x daily - 7 x weekly - 3 sets - 10 reps - Chin Tuck Against Resistance with Towel  - 1 x daily - 7 x weekly - 3 sets - 10 reps - Seated Cervical Retraction  - 1 x daily - 7 x weekly - 3 sets - 10 reps - Radial Nerve Flossing  - 1 x daily - 7 x weekly - 2 sets - 10 reps - Seated Pronation Supination with Dumbbell  - 1 x daily - 7 x weekly - 3 sets - 10 reps - Mid-Lower Cervical Extension SNAG with Strap  - 1 x daily - 7 x weekly - 2 sets - 10 reps - Putty Squeezes  - 1 x daily - 7 x weekly - 3 sets - 10  reps - 3-Point Pinch with Putty  - 1 x daily - 7 x weekly - 3 sets - 10 reps - Seated Finger Abduction with Putty  - 1 x daily - 7 x  weekly - 3 sets - 10 reps  Access Code: [email or web address] URL: [email or web address] Date: 10/25/2023 Prepared by: Lavone Nian  Exercises - Seated Upper Trapezius Stretch  - 1 x daily - 7 x weekly - 3 sets - 10 reps - Seated Levator Scapulae Stretch  - 1 x daily - 7 x weekly - 3 sets - 10 reps - Sternocleidomastoid Stretch  - 1 x daily - 7 x weekly - 3 sets - 10 reps - Chin Tuck Against Resistance with Towel  - 1 x daily - 7 x weekly - 3 sets - 10 reps - Seated Cervical Retraction  - 1 x daily - 7 x weekly - 3 sets - 10 reps - Radial Nerve Flossing  - 1 x daily - 7 x weekly - 2 sets - 10 reps - Seated Pronation Supination with Dumbbell  - 1 x daily - 7 x weekly - 3 sets - 10 reps - Mid-Lower Cervical Extension SNAG with Strap  - 1 x daily - 7 x weekly - 2 sets - 10 reps - Putty Squeezes  - 1 x daily - 7 x weekly - 3 sets - 10 reps - 3-Point Pinch with Putty  - 1 x daily - 7 x weekly - 3 sets - 10 reps - Seated Finger Abduction with Putty  - 1 x daily - 7 x weekly - 3 sets - 10 reps - Supine Shoulder Press with Dowel  - 1 x daily - 7 x weekly - 2 sets - 10 reps - Supine Shoulder Flexion with Dowel  - 1 x daily - 7 x weekly - 2 sets - 10 reps  GOALS: Goals reviewed with patient? Yes  SHORT TERM GOALS: Target date: 11/04/23  Pt will be independent with initial HEP for improved functional strength   Baseline: Provided, recently reviewed, pt reports IND/compliance w/ mixed response (12/17) Goal status: MET  2.  Patient will improve R UE grip strength to >/= 12lbs to demonstrate improved functional strength  Baseline: 6lbs; 19.43 lbs (average - 12/17) Goal status: MET  3.  Patient will improve NDI to </= 30/50 to demonstrate reduced level of disability related to neck pain Baseline: 37/50; 41/50 (12/17) Goal status: NOT MET    LONG TERM GOALS: Target date: 12/02/23  Pt will be independent with final HEP for improved functional strength and pain reduction  Baseline: to be  reviewed Goal status: INITIAL  2.  Patient will improve R UE grip strength to >/= 24lbs to demonstrate improved functionality  Baseline: 6lbs Goal status: INITIAL  3.  Patient will improve NDI to </= 23/50 to demonstrate reduced level of disability related to neck pain Baseline: 37/50 Goal status: INITIAL    ASSESSMENT:  CLINICAL IMPRESSION: Patient remains significantly limited by pain today in both low back and right side of neck.  He has made modest progress so far with PT interventions, but would like to continue for remaining 2 visits.  PT to continue making attempts to adequately address pain management and optimize functional outcomes.  OBJECTIVE IMPAIRMENTS: decreased coordination, decreased knowledge of condition, decreased mobility, decreased ROM, decreased strength, increased edema, impaired sensation, impaired UE functional use, improper body mechanics, postural dysfunction, and pain.   ACTIVITY LIMITATIONS: carrying, lifting, sleeping, bed mobility, locomotion level, and caring  for others  PARTICIPATION LIMITATIONS: meal prep, cleaning, interpersonal relationship, driving, shopping, community activity, occupation, and yard work  PERSONAL FACTORS: Age, Past/current experiences, Profession, Transportation, and 1 comorbidity: seizures  are also affecting patient's functional outcome.   REHAB POTENTIAL: Fair unknown etiology, time since onset  CLINICAL DECISION MAKING: Unstable/unpredictable  EVALUATION COMPLEXITY: High  PLAN:  PT FREQUENCY: 1x/week  PT DURATION: 6 weeks  PLANNED INTERVENTIONS: 97164- PT Re-evaluation, 97110-Therapeutic exercises, 97530- Therapeutic activity, [id number]- Neuromuscular re-education, 97535- Self Care, 16109- Manual therapy, [id number]- Gait training, [phone]- Orthotic Fit/training, [phone]- Canalith repositioning, [id number]- Aquatic Therapy, [phone]- Ultrasound, [id number]- Traction (mechanical), [id number]- Ionotophoresis 4mg /ml Dexamethasone, Patient/Family  education, Balance training, Stair training, Taping, Dry Needling, Joint mobilization, Spinal manipulation, Spinal mobilization, Manual lymph drainage, Vestibular training, DME instructions, Cryotherapy, and Moist heat  PLAN FOR NEXT SESSION:  Modify HEP prn - review low back stretches and add if pt having better response -pt had neutral response 12/27, assess L-spine as needed/indicated, dry needling - unsure pt would respond positively to this modality based on allodynia, lumbar extension-prone pressups?  Continue working on cervical ROM and strengthening.  Sadie Haber, PT, DPT 11/11/2023, 11:05 AM       For all possible CPT codes, reference the Planned Interventions line above.     Check all conditions that are expected to impact treatment: {Conditions expected to impact treatment:Musculoskeletal disorders, Structural or anatomic abnormalities, and Neurological condition and/or seizures   If treatment provided at initial evaluation, no treatment charged due to lack of authorization.

## 2023-11-15 ENCOUNTER — Ambulatory Visit: Payer: Medicaid Other

## 2023-11-15 DIAGNOSIS — R293 Abnormal posture: Secondary | ICD-10-CM

## 2023-11-15 DIAGNOSIS — M542 Cervicalgia: Secondary | ICD-10-CM | POA: Diagnosis not present

## 2023-11-15 DIAGNOSIS — M6281 Muscle weakness (generalized): Secondary | ICD-10-CM

## 2023-11-15 NOTE — Therapy (Signed)
 OUTPATIENT PHYSICAL THERAPY NEURO TREATMENT/ DISCHARGE SUMMARY   Patient Name: Mario Davis MRN: [id number] DOB:Apr 23, 1996, 27 y.o., male Today's Date: 11/15/2023   PCP: [employer], NP REFERRING PROVIDER: Pastor Falling, MD  PHYSICAL THERAPY DISCHARGE SUMMARY  Visits from Start of Care: 6  Current functional level related to goals / functional outcomes: See below, no notable improvement   Remaining deficits: See below, pain, weakness   Education / Equipment: PT POC, HEP, PT prognosis, PT scope of care   Patient agrees to discharge. Patient goals were not met. Patient is being discharged due to did not respond to therapy.   END OF SESSION:  PT End of Session - 11/15/23 1012     Visit Number 6    Number of Visits 7    Date for PT Re-Evaluation 12/02/23    Authorization Type [place] medicaid prepaid    PT Start Time 1015    PT Stop Time 1037    PT Time Calculation (min) 22 min    Activity Tolerance Patient limited by pain    Behavior During Therapy Flat affect             Past Medical History:  Diagnosis Date   Seizures (HCC)    History reviewed. No pertinent surgical history. Patient Active Problem List   Diagnosis Date Noted   Seizure (HCC) 05/26/2021   Abdominal pain in male 04/14/2015    ONSET DATE:   09/21/2023   referral  REFERRING DIAG:  M54.12 (ICD-10-CM) - Cervical radiculopathy  R20.0 (ICD-10-CM) - Numbness  R53.1 (ICD-10-CM) - Right sided weakness    THERAPY DIAG:  Cervicalgia  Muscle weakness (generalized)  Abnormal posture  Rationale for Evaluation and Treatment: Rehabilitation  SUBJECTIVE:                                                                                                                                                                                             SUBJECTIVE STATEMENT: Patient reports doing roughly the same. Feels as though R hand has gotten stronger, but pain remains. Denies falls and  seizures.   Pt accompanied by: self  PERTINENT HISTORY: seizures  PAIN:  Are you having pain? Yes: NPRS scale: 6/10 Pain location: R sided neck pain, posterior  Pain description: somebody stabbing me in my neck Aggravating factors: it's just there  Relieving factors: none noted  PRECAUTIONS: Fall and Other: seizures   PATIENT GOALS: to strengthen my hand and my neck pain  OBJECTIVE:  Note: Objective measures were completed at Evaluation unless otherwise noted.  DIAGNOSTIC FINDINGS: MRI c-spine  IMPRESSION: This MRI of the cervical spine without  contrast shows the following: The spinal cord appears normal. Mild degenerative changes at C3-C4, C5-C6 and C6-C7 as detailed above that do not lead to spinal stenosis or nerve root compression.  Grip strength  R UE: average 28#  L UE: average ~50#   TODAY'S TREATMENT:           -extensive education re: PT scope of care, response to therapy, need for further medical management at this point  PATIENT EDUCATION: Education details:  see above Person educated: Patient Education method: Explanation Education comprehension: verbalized understanding and needs further education  HOME EXERCISE PROGRAM: From previous bout of PT  Access Code: [email or web address] URL: [email or web address] Date: 08/18/2022 Prepared by: Daved Bull   Exercises - Seated Upper Trapezius Stretch  - 1 x daily - 7 x weekly - 3 sets - 10 reps - Seated Levator Scapulae Stretch  - 1 x daily - 7 x weekly - 3 sets - 10 reps - Sternocleidomastoid Stretch  - 1 x daily - 7 x weekly - 3 sets - 10 reps - Chin Tuck Against Resistance with Towel  - 1 x daily - 7 x weekly - 3 sets - 10 reps - Seated Cervical Retraction  - 1 x daily - 7 x weekly - 3 sets - 10 reps - Radial Nerve Flossing  - 1 x daily - 7 x weekly - 2 sets - 10 reps - Seated Pronation Supination with Dumbbell  - 1 x daily - 7 x weekly - 3 sets - 10 reps - Mid-Lower Cervical Extension SNAG  with Strap  - 1 x daily - 7 x weekly - 2 sets - 10 reps - Putty Squeezes  - 1 x daily - 7 x weekly - 3 sets - 10 reps - 3-Point Pinch with Putty  - 1 x daily - 7 x weekly - 3 sets - 10 reps - Seated Finger Abduction with Putty  - 1 x daily - 7 x weekly - 3 sets - 10 reps  Access Code: [email or web address] URL: [email or web address] Date: 10/25/2023 Prepared by: Raj Blanch  Exercises - Seated Upper Trapezius Stretch  - 1 x daily - 7 x weekly - 3 sets - 10 reps - Seated Levator Scapulae Stretch  - 1 x daily - 7 x weekly - 3 sets - 10 reps - Sternocleidomastoid Stretch  - 1 x daily - 7 x weekly - 3 sets - 10 reps - Chin Tuck Against Resistance with Towel  - 1 x daily - 7 x weekly - 3 sets - 10 reps - Seated Cervical Retraction  - 1 x daily - 7 x weekly - 3 sets - 10 reps - Radial Nerve Flossing  - 1 x daily - 7 x weekly - 2 sets - 10 reps - Seated Pronation Supination with Dumbbell  - 1 x daily - 7 x weekly - 3 sets - 10 reps - Mid-Lower Cervical Extension SNAG with Strap  - 1 x daily - 7 x weekly - 2 sets - 10 reps - Putty Squeezes  - 1 x daily - 7 x weekly - 3 sets - 10 reps - 3-Point Pinch with Putty  - 1 x daily - 7 x weekly - 3 sets - 10 reps - Seated Finger Abduction with Putty  - 1 x daily - 7 x weekly - 3 sets - 10 reps - Supine Shoulder Press with Dowel  - 1 x daily - 7 x weekly - 2 sets - 10 reps - Supine  Shoulder Flexion with Dowel  - 1 x daily - 7 x weekly - 2 sets - 10 reps  GOALS: Goals reviewed with patient? Yes  SHORT TERM GOALS: Target date: 11/04/23  Pt will be independent with initial HEP for improved functional strength   Baseline: Provided, recently reviewed, pt reports IND/compliance w/ mixed response (12/17) Goal status: MET  2.  Patient will improve R UE grip strength to >/= 12lbs to demonstrate improved functional strength  Baseline: 6lbs; 19.43 lbs (average - 12/17) Goal status: MET  3.  Patient will improve NDI to </= 30/50 to demonstrate reduced  level of disability related to neck pain Baseline: 37/50; 41/50 (12/17) Goal status: NOT MET    LONG TERM GOALS: Target date: 12/02/23  Pt will be independent with final HEP for improved functional strength and pain reduction  Baseline: to be reviewed Goal status: INITIAL  2.  Patient will improve R UE grip strength to >/= 24lbs to demonstrate improved functionality  Baseline: 6lbs Goal status: INITIAL  3.  Patient will improve NDI to </= 23/50 to demonstrate reduced level of disability related to neck pain Baseline: 37/50 Goal status: INITIAL    ASSESSMENT:  CLINICAL IMPRESSION: Patient seen for skilled PT session with emphasis on patient education and discharge. Patient has made essentially no progress with PT except for a minimal improvement in his R UE grip strength, though his remains significantly less than normal and barely functional. A 27yo male has an average grip strength of 120# on his dominant side, this patients is an average of 28#. He remains with significant allodynia to R posterior lateral c-spine and low back. Imaging does not suggest why he has such significant pain/disability. 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 Given patients poor/no response to PT and continued level of disability, he would benefit from further medical management/work up prior to returning to PT. Patient verbalized understanding.   OBJECTIVE IMPAIRMENTS: decreased coordination, decreased knowledge of condition, decreased mobility, decreased ROM, decreased strength, increased edema, impaired sensation, impaired UE functional use, improper body mechanics, postural dysfunction, and pain.   ACTIVITY LIMITATIONS: carrying, lifting, sleeping, bed mobility, locomotion level, and caring for others  PARTICIPATION LIMITATIONS: meal prep, cleaning, interpersonal relationship, driving, shopping, community activity, occupation, and yard work  PERSONAL FACTORS: Age,  Past/current experiences, Profession, Transportation, and 1 comorbidity: seizures  are also affecting patient's functional outcome.   REHAB POTENTIAL: Fair unknown etiology, time since onset  CLINICAL DECISION MAKING: Unstable/unpredictable  EVALUATION COMPLEXITY: High  PLAN:  PT FREQUENCY: 1x/week  PT DURATION: 6 weeks  PLANNED INTERVENTIONS: 97164- PT Re-evaluation, 97110-Therapeutic exercises, 97530- Therapeutic activity, [id number]- Neuromuscular re-education, 97535- Self Care, 02859- Manual therapy, [phone]- Gait training, [phone]- Orthotic Fit/training, [id number]- Canalith repositioning, [id number]- Aquatic Therapy, [phone]- Ultrasound, [id number]- Traction (mechanical), [phone]- Ionotophoresis 4mg /ml Dexamethasone , Patient/Family education, Balance training, Stair training, Taping, Dry Needling, Joint mobilization, Spinal manipulation, Spinal mobilization, Manual lymph drainage, Vestibular training, DME instructions, Cryotherapy, and Moist heat  PLAN FOR NEXT SESSION:  dc from PT  Delon DELENA Pop, PT Delon DELENA Pop, PT, DPT, CBIS  11/15/2023, 10:44 AM       For all possible CPT codes, reference the Planned Interventions line above.     Check all conditions that are expected to impact treatment: {Conditions expected to impact treatment:Musculoskeletal disorders, Structural or anatomic abnormalities, and Neurological condition and/or seizures   If treatment provided at initial evaluation, no treatment charged due to lack of authorization.

## 2023-11-22 ENCOUNTER — Ambulatory Visit: Payer: Medicaid Other

## 2023-11-23 ENCOUNTER — Telehealth: Payer: Self-pay | Admitting: Neurology

## 2023-11-23 NOTE — Telephone Encounter (Signed)
 Pt paid $50 for forms

## 2023-11-24 DIAGNOSIS — Z0289 Encounter for other administrative examinations: Secondary | ICD-10-CM

## 2023-11-29 NOTE — Telephone Encounter (Signed)
 Call to patient, left voicemail. Informed Dr. Patric Dykes office trying to reach him for VNS surgery consult. Advised to call their office at [phone] and speak with seirra.  Advised if he is not interested, please inform us.

## 2023-12-07 NOTE — Telephone Encounter (Signed)
Received call from [facility] for VNS. Stated she hasn't been able to contact pt to set up consult with surgeon for VNS. I let her know I'd try and reach out again to pt to see what is going on, looks like April left a message on VM for pt last week. Called and had to LVM for patient as well, let us know if he is not wanting to do this so we can cancel order

## 2023-12-13 ENCOUNTER — Telehealth: Payer: Self-pay

## 2023-12-13 ENCOUNTER — Other Ambulatory Visit: Payer: Self-pay | Admitting: Neurology

## 2023-12-13 MED ORDER — BRIVARACETAM 100 MG PO TABS: 100.0000 mg | ORAL_TABLET | Freq: Two times a day (BID) | ORAL | 5 refills | Status: AC

## 2023-12-13 NOTE — Telephone Encounter (Signed)
Please have patient increase the Briviact to 100 mg twice daily. For now take 2 of the 50 mg twice daily until you get the new prescription.

## 2023-12-13 NOTE — Telephone Encounter (Signed)
Call to patient, he reports having 5 generalized convulsions last week. He denies head strikes and reports medication compliance. Medications reviewed during call. Valtoco was not administered to due not having with him at the time. No other sz triggers present or reported at time of seizures. We also discussed VNS and he would like to discuss more at next visit. Advised I would send to Dr. Teresa Coombs to review and reach back out with recommendations

## 2023-12-14 NOTE — Telephone Encounter (Signed)
Call to patient, reviewed medication changes ( increased briviact to 100 mg twice daily). Patient verbalized understanding.  Will call pharmacy when they open

## 2024-02-06 ENCOUNTER — Encounter: Payer: Self-pay | Admitting: Neurology

## 2024-02-06 ENCOUNTER — Ambulatory Visit: Admitting: Neurology

## 2024-03-22 ENCOUNTER — Ambulatory Visit: Payer: Medicaid Other | Admitting: Neurology

## 2024-04-04 IMAGING — CT CT HEAD W/O CM
4 series · 16 of 47 positions shown, 18 images · non-contrast
Comparison: MR 05/26/2021

CLINICAL DATA: Headache.



[Series 2: head wo · axial · 0.41mm/px · z∈[-695,-580]mm · 7 of 31 slices shown, 9 images]
[im 4/31  brain]
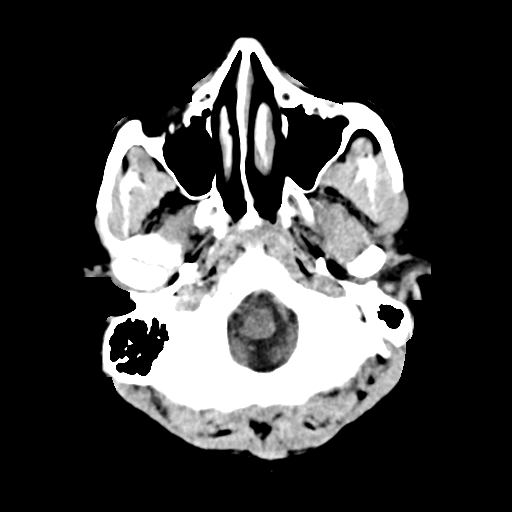
[im 4/31  bone]
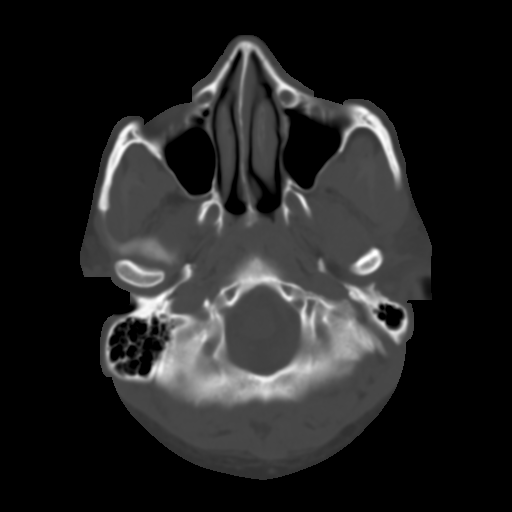
[im 8/31  brain]
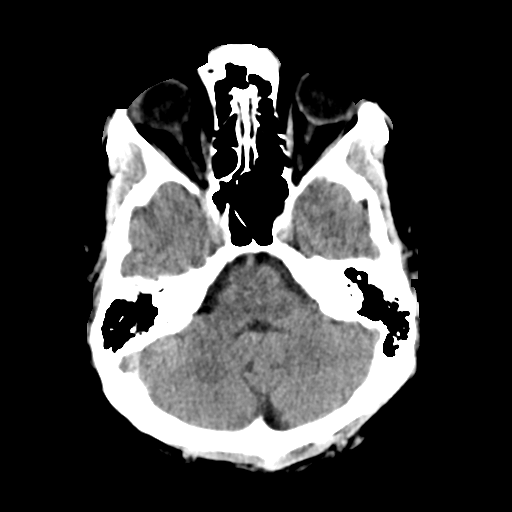
[im 12/31  brain]
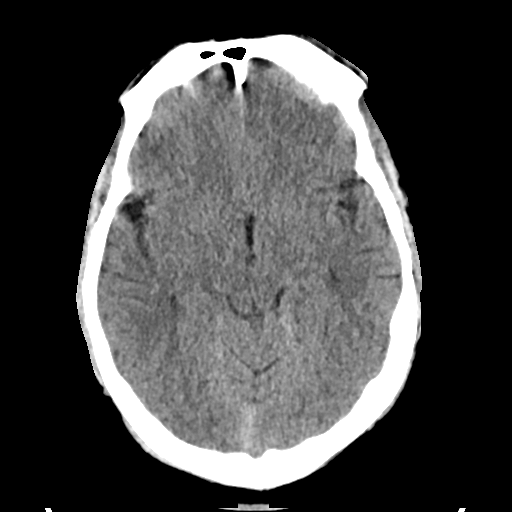
[im 16/31  brain]
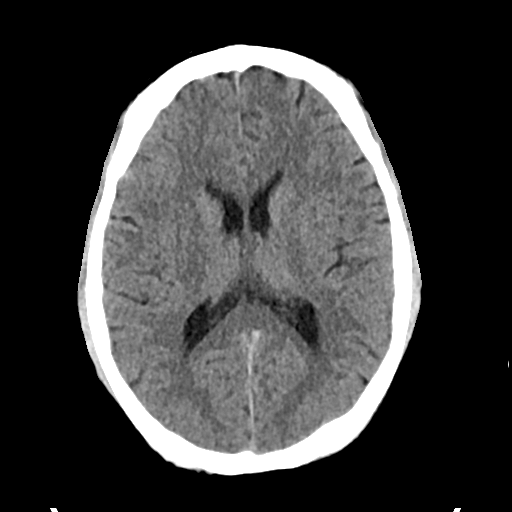
[im 19/31  brain]
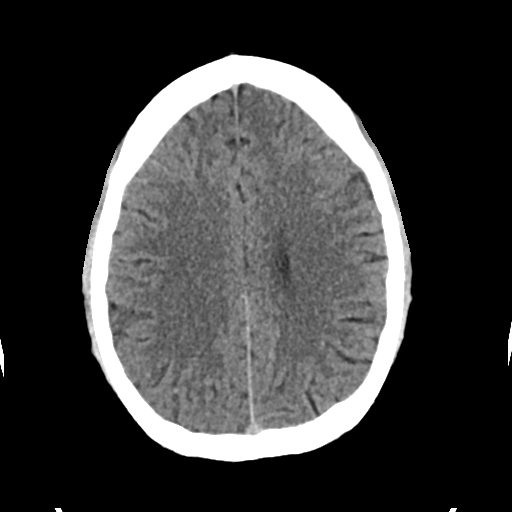
[im 19/31  bone]
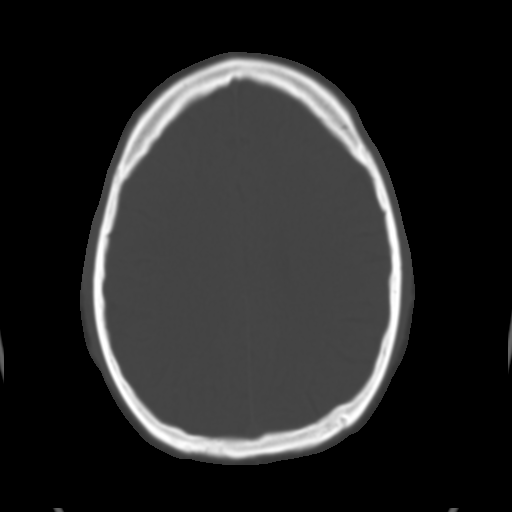
[im 23/31  brain]
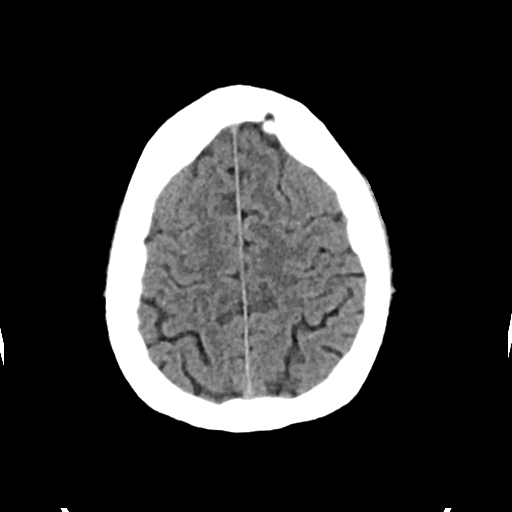
[im 27/31  brain]
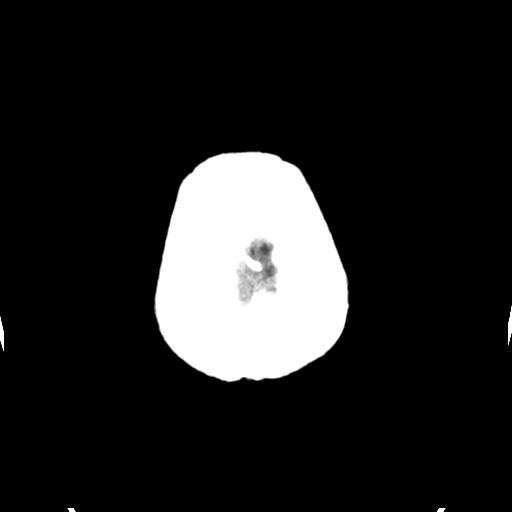

[Series 3: head bone · axial · 0.41mm/px · z∈[-696,-666]mm · 3 of 76 slices shown]
[im 8/76  bone]
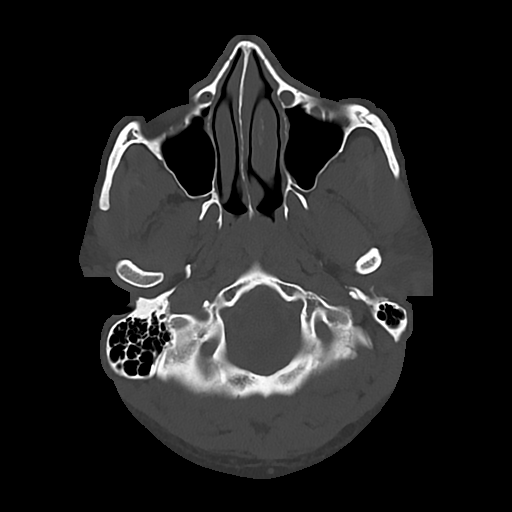
[im 16/76  bone]
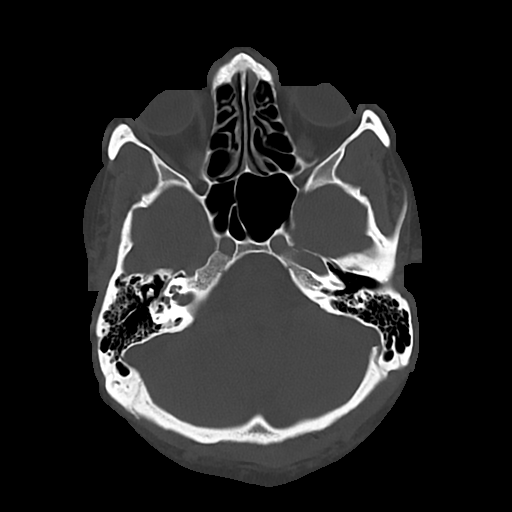
[im 23/76  bone]
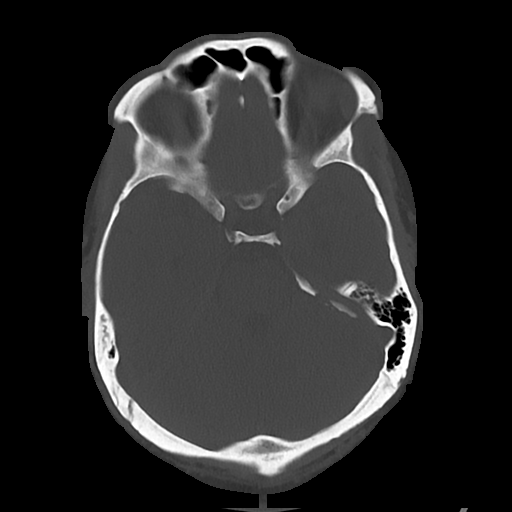

[Series 4: cor soft · coronal · 0.30mm/px · 3 of 69 slices shown]
[im 23/69  brain]
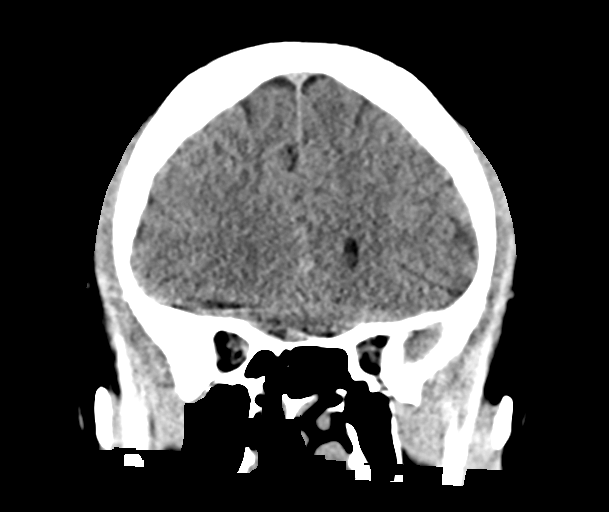
[im 31/69  brain]
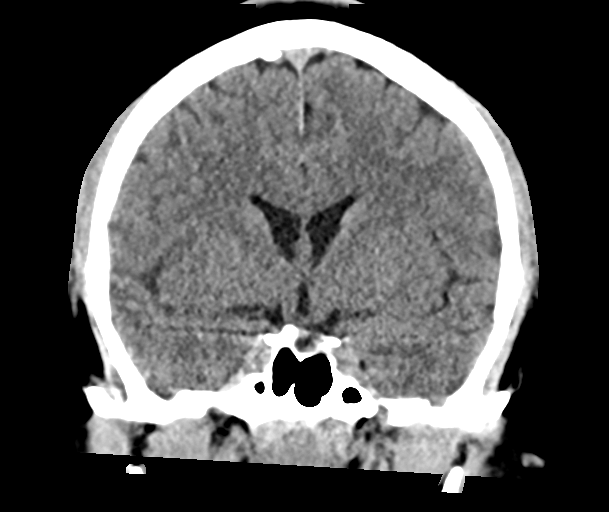
[im 38/69  brain]
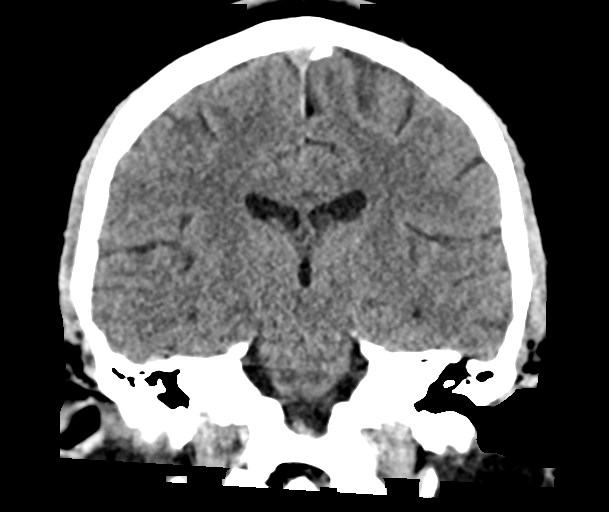

[Series 5: sag soft · sagittal · 0.30mm/px · 3 of 61 slices shown]
[im 21/61  brain]
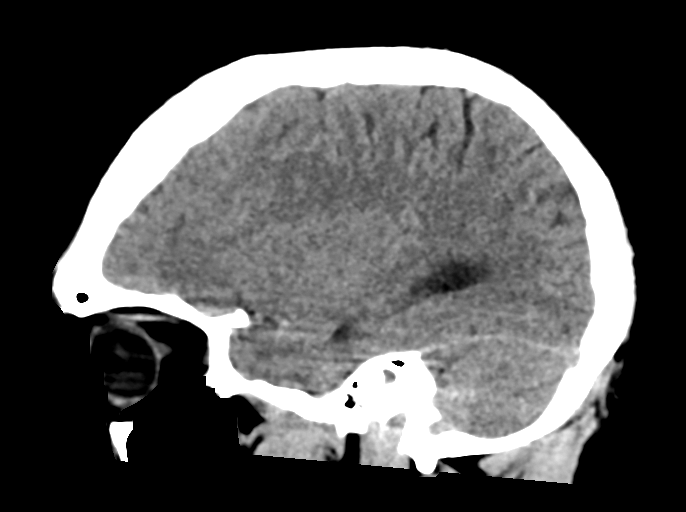
[im 31/61  brain]
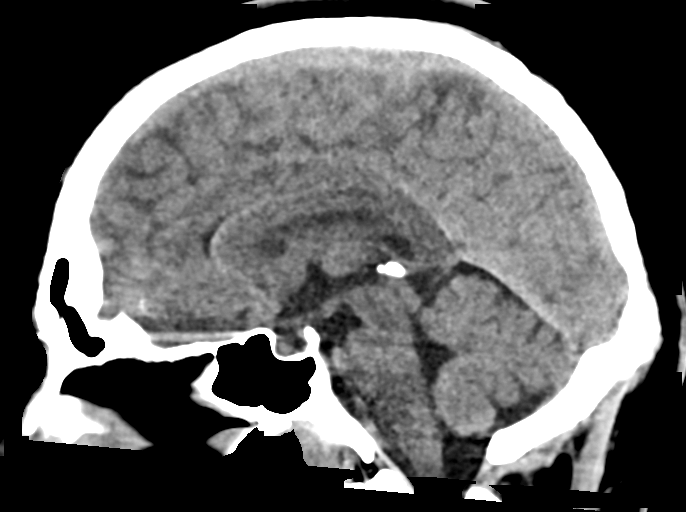
[im 41/61  brain]
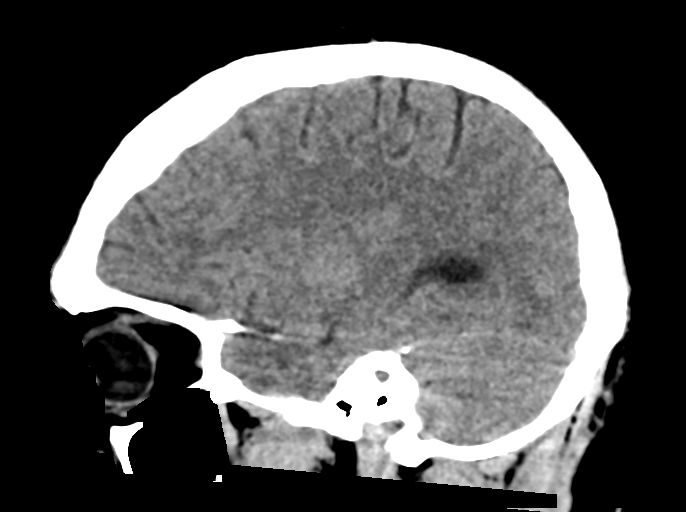

[16 of 47 positions shown; findings below may reference images not displayed]

FINDINGS: Brain: No evidence of acute infarction, hemorrhage, hydrocephalus,
extra-axial collection or mass lesion/mass effect.

Vascular: No hyperdense vessel or unexpected calcification.

Skull: Normal. Negative for fracture or focal lesion.

Sinuses/Orbits: Mild mucosal thickening is noted involving the left
maxillary sinus. The remaining paranasal sinuses are clear.

Other: None.
IMPRESSION: 1. No acute intracranial abnormalities.
2. Mild left maxillary sinus inflammation.

## 2024-06-04 ENCOUNTER — Ambulatory Visit: Admitting: Neurology

## 2024-06-04 ENCOUNTER — Encounter: Payer: Self-pay | Admitting: Neurology

## 2024-06-04 VITALS — BP 100/63 | HR 62 | Resp 14

## 2024-06-04 DIAGNOSIS — Z5181 Encounter for therapeutic drug level monitoring: Secondary | ICD-10-CM | POA: Diagnosis not present

## 2024-06-04 DIAGNOSIS — G40019 Localization-related (focal) (partial) idiopathic epilepsy and epileptic syndromes with seizures of localized onset, intractable, without status epilepticus: Secondary | ICD-10-CM | POA: Diagnosis not present

## 2024-06-04 DIAGNOSIS — R42 Dizziness and giddiness: Secondary | ICD-10-CM | POA: Diagnosis not present

## 2024-06-04 NOTE — Patient Instructions (Addendum)
 Continue with Cenobamate  200 mg daily Continue with Briviact  100 mg twice daily Decrease Depakote  to 500 mg twice daily, will check antiseizure medication level with CBC, CMP and ammonia level Return in 3 weeks for follow-up to monitor side effect once we decrease the Depakote  or sooner if worse.

## 2024-06-04 NOTE — Progress Notes (Signed)
 [facility]  PATIENT: Mario Davis DOB: [birth year]  REFERRING CLINICIAN: Kip Ade, NP HISTORY FROM: Patient and Mario Davis  REASON FOR VISIT: Epilepsy follow up.    HISTORICAL  CHIEF COMPLAINT:  Chief Complaint  Patient presents with   Seizures    Rm12, alone, last sz was 06/01/24, pt denied missed meds, then later stated that he stopped taking vimpat  2 months ago    Migraine    Rm12, alone, migraine: pt stated that he has had 8/30 days w/migraine. Triggers: anxiety, cold hands, lights   Fatigue    Rm12, alone, pt stated that he feels this medication for sz he takes makes him drowsy, dizzy, and wobbly    Gait Problem    Rm12, alone, pt is off balanced when he walks and stated that he feels dizzy due to meds   Dizziness    Rm12 alone, pt stated that he seems dizzy due to sz medication   Hematuria    Rm12, alone, pt stated that 2 nights ago he started urinating blood    Blood In Stools    Rm12 alone, pt stated that 2 nights ago he starting having bloody stools    Eye Problem    Rm12, alone, pt stated that he recently within the past couple stays started bleeding from his eyes   INTERVAL HISTORY 06/04/2024 Patient presents today for follow-up, he is alone.  Last visit was in November 2024.  At that time we continued all his medications including Depakote , cenobamate , and lacosamide .  He was experiencing dizziness, lacosamide  was discontinued and Briviact  started.  He tells me that his medications make him very sleepy, sluggish, with slurred speech.  Fortunately has not had any falls.  He tells me he last seizure was 2 weeks ago, he reports medication compliance.  Again he is not interested in VNS therapy.  He continued to have breakthrough seizures, unsure about frequency but believes he has at least seizure once a month.    INTERVAL HISTORY 09/21/2023:  Patient presents today for follow-up, he is accompanied by his fiance.  Last visit  was in May, since then we added cenobamate , and increased the Depakote  to 1000 mg twice daily.  He is still having seizures, in the past 6 months he had 3 seizures, the last one being 2 weeks ago.  He reports compliant with his medications, denies missing his medications even though taking them together can cause him to feel dizzy and have tremor at times.  He still complaining of headaches, sumatriptan  does help.  He did apply for disability but was denied. He is still complaining of right arm and leg weakness and numbness, he could not complete PT because he did have a seizure during one of the session and has not gone back.     INTERVAL HISTORY 03/31/2023:  Mario Davis presents today for follow-up, he is accompanied by girlfriend.  At last visit in February we had planned to send him to the EMU.  He did have the admission, seizures were captured coming from the right temporal region.  He did also have epileptiform discharges in the right temporal region.  Upon discharge Xcopri  was added.  Currently he is on Depakote  750 mg twice daily, Vimpat  200 mg twice daily and Xcopri  currently at 50 mg daily.  He does report side effect of tremor but has not had any seizures since discharge from the hospital.  Overall he is doing well.  He is still anxious about his  seizures and reports compliance with his medications, and good sleep habits. He is also applying for disability as he is unable to drive   INTERVAL HISTORY 7/79/7975:  Patient presents today for follow-up, he is accompanied by her significant other.  He is following up for his seizure.  He continued to have breakthrough seizures despite being on Vimpat  200 mg twice daily and Depakote  750 mg twice daily.  He reports compliance with his medicine but with each of his breakthrough seizure his antiseizure levels are undetectable.  Again patient is adamant that he is taking his medications as prescribed, he has not miss any dose.  He also has missed few follow-up  with me.  He feels he is having seizures during his sleep because he will wake up in the morning with sore jaw and tiredness. Girlfriend also reported that at night he will shake during his sleep and become unresponsive. He is also complaining of worsening anxiety.  So far he has tried Levetiracetam , Lamotrigine , Lacosamide  and Valproic  acid    INTERVAL HISTORY 06/02/2022 Patient presents today for follow-up, last visit was in January.  At that time he was doing well on Lamotrigine  100 mg BID.  Unfortunately in April, he did have a breakthrough seizure.  At that time his lamotrigine  level was 1.7.  Patient reports compliance with the medication.  I increase the lamotrigine  to 150 mg twice daily.  Since then he has been having worsening headache, complaint of numbness and tingling in the mainly right arm and right leg but no seizures.   INTERVAL HISTORY 11/19/2021:  Patient presents today for follow-up, last visit was on October 3.  At that time plan was to increase Lamictal  200 mg twice daily.  He reported compliance with medication, denies any seizures since last visit, denies any side effect from the medication.  No other concerns or complaints.  Interval History 08/17/2021:  Patient presented for follow-up, last visit was July 28, at that time he was started on lamotrigine  25 mg with plan to uptitrate until 100 mg twice a day.  For some reason, patient is currently taking 100 mg in the morning and 25 mg at night.  He said that he was not aware that his evening dose was supposed to be 100 mg.  He denies any seizures since last visit, denies any new rash but states that lamotrigine  is keeping him up at night.  He takes his evening dose around 8 PM.  No other complaints.  He is currently not working.   HISTORY OF PRESENT ILLNESS:  This is a 28 year old man with no past medical history who is presenting with new onset seizure.  Patient reported 2 weeks ago he had his first lifetime seizure.  Per  girlfriend who accompanied patient today she reported patient was asleep and she noted that he was staring at the ceiling, then followed by left face twitching followed by generalized body convulsions.  The entire episode lasted about 2 to 3-minute, after which he was very confused and combative.  He did not remember where he was, he wanted to leave the house and it took him about 30 minutes to regain consciousness.  He was taken to a local ED and admitted for additional work-up.  Initial lab work-up was unrevealing.  He did have a normal MRI of the brain.  He also have a routine EEG which was normal.  He was started on Keppra  and discharged home.  Patient stated since starting the Keppra , he has been  having headaches and pain behind eyes and he noted also that blood in the stool.  He also reported he was having some mood changes, he became angry for no reason.  He has discontinued the Keppra  4 days ago. He has a family history of seizures mother and uncle had seizures.  He denies any previous history of head trauma no other seizure risk factors noted. Since being discharged only ED he has not had any additional seizure.  He is currently not driving.  She understand that he is not supposed to drive for at least 6 months.  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  He continues to play but has not had any additional incident  Currently he denies any headaches no change in vision no abdominal pain no other complaint.   One additional issue that he brought up is chest pain 4 months ago, described as could not breathe, happened every other night, seen his PMD, had a EKG and told that everything is fine.   REVIEW OF SYSTEMS: Full 14 system review of systems performed and negative with exception of:  except as noted in the HPI  ALLERGIES: No Known Allergies  HOME MEDICATIONS: Outpatient Medications Prior to Visit  Medication Sig Dispense Refill   brivaracetam  (BRIVIACT ) 100 MG TABS tablet Take 1 tablet (100 mg total) by mouth 2 (two) times daily. 60 tablet 5   Cenobamate  200 MG TABS Take 200 mg by mouth daily. 30 tablet 5   clonazePAM  (KLONOPIN ) 1 MG tablet Take 1 tablet (1 mg total) by mouth 2 (two) times daily. 60 tablet 5   cyclobenzaprine  (FLEXERIL ) 10 MG tablet Take 1 tablet (10 mg total) by mouth as needed for up to 10 doses for muscle spasms. 10 tablet 0   diazePAM , 20 MG Dose, (VALTOCO  20 MG DOSE) 2 x 10 MG/0.1ML LQPK Spray 2 devices, 1 in each nostril as needed for seizures. Do not use again within 5 days of last use. Max of 5 doses per month. 10 each 0   divalproex  (DEPAKOTE ) 500 MG DR tablet Take 2 tablets (1,000 mg total) by mouth 2 (two) times daily. 360 tablet 3   ibuprofen  (ADVIL ) 200 MG tablet Take 200 mg by mouth every 6 (six) hours as needed for moderate pain.     Rimegepant Sulfate (NURTEC) 75 MG TBDP Take 1 tablet (75 mg total) by mouth every other day. 16 tablet 11   SUMAtriptan  (IMITREX ) 50 MG tablet Take 1 tablet (50 mg total) by mouth every 2 (two) hours as needed for migraine. May repeat in 2 hours if headache persists or recurs. 10 tablet 6   lacosamide  (VIMPAT ) 200 MG TABS tablet Take 1 tablet (200 mg total) by mouth 2 (two) times daily. (Patient not taking: Reported on 06/04/2024) 60 tablet 5   No facility-administered medications prior to visit.    PAST MEDICAL HISTORY: Past Medical History:  Diagnosis Date   Seizures (HCC)     PAST SURGICAL HISTORY: History reviewed. No pertinent surgical history.  FAMILY HISTORY: Family History  Problem Relation Age of Onset   Seizures Mother    Hypertension Father     SOCIAL HISTORY: Social History   Socioeconomic History   Marital status: Significant Other  Spouse name: Not on file   Number of  children: 0   Years of education: Not on file   Highest education level: Not on file  Occupational History   Not on file  Tobacco Use   Smoking status: Never   Smokeless tobacco: Never  Vaping Use   Vaping status: Never Used  Substance and Sexual Activity   Alcohol use: Yes   Drug use: No   Sexual activity: Not on file  Other Topics Concern   Not on file  Social History Narrative   Lives with girlfriend Mario Davis   Right Handed   Drinks 6-8 cups caffeine daily   Social Drivers of Health   Financial Resource Strain: Not on file  Food Insecurity: No Food Insecurity (02/14/2023)   Hunger Vital Sign    Worried About Running Out of Food in the Last Year: Never true    Ran Out of Food in the Last Year: Never true  Transportation Needs: No Transportation Needs (02/14/2023)   PRAPARE - Administrator, Civil Service (Medical): No    Lack of Transportation (Non-Medical): No  Physical Activity: Not on file  Stress: Not on file  Social Connections: Not on file  Intimate Partner Violence: Not At Risk (02/14/2023)   Humiliation, Afraid, Rape, and Kick questionnaire    Fear of Current or Ex-Partner: No    Emotionally Abused: No    Physically Abused: No    Sexually Abused: No    PHYSICAL EXAM  GENERAL EXAM/CONSTITUTIONAL: Vitals:  Vitals:   06/04/24 0920  BP: 100/63  Pulse: 62  Resp: 14  SpO2: 96%    There is no height or weight on file to calculate BMI. Wt Readings from Last 3 Encounters:  09/21/23 182 lb 8 oz (82.8 kg)  03/31/23 204 lb (92.5 kg)  01/04/23 199 lb 8 oz (90.5 kg)   Patient is in no distress; well developed, nourished and groomed; neck is supple   NEUROLOGIC: MENTAL STATUS:      No data to display         awake, alert, oriented to person, place and time recent and remote memory intact normal attention and concentration Mild to moderate dysarthria, comprehension intact, naming intact fund of knowledge appropriate  CRANIAL NERVE:  2nd,  3rd, 4th, 6th -visual fields full to confrontation, extraocular muscles intact, no nystagmus 5th - facial sensation symmetric 7th - facial strength symmetric 8th - hearing intact 9th - palate elevates symmetrically, uvula midline 11th - shoulder shrug symmetric 12th - tongue protrusion midline   MOTOR:   4/5 weakness in the right upper and lower extremity when compared to he left   He does have weak hand grip on the right 4/5  Reflexes are diminished on the right brachioradialis and patellar 2+ when they are brisk on the left 3+   GAIT/STATION:  Unsteady gait    DIAGNOSTIC DATA (LABS, IMAGING, TESTING) - I reviewed patient records, labs, notes, testing and imaging myself where available.  Lab Results  Component Value Date   WBC 6.1 02/14/2023   HGB 14.4 02/14/2023   HCT 40.6 02/14/2023   MCV 89.4 02/14/2023   PLT 280 02/14/2023      Component Value Date/Time   NA 140 09/21/2023 1044   K 4.1 09/21/2023 1044   CL 102 09/21/2023 1044   CO2 23 09/21/2023 1044   GLUCOSE 90 09/21/2023 1044   GLUCOSE 129 (H) 02/14/2023 1036   BUN 14 09/21/2023 1044   CREATININE  0.95 09/21/2023 1044   CALCIUM 9.9 09/21/2023 1044   PROT 7.9 09/21/2023 1044   ALBUMIN 4.7 09/21/2023 1044   AST 28 09/21/2023 1044   ALT 38 09/21/2023 1044   ALKPHOS 111 09/21/2023 1044   BILITOT 0.3 09/21/2023 1044   GFRNONAA >60 02/14/2023 1036   GFRAA >60 11/21/2017 2155   No results found for: CHOL, HDL, LDLCALC, LDLDIRECT, TRIG, CHOLHDL Lab Results  Component Value Date   HGBA1C 5.6 02/15/2023   Lab Results  Component Value Date   VITAMINB12 338 07/13/2022   Lab Results  Component Value Date   TSH 0.368 05/27/2021    MRI Brain: Reviewed, within normal limits   Routine EEG: Normal routine EEG  EMU 02/16/23:  ABNORMALITY - Spike, right anterior temporal region -  Focal seizure, right temporal region   IMPRESSION: This study showed two focal seizures as described above on  02/15/2023 at 2135 and on 02/16/2023 at 0239, lasting about 2 and 3 minutes respectively. Additionally there is epileptogenicity in right anterior temporal region.   ASSESSMENT AND PLAN  28 y.o. year old male here for follow-up for his intractable epilepsy and headaches.   For his epilepsy, currently he is on cenobamate  200 mg daily, Depakote  1000 mg twice daily and Briviact  100 mg twice daily but continued to have side effect of sluggishness, dizziness, gait abnormality, slurred speech.  He is on Klonopin  1 mg twice daily but denies taking any drugs or any additional Klonopin .  I will check antiseizure medication level and ammonia level.  Advised him to decrease his Depakote  to 500 mg twice daily and I will see him in 3 weeks for follow-up.  Again he is not interested in VNS therapy.  He did mention episodes of hematuria and bloody stool, I did advise him to follow-up with PCP. He voiced understanding.      1. Partial idiopathic epilepsy with seizures of localized onset, intractable, without status epilepticus (HCC)   2. Therapeutic drug monitoring   3. Dizziness      Patient Instructions  Continue with Cenobamate  200 mg daily Continue with Briviact  100 mg twice daily Decrease Depakote  to 500 mg twice daily, will check antiseizure medication level with CBC, CMP and ammonia level Return in 3 weeks for follow-up to monitor side effect once we decrease the Depakote  or sooner if worse.  --------------------------------------------   Per [place]  DMV statutes, patients with seizures are not allowed to drive until they have been seizure-free for six months.  Other recommendations include using caution when using heavy equipment or power tools. Avoid working on ladders or at heights. Take showers instead of baths.  Do not swim alone.  Ensure the water  temperature is not too high on the home water  heater. Do not go swimming alone. Do not lock yourself in a room alone (i.e. bathroom). When caring  for infants or small children, sit down when holding, feeding, or changing them to minimize risk of injury to the child in the event you have a seizure. Maintain good sleep hygiene. Avoid alcohol.  Also recommend adequate sleep, hydration, good diet and minimize stress.   During the Seizure  - First, ensure adequate ventilation and place patients on the floor on their left side  Loosen clothing around the neck and ensure the airway is patent. If the patient is clenching the teeth, do not force the mouth open with any object as this can cause severe damage - Remove all items from the surrounding that can be hazardous.  The patient may be oblivious to what's happening and may not even know what he or she is doing. If the patient is confused and wandering, either gently guide him/her away and block access to outside areas - Reassure the individual and be comforting - Call 911. In most cases, the seizure ends before EMS arrives. However, there are cases when seizures may last over 3 to 5 minutes. Or the individual may have developed breathing difficulties or severe injuries. If a pregnant patient or a person with diabetes develops a seizure, it is prudent to call an ambulance. - Finally, if the patient does not regain full consciousness, then call EMS. Most patients will remain confused for about 45 to 90 minutes after a seizure, so you must use judgment in calling for help. - Avoid restraints but make sure the patient is in a bed with padded side rails - Place the individual in a lateral position with the neck slightly flexed; this will help the saliva drain from the mouth and prevent the tongue from falling backward - Remove all nearby furniture and other hazards from the area - Provide verbal assurance as the individual is regaining consciousness - Provide the patient with privacy if possible - Call for help and start treatment as ordered by the caregiver   After the Seizure (Postictal  Stage)  After a seizure, most patients experience confusion, fatigue, muscle pain and/or a headache. Thus, one should permit the individual to sleep. For the next few days, reassurance is essential. Being calm and helping reorient the person is also of importance.  Most seizures are painless and end spontaneously. Seizures are not harmful to others but can lead to complications such as stress on the lungs, brain and the heart. Individuals with prior lung problems may develop labored breathing and respiratory distress.     Orders Placed This Encounter  Procedures   CMP   Cenobamate  (Xcopri ), Plasma   Valproic  Acid Level   CBC with Differential/Platelets   Ammonia     No orders of the defined types were placed in this encounter.    Return in about 22 days (around 06/26/2024).   Pastor Falling, MD 06/04/2024, 10:02 AM  [facility] [address], [address] [place], [place] [address] [phone]

## 2024-06-05 LAB — COMPREHENSIVE METABOLIC PANEL WITH GFR
ALT: 56 IU/L — ABNORMAL HIGH (ref 0–44)
AST: 26 IU/L (ref 0–40)
Albumin: 4.2 g/dL — ABNORMAL LOW (ref 4.3–5.2)
Alkaline Phosphatase: 85 IU/L (ref 44–121)
BUN/Creatinine Ratio: 10 (ref 9–20)
BUN: 11 mg/dL (ref 6–20)
Bilirubin Total: 0.4 mg/dL (ref 0.0–1.2)
CO2: 22 mmol/L (ref 20–29)
Calcium: 9 mg/dL (ref 8.7–10.2)
Chloride: 105 mmol/L (ref 96–106)
Creatinine, Ser: 1.06 mg/dL (ref 0.76–1.27)
Globulin, Total: 2.7 g/dL (ref 1.5–4.5)
Glucose: 84 mg/dL (ref 70–99)
Potassium: 3.8 mmol/L (ref 3.5–5.2)
Sodium: 146 mmol/L — ABNORMAL HIGH (ref 134–144)
Total Protein: 6.9 g/dL (ref 6.0–8.5)
eGFR: 99 mL/min/1.73 (ref >59)

## 2024-06-20 ENCOUNTER — Other Ambulatory Visit: Payer: Self-pay | Admitting: Neurology

## 2024-06-26 ENCOUNTER — Encounter: Payer: Self-pay | Admitting: Neurology

## 2024-06-26 ENCOUNTER — Ambulatory Visit (INDEPENDENT_AMBULATORY_CARE_PROVIDER_SITE_OTHER): Admitting: Neurology

## 2024-06-26 VITALS — BP 112/68 | HR 93 | Ht 73.0 in | Wt 179.5 lb

## 2024-06-26 DIAGNOSIS — R42 Dizziness and giddiness: Secondary | ICD-10-CM

## 2024-06-26 DIAGNOSIS — Z5181 Encounter for therapeutic drug level monitoring: Secondary | ICD-10-CM

## 2024-06-26 DIAGNOSIS — G40019 Localization-related (focal) (partial) idiopathic epilepsy and epileptic syndromes with seizures of localized onset, intractable, without status epilepticus: Secondary | ICD-10-CM | POA: Diagnosis not present

## 2024-06-26 MED ORDER — CENOBAMATE 200 MG PO TABS: 200.0000 mg | ORAL_TABLET | Freq: Every day | ORAL | 5 refills | Status: DC

## 2024-06-26 MED ORDER — BRIVARACETAM 100 MG PO TABS: 100.0000 mg | ORAL_TABLET | Freq: Two times a day (BID) | ORAL | 5 refills | Status: DC

## 2024-06-26 NOTE — Patient Instructions (Signed)
 Discontinue Depakote  Continue with Cenobamate  200 mg twice daily Continue with Briviact  100 mg twice daily Continue Klonopin  0.5 mg twice daily Referral to Dr. Lanis for VNS therapy Return in 1 year for follow-up or sooner if worse.

## 2024-06-26 NOTE — Progress Notes (Signed)
 [facility]  PATIENT: HERNDON GRILL Rodriguez-Jimenez DOB: 1996/10/02  REFERRING CLINICIAN: Kip Ade, NP HISTORY FROM: Patient and [name]  REASON FOR VISIT: Epilepsy follow up.    HISTORICAL  CHIEF COMPLAINT:  Chief Complaint  Patient presents with   Follow-up    Rm 13, sz, with fiance ariana, last sz 8/1, difficulty hearing in right ear and pain, swelling noted in that area   INTERVAL HISTORY 06/26/2024:  Patient presents today for follow-up follow-up, he is accompanied by fianc.  Last visit was on July 21 at that time he was complaining of extreme dizziness.  I decreased his Depakote  to 500 mg twice daily but he tells me that his symptoms are still the same, still have dizziness and feels like he is better once he skipped his Depakote .  He reports that his last seizure was on August 1 witnessed by girlfriend described by generalized convulsion.  He did bite his tongue.   INTERVAL HISTORY 06/04/2024 Patient presents today for follow-up, he is alone.  Last visit was in November 2024.  At that time we continued all his medications including Depakote , cenobamate , and lacosamide .  He was experiencing dizziness, lacosamide  was discontinued and Briviact  started.  He tells me that his medications make him very sleepy, sluggish, with slurred speech.  Fortunately has not had any falls.  He tells me he last seizure was 2 weeks ago, he reports medication compliance.  Again he is not interested in VNS therapy.  He continued to have breakthrough seizures, unsure about frequency but believes he has at least seizure once a month.    INTERVAL HISTORY 09/21/2023:  Patient presents today for follow-up, he is accompanied by his fiance.  Last visit was in May, since then we added cenobamate , and increased the Depakote  to 1000 mg twice daily.  He is still having seizures, in the past 6 months he had 3 seizures, the last one being 2 weeks ago.  He reports compliant with his  medications, denies missing his medications even though taking them together can cause him to feel dizzy and have tremor at times.  He still complaining of headaches, sumatriptan  does help.  He did apply for disability but was denied. He is still complaining of right arm and leg weakness and numbness, he could not complete PT because he did have a seizure during one of the session and has not gone back.     INTERVAL HISTORY 03/31/2023:  Maycol presents today for follow-up, he is accompanied by girlfriend.  At last visit in February we had planned to send him to the EMU.  He did have the admission, seizures were captured coming from the right temporal region.  He did also have epileptiform discharges in the right temporal region.  Upon discharge Xcopri  was added.  Currently he is on Depakote  750 mg twice daily, Vimpat  200 mg twice daily and Xcopri  currently at 50 mg daily.  He does report side effect of tremor but has not had any seizures since discharge from the hospital.  Overall he is doing well.  He is still anxious about his seizures and reports compliance with his medications, and good sleep habits. He is also applying for disability as he is unable to drive   INTERVAL HISTORY 7/79/7975:  Patient presents today for follow-up, he is accompanied by her significant other.  He is following up for his seizure.  He continued to have breakthrough seizures despite being on Vimpat  200 mg twice daily and Depakote   750 mg twice daily.  He reports compliance with his medicine but with each of his breakthrough seizure his antiseizure levels are undetectable.  Again patient is adamant that he is taking his medications as prescribed, he has not miss any dose.  He also has missed few follow-up with me.  He feels he is having seizures during his sleep because he will wake up in the morning with sore jaw and tiredness. Girlfriend also reported that at night he will shake during his sleep and become unresponsive. He is  also complaining of worsening anxiety.  So far he has tried Levetiracetam , Lamotrigine , Lacosamide  and Valproic  acid    INTERVAL HISTORY 06/02/2022 Patient presents today for follow-up, last visit was in January.  At that time he was doing well on Lamotrigine  100 mg BID.  Unfortunately in April, he did have a breakthrough seizure.  At that time his lamotrigine  level was 1.7.  Patient reports compliance with the medication.  I increase the lamotrigine  to 150 mg twice daily.  Since then he has been having worsening headache, complaint of numbness and tingling in the mainly right arm and right leg but no seizures.   INTERVAL HISTORY 11/19/2021:  Patient presents today for follow-up, last visit was on October 3.  At that time plan was to increase Lamictal  200 mg twice daily.  He reported compliance with medication, denies any seizures since last visit, denies any side effect from the medication.  No other concerns or complaints.  Interval History 08/17/2021:  Patient presented for follow-up, last visit was July 28, at that time he was started on lamotrigine  25 mg with plan to uptitrate until 100 mg twice a day.  For some reason, patient is currently taking 100 mg in the morning and 25 mg at night.  He said that he was not aware that his evening dose was supposed to be 100 mg.  He denies any seizures since last visit, denies any new rash but states that lamotrigine  is keeping him up at night.  He takes his evening dose around 8 PM.  No other complaints.  He is currently not working.   HISTORY OF PRESENT ILLNESS:  This is a 28 year old man with no past medical history who is presenting with new onset seizure.  Patient reported 2 weeks ago he had his first lifetime seizure.  Per girlfriend who accompanied patient today she reported patient was asleep and she noted that he was staring at the ceiling, then followed by left face twitching followed by generalized body convulsions.  The entire episode lasted about 2  to 3-minute, after which he was very confused and combative.  He did not remember where he was, he wanted to leave the house and it took him about 30 minutes to regain consciousness.  He was taken to a local ED and admitted for additional work-up.  Initial lab work-up was unrevealing.  He did have a normal MRI of the brain.  He also have a routine EEG which was normal.  He was started on Keppra  and discharged home.  Patient stated since starting the Keppra , he has been having headaches and pain behind eyes and he noted also that blood in the stool.  He also reported he was having some mood changes, he became angry for no reason.  He has discontinued the Keppra  4 days ago. He has a family history of seizures mother and uncle had seizures.  He denies any previous history of head trauma no other seizure risk factors  noted. Since being discharged only ED he has not had any additional seizure.  He is currently not driving.  She understand that he is not supposed to drive for at least 6 months.  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  He continues to play but has not had any additional incident  Currently he denies any headaches no change in vision no abdominal pain no other complaint.   One additional issue that he brought up is chest pain 4 months ago, described as could not breathe, happened every other night, seen his PMD, had a EKG and told that everything is fine.   REVIEW OF SYSTEMS: Full 14 system review of systems performed and negative with exception of: except as noted in the HPI  ALLERGIES: No Known Allergies  HOME MEDICATIONS: Outpatient Medications Prior to Visit  Medication Sig Dispense Refill   clonazePAM  (KLONOPIN ) 0.5 MG tablet Take 1 tablet (0.5 mg total) by mouth 2  (two) times daily. 60 tablet 2   cyclobenzaprine  (FLEXERIL ) 10 MG tablet Take 1 tablet (10 mg total) by mouth as needed for up to 10 doses for muscle spasms. 10 tablet 0   diazePAM , 20 MG Dose, (VALTOCO  20 MG DOSE) 2 x 10 MG/0.1ML LQPK Spray 2 devices, 1 in each nostril as needed for seizures. Do not use again within 5 days of last use. Max of 5 doses per month. 10 each 0   ibuprofen  (ADVIL ) 200 MG tablet Take 200 mg by mouth every 6 (six) hours as needed for moderate pain.     Rimegepant Sulfate (NURTEC) 75 MG TBDP Take 1 tablet (75 mg total) by mouth every other day. 16 tablet 11   SUMAtriptan  (IMITREX ) 50 MG tablet Take 1 tablet (50 mg total) by mouth every 2 (two) hours as needed for migraine. May repeat in 2 hours if headache persists or recurs. 10 tablet 6   Cenobamate  200 MG TABS Take 200 mg by mouth daily. 30 tablet 5   divalproex  (DEPAKOTE ) 500 MG DR tablet Take 2 tablets (1,000 mg total) by mouth 2 (two) times daily. 360 tablet 3   No facility-administered medications prior to visit.    PAST MEDICAL HISTORY: Past Medical History:  Diagnosis Date   Seizures (HCC)     PAST SURGICAL HISTORY: History reviewed. No pertinent surgical history.  FAMILY HISTORY: Family History  Problem Relation Age of Onset   Seizures Mother    Hypertension Father     SOCIAL HISTORY: Social History   Socioeconomic History   Marital status: Significant Other    Spouse name: Not on file   Number of children: 0   Years of education: Not on file   Highest education level: Not on file  Occupational History   Not on file  Tobacco Use   Smoking status: Never   Smokeless tobacco: Never  Vaping Use   Vaping status: Never Used  Substance and Sexual Activity   Alcohol use: Yes   Drug use: No   Sexual activity: Not on file  Other Topics Concern   Not on file  Social History Narrative   Lives with girlfriend [name]   Right Handed   Drinks 6-8 cups caffeine daily   Doesn't work   Social  Drivers  of Health   Financial Resource Strain: Not on file  Food Insecurity: No Food Insecurity (02/14/2023)   Hunger Vital Sign    Worried About Running Out of Food in the Last Year: Never true    Ran Out of Food in the Last Year: Never true  Transportation Needs: No Transportation Needs (02/14/2023)   PRAPARE - Administrator, Civil Service (Medical): No    Lack of Transportation (Non-Medical): No  Physical Activity: Not on file  Stress: Not on file  Social Connections: Not on file  Intimate Partner Violence: Not At Risk (02/14/2023)   Humiliation, Afraid, Rape, and Kick questionnaire    Fear of Current or Ex-Partner: No    Emotionally Abused: No    Physically Abused: No    Sexually Abused: No    PHYSICAL EXAM  GENERAL EXAM/CONSTITUTIONAL: Vitals:  Vitals:   06/26/24 1535  BP: 112/68  Pulse: 93  SpO2: 98%  Weight: 179 lb 8 oz (81.4 kg)  Height: 6' 1 (1.854 m)     Body mass index is 23.68 kg/m. Wt Readings from Last 3 Encounters:  06/26/24 179 lb 8 oz (81.4 kg)  09/21/23 182 lb 8 oz (82.8 kg)  03/31/23 204 lb (92.5 kg)   Patient is in no distress; well developed, nourished and groomed; neck is supple   NEUROLOGIC: MENTAL STATUS:      No data to display         awake, alert, oriented to person, place and time recent and remote memory intact normal attention and concentration Mild to moderate dysarthria, comprehension intact, naming intact fund of knowledge appropriate  CRANIAL NERVE:  2nd, 3rd, 4th, 6th -visual fields full to confrontation, extraocular muscles intact, no nystagmus 5th - facial sensation symmetric 7th - facial strength symmetric 8th - hearing intact 9th - palate elevates symmetrically, uvula midline 11th - shoulder shrug symmetric 12th - tongue protrusion midline   MOTOR:   4/5 weakness in the right upper and lower extremity when compared to he left   He does have weak hand grip on the right 4/5  Reflexes are diminished on  the right brachioradialis and patellar 2+ when they are brisk on the left 3+  GAIT/STATION:  Slight unsteady gait    DIAGNOSTIC DATA (LABS, IMAGING, TESTING) - I reviewed patient records, labs, notes, testing and imaging myself where available.  Lab Results  Component Value Date   WBC 6.1 02/14/2023   HGB 14.4 02/14/2023   HCT 40.6 02/14/2023   MCV 89.4 02/14/2023   PLT 280 02/14/2023      Component Value Date/Time   NA 146 (H) 06/04/2024 1001   K 3.8 06/04/2024 1001   CL 105 06/04/2024 1001   CO2 22 06/04/2024 1001   GLUCOSE 84 06/04/2024 1001   GLUCOSE 129 (H) 02/14/2023 1036   BUN 11 06/04/2024 1001   CREATININE 1.06 06/04/2024 1001   CALCIUM 9.0 06/04/2024 1001   PROT 6.9 06/04/2024 1001   ALBUMIN 4.2 (L) 06/04/2024 1001   AST 26 06/04/2024 1001   ALT 56 (H) 06/04/2024 1001   ALKPHOS 85 06/04/2024 1001   BILITOT 0.4 06/04/2024 1001   GFRNONAA >60 02/14/2023 1036   GFRAA >60 11/21/2017 2155   No results found for: CHOL, HDL, LDLCALC, LDLDIRECT, TRIG, CHOLHDL Lab Results  Component Value Date   HGBA1C 5.6 02/15/2023   Lab Results  Component Value Date   VITAMINB12 338 07/13/2022   Lab Results  Component Value Date   TSH  0.368 05/27/2021    MRI Brain: Reviewed, within normal limits   Routine EEG: Normal routine EEG  EMU 02/16/23:  ABNORMALITY - Spike, right anterior temporal region -  Focal seizure, right temporal region   IMPRESSION: This study showed two focal seizures as described above on 02/15/2023 at 2135 and on 02/16/2023 at 0239, lasting about 2 and 3 minutes respectively. Additionally there is epileptogenicity in right anterior temporal region.   ASSESSMENT AND PLAN  28 y.o. year old male here for follow-up for his intractable epilepsy and headaches.   For his epilepsy, currently he is on cenobamate  200 mg daily, Depakote  500 mg twice daily and Briviact  100 mg twice daily but continued to have side effect of sluggishness, dizziness,  gait abnormality, slurred speech.  I will discontinue his Depakote , continue patient on cenobamate , Briviact  and Klonopin  0.5 mg.  We also discussed VNS therapy.  He is interested in a surgical consultation.  Will send referral.  I will see him in 1 month for follow-up or sooner if worse    1. Partial idiopathic epilepsy with seizures of localized onset, intractable, without status epilepticus (HCC)   2. Therapeutic drug monitoring   3. Dizziness     Patient Instructions  Discontinue Depakote  Continue with Cenobamate  200 mg twice daily Continue with Briviact  100 mg twice daily Continue Klonopin  0.5 mg twice daily Referral to Dr. Nundkumar for VNS therapy Return in 1 year for follow-up or sooner if worse.  --------------------------------------------   Per [place]  DMV statutes, patients with seizures are not allowed to drive until they have been seizure-free for six months.  Other recommendations include using caution when using heavy equipment or power tools. Avoid working on ladders or at heights. Take showers instead of baths.  Do not swim alone.  Ensure the water  temperature is not too high on the home water  heater. Do not go swimming alone. Do not lock yourself in a room alone (i.e. bathroom). When caring for infants or small children, sit down when holding, feeding, or changing them to minimize risk of injury to the child in the event you have a seizure. Maintain good sleep hygiene. Avoid alcohol.  Also recommend adequate sleep, hydration, good diet and minimize stress.   During the Seizure  - First, ensure adequate ventilation and place patients on the floor on their left side  Loosen clothing around the neck and ensure the airway is patent. If the patient is clenching the teeth, do not force the mouth open with any object as this can cause severe damage - Remove all items from the surrounding that can be hazardous. The patient may be oblivious to what's happening and may not  even know what he or she is doing. If the patient is confused and wandering, either gently guide him/her away and block access to outside areas - Reassure the individual and be comforting - Call 911. In most cases, the seizure ends before EMS arrives. However, there are cases when seizures may last over 3 to 5 minutes. Or the individual may have developed breathing difficulties or severe injuries. If a pregnant patient or a person with diabetes develops a seizure, it is prudent to call an ambulance. - Finally, if the patient does not regain full consciousness, then call EMS. Most patients will remain confused for about 45 to 90 minutes after a seizure, so you must use judgment in calling for help. - Avoid restraints but make sure the patient is in a bed with padded side rails -  Place the individual in a lateral position with the neck slightly flexed; this will help the saliva drain from the mouth and prevent the tongue from falling backward - Remove all nearby furniture and other hazards from the area - Provide verbal assurance as the individual is regaining consciousness - Provide the patient with privacy if possible - Call for help and start treatment as ordered by the caregiver   After the Seizure (Postictal Stage)  After a seizure, most patients experience confusion, fatigue, muscle pain and/or a headache. Thus, one should permit the individual to sleep. For the next few days, reassurance is essential. Being calm and helping reorient the person is also of importance.  Most seizures are painless and end spontaneously. Seizures are not harmful to others but can lead to complications such as stress on the lungs, brain and the heart. Individuals with prior lung problems may develop labored breathing and respiratory distress.     Orders Placed This Encounter  Procedures   Valproic  Acid Level   CBC with Differential/Platelets   Ammonia   CMP   Ambulatory referral to Neurosurgery     Meds  ordered this encounter  Medications   cenobamate  (XCOPRI ) 200 MG TABS    Sig: Take 1 tablet (200 mg total) by mouth daily.    Dispense:  30 tablet    Refill:  5   brivaracetam  (BRIVIACT ) 100 MG TABS tablet    Sig: Take 1 tablet (100 mg total) by mouth 2 (two) times daily.    Dispense:  60 tablet    Refill:  5     Return in about 5 weeks (around 07/31/2024).   The patient's condition of epilepsy requires frequent monitoring and adjustments in the treatment plan, reflecting the ongoing complexity of care.  This provider is the continuing focal point for all needed services for this condition.   Pastor Falling, MD 06/26/2024, 6:25 PM  [facility] [address] [place], [place] [address] [phone]

## 2024-06-27 ENCOUNTER — Telehealth: Payer: Self-pay | Admitting: Neurology

## 2024-06-27 ENCOUNTER — Ambulatory Visit: Payer: Self-pay | Admitting: Neurology

## 2024-06-27 LAB — COMPREHENSIVE METABOLIC PANEL WITH GFR
ALT: 33 IU/L (ref 0–44)
AST: 22 IU/L (ref 0–40)
Albumin: 4.7 g/dL (ref 4.3–5.2)
Alkaline Phosphatase: 90 IU/L (ref 44–121)
BUN/Creatinine Ratio: 7 — ABNORMAL LOW (ref 9–20)
BUN: 7 mg/dL (ref 6–20)
Bilirubin Total: 0.3 mg/dL (ref 0.0–1.2)
CO2: 20 mmol/L (ref 20–29)
Calcium: 9.6 mg/dL (ref 8.7–10.2)
Chloride: 105 mmol/L (ref 96–106)
Creatinine, Ser: 0.95 mg/dL (ref 0.76–1.27)
Globulin, Total: 2.7 g/dL (ref 1.5–4.5)
Glucose: 92 mg/dL (ref 70–99)
Potassium: 4.2 mmol/L (ref 3.5–5.2)
Sodium: 142 mmol/L (ref 134–144)
Total Protein: 7.4 g/dL (ref 6.0–8.5)
eGFR: 113 mL/min/1.73 (ref >59)

## 2024-06-27 LAB — CBC WITH DIFFERENTIAL/PLATELET
Basophils Absolute: 0 x10E3/uL (ref 0.0–0.2)
Basos: 1 %
EOS (ABSOLUTE): 0.2 x10E3/uL (ref 0.0–0.4)
Eos: 3 %
Hematocrit: 46.1 % (ref 37.5–51.0)
Hemoglobin: 15.2 g/dL (ref 13.0–17.7)
Immature Grans (Abs): 0 x10E3/uL (ref 0.0–0.1)
Immature Granulocytes: 0 %
Lymphocytes Absolute: 3 x10E3/uL (ref 0.7–3.1)
Lymphs: 47 %
MCH: 30.5 pg (ref 26.6–33.0)
MCHC: 33 g/dL (ref 31.5–35.7)
MCV: 93 fL (ref 79–97)
Monocytes Absolute: 0.5 x10E3/uL (ref 0.1–0.9)
Monocytes: 8 %
Neutrophils Absolute: 2.6 x10E3/uL (ref 1.4–7.0)
Neutrophils: 41 %
Platelets: 289 x10E3/uL (ref 150–450)
RBC: 4.98 x10E6/uL (ref 4.14–5.80)
RDW: 13.5 % (ref 11.6–15.4)
WBC: 6.2 x10E3/uL (ref 3.4–10.8)

## 2024-06-27 LAB — VALPROIC ACID LEVEL: Valproic Acid Lvl: 47 ug/mL — ABNORMAL LOW (ref 50–100)

## 2024-06-27 LAB — AMMONIA: Ammonia: 59 ug/dL (ref 36–136)

## 2024-06-27 NOTE — Telephone Encounter (Signed)
 Referral for neurosurgery fax to [facility]. Phone: [phone]

## 2024-07-03 ENCOUNTER — Other Ambulatory Visit: Payer: Self-pay

## 2024-07-03 ENCOUNTER — Ambulatory Visit
Admission: RE | Admit: 2024-07-03 | Discharge: 2024-07-03 | Disposition: A | Discharge status: Home / Self Care | Source: Ambulatory Visit | Attending: Family Medicine | Admitting: Family Medicine

## 2024-07-03 VITALS — BP 117/78 | HR 100 | Temp 98.5°F | Resp 16

## 2024-07-03 DIAGNOSIS — K1120 Sialoadenitis, unspecified: Secondary | ICD-10-CM

## 2024-07-03 MED ORDER — AMOXICILLIN-POT CLAVULANATE 875-125 MG PO TABS: 1.0000 | ORAL_TABLET | Freq: Two times a day (BID) | ORAL | 0 refills | Status: DC

## 2024-07-03 NOTE — ED Triage Notes (Signed)
 Pt c/o right ear pain and pain all around the earx2d. Pt has 1+ swelling on right side of face near ear.

## 2024-07-03 NOTE — ED Provider Notes (Addendum)
 [facility]    CSN: [id number] Arrival date & time: 07/03/24  1058      History   Chief Complaint Chief Complaint  Patient presents with   Otalgia    HPI Mario Davis is a 28 y.o. male with a past medical history of seizures presents for right ear and jaw pain.  Patient reports 2 days of worsening right ear pain/right jaw pain.  States he feels like the side of his face has been swollen but denies redness or warmth.  Denies any dental pain or gum pain.  No fevers or chills.  No drainage from the ear or change in hearing.  No URI symptoms.  No neck pain.  Denies dry mouth  or pain with chewing denies TMJ pain or clicking.  No injury to the area.  States he is up-to-date on routine vaccines.  He has been taking ibuprofen  with improvement.  No other concerns at this time.   Otalgia Associated symptoms: no fever     Past Medical History:  Diagnosis Date   Seizures [facility])     Patient Active Problem List   Diagnosis Date Noted   Seizure (HCC) 05/26/2021   Abdominal pain in male 04/14/2015    History reviewed. No pertinent surgical history.     Home Medications    Prior to Admission medications   Medication Sig Start Date End Date Taking? Authorizing Provider  amoxicillin -clavulanate (AUGMENTIN ) 875-125 MG tablet Take 1 tablet by mouth every 12 (twelve) hours. 07/03/24  Yes Yovan Leeman, Jodi R, NP  brivaracetam  (BRIVIACT ) 100 MG TABS tablet Take 1 tablet (100 mg total) by mouth 2 (two) times daily. 06/26/24   Camara, Amadou, MD  cenobamate  (XCOPRI ) 200 MG TABS Take 1 tablet (200 mg total) by mouth daily. 06/26/24   Camara, Amadou, MD  clonazePAM  (KLONOPIN ) 0.5 MG tablet Take 1 tablet (0.5 mg total) by mouth 2 (two) times daily. 06/21/24   Camara, Amadou, MD  cyclobenzaprine  (FLEXERIL ) 10 MG tablet Take 1 tablet (10 mg total) by mouth as needed for up to 10 doses for muscle spasms. 06/27/23   Camara, Amadou, MD  diazePAM , 20 MG Dose, (VALTOCO  20 MG DOSE) 2 x 10  MG/0.1ML LQPK Spray 2 devices, 1 in each nostril as needed for seizures. Do not use again within 5 days of last use. Max of 5 doses per month. 09/21/23   Gregg Lek, MD  ibuprofen  (ADVIL ) 200 MG tablet Take 200 mg by mouth every 6 (six) hours as needed for moderate pain.    [provider]  Rimegepant Sulfate (NURTEC) 75 MG TBDP Take 1 tablet (75 mg total) by mouth every other day. 09/21/23   Camara, Amadou, MD  SUMAtriptan  (IMITREX ) 50 MG tablet Take 1 tablet (50 mg total) by mouth every 2 (two) hours as needed for migraine. May repeat in 2 hours if headache persists or recurs. 06/20/23   Gregg Lek, MD    Family History Family History  Problem Relation Age of Onset   Seizures Mother    Hypertension Father     Social History Social History   Tobacco Use   Smoking status: Never   Smokeless tobacco: Never  Vaping Use   Vaping status: Never Used  Substance Use Topics   Alcohol use: Yes    Comment: occ   Drug use: No     Allergies   Patient has no known allergies.   Review of Systems Review of Systems  Constitutional:  Negative for fever.  HENT:  Positive for ear pain.        Right-sided jaw pain and swelling     Physical Exam Triage Vital Signs ED Triage Vitals  Encounter Vitals Group     BP 07/03/24 1130 117/78     Girls Systolic BP Percentile --      Girls Diastolic BP Percentile --      Boys Systolic BP Percentile --      Boys Diastolic BP Percentile --      Pulse Rate 07/03/24 1130 100     Resp 07/03/24 1130 16     Temp 07/03/24 1130 98.5 F (36.9 C)     Temp Source 07/03/24 1130 Oral     SpO2 07/03/24 1130 95 %     Weight --      Height --      Head Circumference --      Peak Flow --      Pain Score 07/03/24 1128 7     Pain Loc --      Pain Education --      Exclude from Growth Chart --    No data found.  Updated Vital Signs BP 117/78   Pulse 100   Temp 98.5 F (36.9 C) (Oral)   Resp 16   SpO2 95%   Visual Acuity Right Eye  Distance:   Left Eye Distance:   Bilateral Distance:    Right Eye Near:   Left Eye Near:    Bilateral Near:     Physical Exam Vitals and nursing note reviewed.  Constitutional:      General: He is not in acute distress.    Appearance: Normal appearance. He is not ill-appearing.  HENT:     Head: Normocephalic and atraumatic.     Jaw: No trismus or pain on movement.      Comments: Very mild swelling of the right parotid gland without erythema or warmth.  No induration, fluctuance.  Area is soft.  Area is tender to palpation.  There is tenderness with palpation over Stensen's duct without any discharge.    Right Ear: Tympanic membrane and ear canal normal. No drainage or swelling.     Left Ear: Tympanic membrane and ear canal normal. No drainage or swelling.     Mouth/Throat:     Lips: Pink.     Mouth: No injury or oral lesions.     Dentition: No dental tenderness or gingival swelling.  Eyes:     Pupils: Pupils are equal, round, and reactive to light.  Cardiovascular:     Rate and Rhythm: Normal rate.  Pulmonary:     Effort: Pulmonary effort is normal.  Skin:    General: Skin is warm and dry.  Neurological:     General: No focal deficit present.     Mental Status: He is alert and oriented to person, place, and time.  Psychiatric:        Mood and Affect: Mood normal.        Behavior: Behavior normal.      UC Treatments / Results  Labs (all labs ordered are listed, but only abnormal results are displayed) Labs Reviewed - No data to display  EKG   Radiology No results found.  Procedures Procedures (including critical care time)  Medications Ordered in UC Medications - No data to display  Initial Impression / Assessment and Plan / UC Course  I have reviewed the triage vital signs and the nursing notes.  Pertinent labs &  imaging results that were available during my care of the patient were reviewed by me and considered in my medical decision making (see chart  for details).     Reviewed exam and symptoms with patient.  He is well-appearing and in no acute distress.  Discussed sialadenitis.   He declines ER evaluation today.  Will do trial of Augmentin  and advised sucking on sour candies to help increase salivary production.  He can continue ibuprofen  or Tylenol  as needed.  Cool compresses as needed.  I did advise him if he has no improvement within 24 hours and/or the symptoms worsen, red flags reviewed, he is to go to the emergency room ASAP for additional workup/treatment.  PCP follow-up 2 to 3 days for recheck.  Patient verbalized understanding of these instructions.  Final Clinical Impressions(s) / UC Diagnoses   Final diagnoses:  Sialadenitis     Discharge Instructions      Start Augmentin  twice daily for 7 days.  Suck on sour candies frequently to help increase saliva production.  You may do cool compresses to the jaw as needed.  You may continue over-the-counter Tylenol  or ibuprofen  as needed.  Please monitor your symptoms very closely and if they do not improve within the next 24 hours and/or they worsen, i.e. fever, worsening pain or swelling, redness or warmth of the area, or any other concerns that arise please go to the emergency room ASAP for further workup.  Follow-up with your PCP in 2 to 3 days for recheck.  Hope you feel better soon!     ED Prescriptions     Medication Sig Dispense Auth. Provider   amoxicillin -clavulanate (AUGMENTIN ) 875-125 MG tablet Take 1 tablet by mouth every 12 (twelve) hours. 14 tablet Medha Pippen, Jodi R, NP      PDMP not reviewed this encounter.   Loreda Myla SAUNDERS, NP 07/03/24 1202    Loreda Myla SAUNDERS, NP 07/03/24 1204

## 2024-07-03 NOTE — Discharge Instructions (Addendum)
 Start Augmentin  twice daily for 7 days.  Suck on sour candies frequently to help increase saliva production.  You may do cool compresses to the jaw as needed.  You may continue over-the-counter Tylenol  or ibuprofen  as needed.  Please monitor your symptoms very closely and if they do not improve within the next 24 hours and/or they worsen, i.e. fever, worsening pain or swelling, redness or warmth of the area, or any other concerns that arise please go to the emergency room ASAP for further workup.  Follow-up with your PCP in 2 to 3 days for recheck.  Hope you feel better soon!

## 2024-07-31 ENCOUNTER — Encounter: Payer: Self-pay | Admitting: Neurology

## 2024-07-31 ENCOUNTER — Ambulatory Visit: Admitting: Neurology

## 2024-07-31 VITALS — BP 116/74 | HR 95 | Ht 73.0 in | Wt 175.0 lb

## 2024-07-31 DIAGNOSIS — F419 Anxiety disorder, unspecified: Secondary | ICD-10-CM

## 2024-07-31 DIAGNOSIS — G43709 Chronic migraine without aura, not intractable, without status migrainosus: Secondary | ICD-10-CM | POA: Diagnosis not present

## 2024-07-31 DIAGNOSIS — G40019 Localization-related (focal) (partial) idiopathic epilepsy and epileptic syndromes with seizures of localized onset, intractable, without status epilepticus: Secondary | ICD-10-CM

## 2024-07-31 MED ORDER — SILDENAFIL CITRATE 25 MG PO TABS: 25.0000 mg | ORAL_TABLET | Freq: Every day | ORAL | 0 refills | Status: AC | PRN

## 2024-07-31 MED ORDER — BUSPIRONE HCL 10 MG PO TABS: 10.0000 mg | ORAL_TABLET | Freq: Three times a day (TID) | ORAL | 3 refills | Status: AC

## 2024-07-31 MED ORDER — CLONAZEPAM 0.5 MG PO TABS: 0.5000 mg | ORAL_TABLET | Freq: Two times a day (BID) | ORAL | 5 refills | Status: DC

## 2024-07-31 NOTE — Progress Notes (Signed)
 [facility]  PATIENT: Mario Davis DOB: 1996-10-13  REFERRING CLINICIAN: Kip Ade, NP HISTORY FROM: Patient and Mario Davis  REASON FOR VISIT: Epilepsy follow up.    HISTORICAL  CHIEF COMPLAINT:  Chief Complaint  Patient presents with   Follow-up    Rm 12, sz f/u, reports medication compliance, denies SI/HI and any sz activity.   INTERVAL HISTORY 07/31/2024 Patient presents today for follow-up, he is accompanied by girlfriend.  Last visit was a month ago, at that time we discontinued Depakote  due to increased somnolence.  Since then he denies any seizure or seizure like activity.  He is compliant with his medications including Briviact , cenobamate , and Klonopin .  He tells me that anxiety is still problematic.  He reports somnolence, stated that he stays up most of the night and sleep most of the day.  Again no seizure.   INTERVAL HISTORY 06/26/2024:  Patient presents today for follow-up follow-up, he is accompanied by fianc.  Last visit was on July 21 at that time he was complaining of extreme dizziness.  I decreased his Depakote  to 500 mg twice daily but he tells me that his symptoms are still the same, still have dizziness and feels like he is better once he skipped his Depakote .  He reports that his last seizure was on August 1 witnessed by girlfriend described by generalized convulsion.  He did bite his tongue.   INTERVAL HISTORY 06/04/2024 Patient presents today for follow-up, he is alone.  Last visit was in November 2024.  At that time we continued all his medications including Depakote , cenobamate , and lacosamide .  He was experiencing dizziness, lacosamide  was discontinued and Briviact  started.  He tells me that his medications make him very sleepy, sluggish, with slurred speech.  Fortunately has not had any falls.  He tells me he last seizure was 2 weeks ago, he reports medication compliance.  Again he is not interested in VNS therapy.   He continued to have breakthrough seizures, unsure about frequency but believes he has at least seizure once a month.    INTERVAL HISTORY 09/21/2023:  Patient presents today for follow-up, he is accompanied by his fiance.  Last visit was in May, since then we added cenobamate , and increased the Depakote  to 1000 mg twice daily.  He is still having seizures, in the past 6 months he had 3 seizures, the last one being 2 weeks ago.  He reports compliant with his medications, denies missing his medications even though taking them together can cause him to feel dizzy and have tremor at times.  He still complaining of headaches, sumatriptan  does help.  He did apply for disability but was denied. He is still complaining of right arm and leg weakness and numbness, he could not complete PT because he did have a seizure during one of the session and has not gone back.     INTERVAL HISTORY 03/31/2023:  Mario Davis presents today for follow-up, he is accompanied by girlfriend.  At last visit in February we had planned to send him to the EMU.  He did have the admission, seizures were captured coming from the right temporal region.  He did also have epileptiform discharges in the right temporal region.  Upon discharge Xcopri  was added.  Currently he is on Depakote  750 mg twice daily, Vimpat  200 mg twice daily and Xcopri  currently at 50 mg daily.  He does report side effect of tremor but has not had any seizures since discharge from the hospital.  Overall he is doing well.  He is still anxious about his seizures and reports compliance with his medications, and good sleep habits. He is also applying for disability as he is unable to drive   INTERVAL HISTORY 06/09/2024:  Patient presents today for follow-up, he is accompanied by her significant other.  He is following up for his seizure.  He continued to have breakthrough seizures despite being on Vimpat  200 mg twice daily and Depakote  750 mg twice daily.  He reports compliance  with his medicine but with each of his breakthrough seizure his antiseizure levels are undetectable.  Again patient is adamant that he is taking his medications as prescribed, he has not miss any dose.  He also has missed few follow-up with me.  He feels he is having seizures during his sleep because he will wake up in the morning with sore jaw and tiredness. Girlfriend also reported that at night he will shake during his sleep and become unresponsive. He is also complaining of worsening anxiety.  So far he has tried Levetiracetam , Lamotrigine , Lacosamide  and Valproic  acid    INTERVAL HISTORY 06/02/2022 Patient presents today for follow-up, last visit was in January.  At that time he was doing well on Lamotrigine  100 mg BID.  Unfortunately in April, he did have a breakthrough seizure.  At that time his lamotrigine  level was 1.7.  Patient reports compliance with the medication.  I increase the lamotrigine  to 150 mg twice daily.  Since then he has been having worsening headache, complaint of numbness and tingling in the mainly right arm and right leg but no seizures.   INTERVAL HISTORY 11/19/2021:  Patient presents today for follow-up, last visit was on October 3.  At that time plan was to increase Lamictal  200 mg twice daily.  He reported compliance with medication, denies any seizures since last visit, denies any side effect from the medication.  No other concerns or complaints.  Interval History 08/17/2021:  Patient presented for follow-up, last visit was July 28, at that time he was started on lamotrigine  25 mg with plan to uptitrate until 100 mg twice a day.  For some reason, patient is currently taking 100 mg in the morning and 25 mg at night.  He said that he was not aware that his evening dose was supposed to be 100 mg.  He denies any seizures since last visit, denies any new rash but states that lamotrigine  is keeping him up at night.  He takes his evening dose around 8 PM.  No other complaints.  He  is currently not working.   HISTORY OF PRESENT ILLNESS:  This is a 28 year old man with no past medical history who is presenting with new onset seizure.  Patient reported 2 weeks ago he had his first lifetime seizure.  Per girlfriend who accompanied patient today she reported patient was asleep and she noted that he was staring at the ceiling, then followed by left face twitching followed by generalized body convulsions.  The entire episode lasted about 2 to 3-minute, after which he was very confused and combative.  He did not remember where he was, he wanted to leave the house and it took him about 30 minutes to regain consciousness.  He was taken to a local ED and admitted for additional work-up.  Initial lab work-up was unrevealing.  He did have a normal MRI of the brain.  He also have a routine EEG which was normal.  He was started on Keppra  and  discharged home.  Patient stated since starting the Keppra , he has been having headaches and pain behind eyes and he noted also that blood in the stool.  He also reported he was having some mood changes, he became angry for no reason.  He has discontinued the Keppra  4 days ago. He has a family history of seizures mother and uncle had seizures.  He denies any previous history of head trauma no other seizure risk factors noted. Since being discharged only ED he has not had any additional seizure.  He is currently not driving.  She understand that he is not supposed to drive for at least 6 months.  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  He continues to play but has not had any additional incident  Currently he denies any headaches no change in vision no abdominal pain no other complaint.   One additional issue that he brought up is chest pain 4  months ago, described as could not breathe, happened every other night, seen his PMD, had a EKG and told that everything is fine.   REVIEW OF SYSTEMS: Full 14 system review of systems performed and negative with exception of: except as noted in the HPI  ALLERGIES: No Known Allergies  HOME MEDICATIONS: Outpatient Medications Prior to Visit  Medication Sig Dispense Refill   brivaracetam  (BRIVIACT ) 100 MG TABS tablet Take 1 tablet (100 mg total) by mouth 2 (two) times daily. 60 tablet 5   cenobamate  (XCOPRI ) 200 MG TABS Take 1 tablet (200 mg total) by mouth daily. 30 tablet 5   cyclobenzaprine  (FLEXERIL ) 10 MG tablet Take 1 tablet (10 mg total) by mouth as needed for up to 10 doses for muscle spasms. 10 tablet 0   diazePAM , 20 MG Dose, (VALTOCO  20 MG DOSE) 2 x 10 MG/0.1ML LQPK Spray 2 devices, 1 in each nostril as needed for seizures. Do not use again within 5 days of last use. Max of 5 doses per month. 10 each 0   ibuprofen  (ADVIL ) 200 MG tablet Take 200 mg by mouth every 6 (six) hours as needed for moderate pain.     Rimegepant Sulfate (NURTEC) 75 MG TBDP Take 1 tablet (75 mg total) by mouth every other day. 16 tablet 11   SUMAtriptan  (IMITREX ) 50 MG tablet Take 1 tablet (50 mg total) by mouth every 2 (two) hours as needed for migraine. May repeat in 2 hours if headache persists or recurs. 10 tablet 6   clonazePAM  (KLONOPIN ) 0.5 MG tablet Take 1 tablet (0.5 mg total) by mouth 2 (two) times daily. 60 tablet 2   amoxicillin -clavulanate (AUGMENTIN ) 875-125 MG tablet Take 1 tablet by mouth every 12 (twelve) hours. (Patient not taking: Reported on 07/31/2024) 14 tablet 0   No facility-administered medications prior to visit.    PAST MEDICAL HISTORY: Past Medical History:  Diagnosis Date   Seizures (HCC)     PAST SURGICAL HISTORY: History reviewed. No pertinent surgical history.  FAMILY HISTORY: Family History  Problem Relation Age of Onset   Seizures Mother    Hypertension Father      SOCIAL HISTORY: Social History   Socioeconomic History   Marital status: Significant Other    Spouse name: Not on file   Number of children:  0   Years of education: Not on file   Highest education level: Not on file  Occupational History   Not on file  Tobacco Use   Smoking status: Never   Smokeless tobacco: Never  Vaping Use   Vaping status: Never Used  Substance and Sexual Activity   Alcohol use: Yes    Comment: occ   Drug use: No   Sexual activity: Not on file  Other Topics Concern   Not on file  Social History Narrative   Lives with girlfriend [name]   Right Handed   Drinks 6-8 cups caffeine daily   Doesn't work   Social Drivers of Corporate investment banker Strain: Not on file  Food Insecurity: No Food Insecurity (02/14/2023)   Hunger Vital Sign    Worried About Running Out of Food in the Last Year: Never true    Ran Out of Food in the Last Year: Never true  Transportation Needs: No Transportation Needs (02/14/2023)   PRAPARE - Administrator, Civil Service (Medical): No    Lack of Transportation (Non-Medical): No  Physical Activity: Not on file  Stress: Not on file  Social Connections: Not on file  Intimate Partner Violence: Not At Risk (02/14/2023)   Humiliation, Afraid, Rape, and Kick questionnaire    Fear of Current or Ex-Partner: No    Emotionally Abused: No    Physically Abused: No    Sexually Abused: No    PHYSICAL EXAM  GENERAL EXAM/CONSTITUTIONAL: Vitals:  Vitals:   07/31/24 1534  BP: 116/74  Pulse: 95  SpO2: 96%  Weight: 175 lb (79.4 kg)  Height: 6' 1 (1.854 m)     Body mass index is 23.09 kg/m. Wt Readings from Last 3 Encounters:  07/31/24 175 lb (79.4 kg)  06/26/24 179 lb 8 oz (81.4 kg)  09/21/23 182 lb 8 oz (82.8 kg)   Patient is in no distress; well developed, nourished and groomed; neck is supple   NEUROLOGIC: MENTAL STATUS:      No data to display         awake, alert, oriented to person, place and  time recent and remote memory intact normal attention and concentration Mild to moderate dysarthria, comprehension intact, naming intact fund of knowledge appropriate  CRANIAL NERVE:  2nd, 3rd, 4th, 6th -visual fields full to confrontation, extraocular muscles intact, no nystagmus 5th - facial sensation symmetric 7th - facial strength symmetric 8th - hearing intact 9th - palate elevates symmetrically, uvula midline 11th - shoulder shrug symmetric 12th - tongue protrusion midline   MOTOR:   4/5 weakness in the right upper and lower extremity when compared to he left   He does have weak hand grip on the right 4/5  GAIT/STATION:  normal   DIAGNOSTIC DATA (LABS, IMAGING, TESTING) - I reviewed patient records, labs, notes, testing and imaging myself where available.  Lab Results  Component Value Date   WBC 6.2 06/26/2024   HGB 15.2 06/26/2024   HCT 46.1 06/26/2024   MCV 93 06/26/2024   PLT 289 06/26/2024      Component Value Date/Time   NA 142 06/26/2024 1615   K 4.2 06/26/2024 1615   CL 105 06/26/2024 1615   CO2 20 06/26/2024 1615   GLUCOSE 92 06/26/2024 1615   GLUCOSE 129 (H) 02/14/2023 1036   BUN 7 06/26/2024 1615   CREATININE 0.95 06/26/2024 1615   CALCIUM 9.6 06/26/2024 1615   PROT 7.4 06/26/2024 1615   ALBUMIN 4.7  06/26/2024 1615   AST 22 06/26/2024 1615   ALT 33 06/26/2024 1615   ALKPHOS 90 06/26/2024 1615   BILITOT 0.3 06/26/2024 1615   GFRNONAA >60 02/14/2023 1036   GFRAA >60 11/21/2017 2155   No results found for: CHOL, HDL, LDLCALC, LDLDIRECT, TRIG, CHOLHDL Lab Results  Component Value Date   HGBA1C 5.6 02/15/2023   Lab Results  Component Value Date   VITAMINB12 338 07/13/2022   Lab Results  Component Value Date   TSH 0.368 05/27/2021    MRI Brain: Reviewed, within normal limits   Routine EEG: Normal routine EEG  EMU 02/16/23:  ABNORMALITY - Spike, right anterior temporal region - Focal seizure, right temporal region    IMPRESSION: This study showed two focal seizures as described above on 02/15/2023 at 2135 and on 02/16/2023 at 0239, lasting about 2 and 3 minutes respectively. Additionally there is epileptogenicity in right anterior temporal region.   ASSESSMENT AND PLAN  28 y.o. year old male here for follow-up for his intractable epilepsy and headaches.   For his epilepsy, currently he is on cenobamate  200 mg daily,  Briviact  100 mg twice daily, and Klonopin  0.5 mg twice daily.  He denies any seizure but reports somnolence during the daytime.  He also tells me that he does not sleep well at night.  Plan will be to continue the patient on current medication including cenobamate , Briviact , Klonopin .  I will also give him BuSpar  for his increased anxiety and refer him to behavioral health. He does report erectile dysfunction, will give him a trial of Viagra .  I will see him in 2 to 3 months for follow-up or sooner if worse.   1. Partial idiopathic epilepsy with seizures of localized onset, intractable, without status epilepticus (HCC)   2. Anxiety   3. Chronic migraine without aura without status migrainosus, not intractable     Patient Instructions  Continue with cenobamate  200 mg daily Continue with Briviact  100 mg twice daily Continue Klonopin  0.5 mg twice daily Start BuSpar  10 mg 3 times daily Continue your other medications Referral to behavioral health for management of anxiety Trial of Viagra  for erectile dysfunction Follow-up in 2 to 3 months or sooner if worse.  --------------------------------------------   Per [place]  DMV statutes, patients with seizures are not allowed to drive until they have been seizure-free for six months.  Other recommendations include using caution when using heavy equipment or power tools. Avoid working on ladders or at heights. Take showers instead of baths.  Do not swim alone.  Ensure the water  temperature is not too high on the home water  heater. Do not go  swimming alone. Do not lock yourself in a room alone (i.e. bathroom). When caring for infants or small children, sit down when holding, feeding, or changing them to minimize risk of injury to the child in the event you have a seizure. Maintain good sleep hygiene. Avoid alcohol.  Also recommend adequate sleep, hydration, good diet and minimize stress.   During the Seizure  - First, ensure adequate ventilation and place patients on the floor on their left side  Loosen clothing around the neck and ensure the airway is patent. If the patient is clenching the teeth, do not force the mouth open with any object as this can cause severe damage - Remove all items from the surrounding that can be hazardous. The patient may be oblivious to what's happening and may not even know what he or she is doing. If the patient  is confused and wandering, either gently guide him/her away and block access to outside areas - Reassure the individual and be comforting - Call 911. In most cases, the seizure ends before EMS arrives. However, there are cases when seizures may last over 3 to 5 minutes. Or the individual may have developed breathing difficulties or severe injuries. If a pregnant patient or a person with diabetes develops a seizure, it is prudent to call an ambulance. - Finally, if the patient does not regain full consciousness, then call EMS. Most patients will remain confused for about 45 to 90 minutes after a seizure, so you must use judgment in calling for help. - Avoid restraints but make sure the patient is in a bed with padded side rails - Place the individual in a lateral position with the neck slightly flexed; this will help the saliva drain from the mouth and prevent the tongue from falling backward - Remove all nearby furniture and other hazards from the area - Provide verbal assurance as the individual is regaining consciousness - Provide the patient with privacy if possible - Call for help and start  treatment as ordered by the caregiver   After the Seizure (Postictal Stage)  After a seizure, most patients experience confusion, fatigue, muscle pain and/or a headache. Thus, one should permit the individual to sleep. For the next few days, reassurance is essential. Being calm and helping reorient the person is also of importance.  Most seizures are painless and end spontaneously. Seizures are not harmful to others but can lead to complications such as stress on the lungs, brain and the heart. Individuals with prior lung problems may develop labored breathing and respiratory distress.     Orders Placed This Encounter  Procedures   Ambulatory referral to Behavioral Health     Meds ordered this encounter  Medications   clonazePAM  (KLONOPIN ) 0.5 MG tablet    Sig: Take 1 tablet (0.5 mg total) by mouth 2 (two) times daily.    Dispense:  60 tablet    Refill:  5   busPIRone  (BUSPAR ) 10 MG tablet    Sig: Take 1 tablet (10 mg total) by mouth 3 (three) times daily.    Dispense:  90 tablet    Refill:  3   sildenafil  (VIAGRA ) 25 MG tablet    Sig: Take 1 tablet (25 mg total) by mouth daily as needed for erectile dysfunction.    Dispense:  10 tablet    Refill:  0     Return in about 3 months (around 10/30/2024).   The patient's condition of epilepsy requires frequent monitoring and adjustments in the treatment plan, reflecting the ongoing complexity of care.  This provider is the continuing focal point for all needed services for this condition.   Pastor Falling, MD 07/31/2024, 4:50 PM  [facility] [address] [place], [place] [address] [phone]

## 2024-07-31 NOTE — Patient Instructions (Addendum)
 Continue with cenobamate  200 mg daily Continue with Briviact  100 mg twice daily Continue Klonopin  0.5 mg twice daily Start BuSpar  10 mg 3 times daily Continue your other medications Referral to behavioral health for management of anxiety Trial of Viagra  for erectile dysfunction Follow-up in 2 to 3 months or sooner if worse.

## 2024-08-01 ENCOUNTER — Telehealth: Payer: Self-pay | Admitting: Neurology

## 2024-08-01 NOTE — Telephone Encounter (Signed)
 Referral for behavioral health fax to [facility]. Phone: [phone], Fax: [phone]

## 2024-08-23 ENCOUNTER — Ambulatory Visit: Admitting: Neurology

## 2024-08-28 ENCOUNTER — Encounter: Payer: Self-pay | Admitting: Neurology

## 2024-10-23 ENCOUNTER — Ambulatory Visit: Admitting: Neurology

## 2024-10-23 ENCOUNTER — Encounter: Payer: Self-pay | Admitting: Neurology

## 2024-10-23 VITALS — BP 105/64 | HR 83 | Ht 73.0 in | Wt 178.0 lb

## 2024-10-23 DIAGNOSIS — M542 Cervicalgia: Secondary | ICD-10-CM

## 2024-10-23 DIAGNOSIS — G43709 Chronic migraine without aura, not intractable, without status migrainosus: Secondary | ICD-10-CM

## 2024-10-23 DIAGNOSIS — F419 Anxiety disorder, unspecified: Secondary | ICD-10-CM

## 2024-10-23 DIAGNOSIS — G44209 Tension-type headache, unspecified, not intractable: Secondary | ICD-10-CM

## 2024-10-23 DIAGNOSIS — G40019 Localization-related (focal) (partial) idiopathic epilepsy and epileptic syndromes with seizures of localized onset, intractable, without status epilepticus: Secondary | ICD-10-CM

## 2024-10-23 MED ORDER — CYCLOBENZAPRINE HCL 10 MG PO TABS: 10.0000 mg | ORAL_TABLET | ORAL | 0 refills | Status: AC | PRN

## 2024-10-23 MED ORDER — CENOBAMATE 200 MG PO TABS: 200.0000 mg | ORAL_TABLET | Freq: Every day | ORAL | 5 refills | Status: AC

## 2024-10-23 MED ORDER — CLONAZEPAM 0.5 MG PO TABS: 0.5000 mg | ORAL_TABLET | Freq: Two times a day (BID) | ORAL | 5 refills | Status: AC

## 2024-10-23 MED ORDER — ZONISAMIDE 100 MG PO CAPS: 200.0000 mg | ORAL_CAPSULE | Freq: Every day | ORAL | 6 refills | Status: AC

## 2024-10-23 MED ORDER — BRIVARACETAM 100 MG PO TABS: 100.0000 mg | ORAL_TABLET | Freq: Two times a day (BID) | ORAL | 5 refills | Status: AC

## 2024-10-23 NOTE — Progress Notes (Signed)
 [facility]  PATIENT: Mario Davis DOB: 11-25-1995  REFERRING CLINICIAN: Kip Ade, NP HISTORY FROM: Patient and Mario Davis  REASON FOR VISIT: Epilepsy follow up.    HISTORICAL  CHIEF COMPLAINT:  Chief Complaint  Patient presents with   Follow-up    Pt in room 12. Girlfriend in room. Here for seizure follow up.    INTERVAL HISTORY 10/23/2024 Patient presents today for follow-up, last visit was in September 2025.  At that time, he was still having breakthrough seizures, we discussed VNS therapy but he wanted to postpone.  Today he tells me that he did have 1 seizure in the month of October and on December 6 he did have 3 seizures.  Complains of soreness after the seizure but no tongue biting.  He reports compliance with the medication and denies any provoking factors for the seizures. Patient is also complaining of headaches, he does tell me that the Nurtec helped but the headaches are still present.   INTERVAL HISTORY 07/31/2024 Patient presents today for follow-up, he is accompanied by girlfriend.  Last visit was a month ago, at that time we discontinued Depakote  due to increased somnolence.  Since then he denies any seizure or seizure like activity.  He is compliant with his medications including Briviact , cenobamate , and Klonopin .  He tells me that anxiety is still problematic.  He reports somnolence, stated that he stays up most of the night and sleep most of the day.  Again no seizure.   INTERVAL HISTORY 06/26/2024:  Patient presents today for follow-up follow-up, he is accompanied by fianc.  Last visit was on July 21 at that time he was complaining of extreme dizziness.  I decreased his Depakote  to 500 mg twice daily but he tells me that his symptoms are still the same, still have dizziness and feels like he is better once he skipped his Depakote .  He reports that his last seizure was on August 1 witnessed by girlfriend described by  generalized convulsion.  He did bite his tongue.   INTERVAL HISTORY 06/04/2024 Patient presents today for follow-up, he is alone.  Last visit was in November 2024.  At that time we continued all his medications including Depakote , cenobamate , and lacosamide .  He was experiencing dizziness, lacosamide  was discontinued and Briviact  started.  He tells me that his medications make him very sleepy, sluggish, with slurred speech.  Fortunately has not had any falls.  He tells me he last seizure was 2 weeks ago, he reports medication compliance.  Again he is not interested in VNS therapy.  He continued to have breakthrough seizures, unsure about frequency but believes he has at least seizure once a month.    INTERVAL HISTORY 09/21/2023:  Patient presents today for follow-up, he is accompanied by his fiance.  Last visit was in May, since then we added cenobamate , and increased the Depakote  to 1000 mg twice daily.  He is still having seizures, in the past 6 months he had 3 seizures, the last one being 2 weeks ago.  He reports compliant with his medications, denies missing his medications even though taking them together can cause him to feel dizzy and have tremor at times.  He still complaining of headaches, sumatriptan  does help.  He did apply for disability but was denied. He is still complaining of right arm and leg weakness and numbness, he could not complete PT because he did have a seizure during one of the session and has not gone back.  INTERVAL HISTORY 03/31/2023:  Mario Davis presents today for follow-up, he is accompanied by girlfriend.  At last visit in February we had planned to send him to the EMU.  He did have the admission, seizures were captured coming from the right temporal region.  He did also have epileptiform discharges in the right temporal region.  Upon discharge Xcopri  was added.  Currently he is on Depakote  750 mg twice daily, Vimpat  200 mg twice daily and Xcopri  currently at 50 mg daily.   He does report side effect of tremor but has not had any seizures since discharge from the hospital.  Overall he is doing well.  He is still anxious about his seizures and reports compliance with his medications, and good sleep habits. He is also applying for disability as he is unable to drive   INTERVAL HISTORY 7/79/7975:  Patient presents today for follow-up, he is accompanied by her significant other.  He is following up for his seizure.  He continued to have breakthrough seizures despite being on Vimpat  200 mg twice daily and Depakote  750 mg twice daily.  He reports compliance with his medicine but with each of his breakthrough seizure his antiseizure levels are undetectable.  Again patient is adamant that he is taking his medications as prescribed, he has not miss any dose.  He also has missed few follow-up with me.  He feels he is having seizures during his sleep because he will wake up in the morning with sore jaw and tiredness. Girlfriend also reported that at night he will shake during his sleep and become unresponsive. He is also complaining of worsening anxiety.  So far he has tried Levetiracetam , Lamotrigine , Lacosamide  and Valproic  acid    INTERVAL HISTORY 06/02/2022 Patient presents today for follow-up, last visit was in January.  At that time he was doing well on Lamotrigine  100 mg BID.  Unfortunately in April, he did have a breakthrough seizure.  At that time his lamotrigine  level was 1.7.  Patient reports compliance with the medication.  I increase the lamotrigine  to 150 mg twice daily.  Since then he has been having worsening headache, complaint of numbness and tingling in the mainly right arm and right leg but no seizures.   INTERVAL HISTORY 11/19/2021:  Patient presents today for follow-up, last visit was on October 3.  At that time plan was to increase Lamictal  200 mg twice daily.  He reported compliance with medication, denies any seizures since last visit, denies any side effect  from the medication.  No other concerns or complaints.  Interval History 08/17/2021:  Patient presented for follow-up, last visit was July 28, at that time he was started on lamotrigine  25 mg with plan to uptitrate until 100 mg twice a day.  For some reason, patient is currently taking 100 mg in the morning and 25 mg at night.  He said that he was not aware that his evening dose was supposed to be 100 mg.  He denies any seizures since last visit, denies any new rash but states that lamotrigine  is keeping him up at night.  He takes his evening dose around 8 PM.  No other complaints.  He is currently not working.   HISTORY OF PRESENT ILLNESS:  This is a 28 year old man with no past medical history who is presenting with new onset seizure.  Patient reported 2 weeks ago he had his first lifetime seizure.  Per girlfriend who accompanied patient today she reported patient was asleep and she noted that  he was staring at the ceiling, then followed by left face twitching followed by generalized body convulsions.  The entire episode lasted about 2 to 3-minute, after which he was very confused and combative.  He did not remember where he was, he wanted to leave the house and it took him about 30 minutes to regain consciousness.  He was taken to a local ED and admitted for additional work-up.  Initial lab work-up was unrevealing.  He did have a normal MRI of the brain.  He also have a routine EEG which was normal.  He was started on Keppra  and discharged home.  Patient stated since starting the Keppra , he has been having headaches and pain behind eyes and he noted also that blood in the stool.  He also reported he was having some mood changes, he became angry for no reason.  He has discontinued the Keppra  4 days ago. He has a family history of seizures mother and uncle had seizures.  He denies any previous history of head trauma no other seizure risk factors noted. Since being discharged only ED he has not had any  additional seizure.  He is currently not driving.  She understand that he is not supposed to drive for at least 6 months.  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  He continues to play but has not had any additional incident  Currently he denies any headaches no change in vision no abdominal pain no other complaint.   One additional issue that he brought up is chest pain 4 months ago, described as could not breathe, happened every other night, seen his PMD, had a EKG and told that everything is fine.   REVIEW OF SYSTEMS: Full 14 system review of systems performed and negative with exception of: except as noted in the HPI  ALLERGIES: No Known Allergies  HOME MEDICATIONS: Outpatient Medications Prior to Visit  Medication Sig Dispense Refill   busPIRone  (BUSPAR ) 10 MG tablet Take 1 tablet (10 mg total) by mouth 3 (three) times daily. 90 tablet 3   diazePAM , 20 MG Dose, (VALTOCO  20 MG DOSE) 2 x 10 MG/0.1ML LQPK Spray 2 devices, 1 in each nostril as needed for seizures. Do not use again within 5 days of last use. Max of 5 doses per month. 10 each 0   ibuprofen  (ADVIL ) 200 MG tablet Take 200 mg by mouth every 6 (six) hours as needed for moderate pain.     Rimegepant Sulfate (NURTEC) 75 MG TBDP Take 1 tablet (75 mg total) by mouth every other day. 16 tablet 11   sildenafil  (VIAGRA ) 25 MG tablet Take 1 tablet (25 mg total) by mouth daily as needed for erectile dysfunction. 10 tablet 0   SUMAtriptan  (IMITREX ) 50 MG tablet Take 1 tablet (50 mg total) by mouth every 2 (two) hours as needed for migraine. May repeat in 2 hours if headache persists or recurs. 10 tablet 6   brivaracetam  (BRIVIACT ) 100 MG TABS tablet Take 1 tablet (100 mg total) by mouth 2 (two) times daily. 60 tablet 5    cenobamate  (XCOPRI ) 200 MG TABS Take 1 tablet (200 mg total) by mouth daily. 30 tablet 5   clonazePAM  (KLONOPIN ) 0.5 MG tablet Take 1 tablet (0.5 mg total)  by mouth 2 (two) times daily. 60 tablet 5   cyclobenzaprine  (FLEXERIL ) 10 MG tablet Take 1 tablet (10 mg total) by mouth as needed for up to 10 doses for muscle spasms. 10 tablet 0   amoxicillin -clavulanate (AUGMENTIN ) 875-125 MG tablet Take 1 tablet by mouth every 12 (twelve) hours. (Patient not taking: Reported on 10/23/2024) 14 tablet 0   No facility-administered medications prior to visit.    PAST MEDICAL HISTORY: Past Medical History:  Diagnosis Date   Seizures (HCC)     PAST SURGICAL HISTORY: History reviewed. No pertinent surgical history.  FAMILY HISTORY: Family History  Problem Relation Age of Onset   Seizures Mother    Hypertension Father     SOCIAL HISTORY: Social History   Socioeconomic History   Marital status: Significant Other    Spouse name: Not on file   Number of children: 0   Years of education: Not on file   Highest education level: Not on file  Occupational History   Not on file  Tobacco Use   Smoking status: Never   Smokeless tobacco: Never  Vaping Use   Vaping status: Never Used  Substance and Sexual Activity   Alcohol use: Not Currently    Comment: occ   Drug use: No   Sexual activity: Not on file  Other Topics Concern   Not on file  Social History Narrative   Lives with girlfriend [name]   Right Handed   Drinks 6-8 cups caffeine daily   Doesn't work   Social Drivers of Corporate Investment Banker Strain: Not on file  Food Insecurity: Medium Risk (08/10/2024)   Received from [facility]   Hunger Vital Sign    Within the past 12 months, you worried that your food would run out before you got money to buy more: Sometimes true    Within the past 12 months, the food you bought just didn't last and you didn't have money to get more. : Sometimes true  Transportation Needs: No  Transportation Needs (08/10/2024)   Received from [facility]    In the past 12 months, has lack of reliable transportation kept you from medical appointments, meetings, work or from getting things needed for daily living? : No  Physical Activity: Not on file  Stress: Not on file  Social Connections: Not on file  Intimate Partner Violence: Not At Risk (02/14/2023)   Humiliation, Afraid, Rape, and Kick questionnaire    Fear of Current or Ex-Partner: No    Emotionally Abused: No    Physically Abused: No    Sexually Abused: No    PHYSICAL EXAM  GENERAL EXAM/CONSTITUTIONAL: Vitals:  Vitals:   10/23/24 1514  BP: 105/64  Pulse: 83  Weight: 178 lb (80.7 kg)  Height: 6' 1 (1.854 m)     Body mass index is 23.48 kg/m. Wt Readings from Last 3 Encounters:  10/23/24 178 lb (80.7 kg)  07/31/24 175 lb (79.4 kg)  06/26/24 179 lb 8 oz (81.4 kg)   Patient is in no distress; well developed, nourished and groomed; tenderness to palpation in the cervical paraspinal muscles   NEUROLOGIC: MENTAL STATUS:      No data to display         awake, alert, oriented to person, place and time recent and remote memory intact normal attention and concentration Mild to moderate dysarthria, comprehension intact, naming intact fund of knowledge appropriate  CRANIAL NERVE:  2nd, 3rd, 4th, 6th -visual fields full to  confrontation, extraocular muscles intact, no nystagmus 5th - facial sensation symmetric 7th - facial strength symmetric 8th - hearing intact 9th - palate elevates symmetrically, uvula midline 11th - shoulder shrug symmetric 12th - tongue protrusion midline   MOTOR:   4/5 weakness in the right upper and lower extremity when compared to he left   He does have weak hand grip on the right 4/5  GAIT/STATION:  normal   DIAGNOSTIC DATA (LABS, IMAGING, TESTING) - I reviewed patient records, labs, notes, testing and imaging myself where available.  Lab Results   Component Value Date   WBC 6.2 06/26/2024   HGB 15.2 06/26/2024   HCT 46.1 06/26/2024   MCV 93 06/26/2024   PLT 289 06/26/2024      Component Value Date/Time   NA 142 06/26/2024 1615   K 4.2 06/26/2024 1615   CL 105 06/26/2024 1615   CO2 20 06/26/2024 1615   GLUCOSE 92 06/26/2024 1615   GLUCOSE 129 (H) 02/14/2023 1036   BUN 7 06/26/2024 1615   CREATININE 0.95 06/26/2024 1615   CALCIUM 9.6 06/26/2024 1615   PROT 7.4 06/26/2024 1615   ALBUMIN 4.7 06/26/2024 1615   AST 22 06/26/2024 1615   ALT 33 06/26/2024 1615   ALKPHOS 90 06/26/2024 1615   BILITOT 0.3 06/26/2024 1615   GFRNONAA >60 02/14/2023 1036   GFRAA >60 11/21/2017 2155   No results found for: CHOL, HDL, LDLCALC, LDLDIRECT, TRIG, CHOLHDL Lab Results  Component Value Date   HGBA1C 5.6 02/15/2023   Lab Results  Component Value Date   VITAMINB12 338 07/13/2022   Lab Results  Component Value Date   TSH 0.368 05/27/2021    MRI Brain: Reviewed, within normal limits   Routine EEG: Normal routine EEG  EMU 02/16/23:  ABNORMALITY - Spike, right anterior temporal region - Focal seizure, right temporal region   IMPRESSION: This study showed two focal seizures as described above on 02/15/2023 at 2135 and on 02/16/2023 at 0239, lasting about 2 and 3 minutes respectively. Additionally there is epileptogenicity in right anterior temporal region.   ASSESSMENT AND PLAN  28 y.o. year old male here for follow-up for his intractable epilepsy and headaches.   For his epilepsy, currently he is on cenobamate  200 mg daily,  Briviact  100 mg twice daily, and Klonopin  0.5 mg twice daily.   He continues to have breakthrough seizure, 1 in November and 3 seizures on December 6.  Plan will be to continue his current medication and add zonisamide  due to his headaches.  If he continued to have any additional seizures, we will most likely increase his cenobamate  and Klonopin .  I will also refer him to [facility] for  surgical evaluation.     1. Partial idiopathic epilepsy with seizures of localized onset, intractable, without status epilepticus (HCC)   2. Chronic migraine without aura without status migrainosus, not intractable   3. Anxiety   4. Tension headache   5. Cervicalgia      Patient Instructions  Continue current medications including Briviact  100 mg twice daily Xcopri  200 mg daily Klonopin  0.5 mg twice daily Will add zonisamide  due to ongoing seizures and ongoing headaches Continue your other medications Referral to [facility] for surgical evaluation Follow-up in 2 to 3 months or sooner if worse  --------------------------------------------   Per [place]  DMV statutes, patients with seizures are not allowed to drive until they have been seizure-free for six months.  Other recommendations include using caution when using heavy equipment  or power tools. Avoid working on ladders or at heights. Take showers instead of baths.  Do not swim alone.  Ensure the water  temperature is not too high on the home water  heater. Do not go swimming alone. Do not lock yourself in a room alone (i.e. bathroom). When caring for infants or small children, sit down when holding, feeding, or changing them to minimize risk of injury to the child in the event you have a seizure. Maintain good sleep hygiene. Avoid alcohol.  Also recommend adequate sleep, hydration, good diet and minimize stress.   During the Seizure  - First, ensure adequate ventilation and place patients on the floor on their left side  Loosen clothing around the neck and ensure the airway is patent. If the patient is clenching the teeth, do not force the mouth open with any object as this can cause severe damage - Remove all items from the surrounding that can be hazardous. The patient may be oblivious to what's happening and may not even know what he or she is doing. If the patient is confused and wandering, either gently guide him/her  away and block access to outside areas - Reassure the individual and be comforting - Call 911. In most cases, the seizure ends before EMS arrives. However, there are cases when seizures may last over 3 to 5 minutes. Or the individual may have developed breathing difficulties or severe injuries. If a pregnant patient or a person with diabetes develops a seizure, it is prudent to call an ambulance. - Finally, if the patient does not regain full consciousness, then call EMS. Most patients will remain confused for about 45 to 90 minutes after a seizure, so you must use judgment in calling for help. - Avoid restraints but make sure the patient is in a bed with padded side rails - Place the individual in a lateral position with the neck slightly flexed; this will help the saliva drain from the mouth and prevent the tongue from falling backward - Remove all nearby furniture and other hazards from the area - Provide verbal assurance as the individual is regaining consciousness - Provide the patient with privacy if possible - Call for help and start treatment as ordered by the caregiver   After the Seizure (Postictal Stage)  After a seizure, most patients experience confusion, fatigue, muscle pain and/or a headache. Thus, one should permit the individual to sleep. For the next few days, reassurance is essential. Being calm and helping reorient the person is also of importance.  Most seizures are painless and end spontaneously. Seizures are not harmful to others but can lead to complications such as stress on the lungs, brain and the heart. Individuals with prior lung problems may develop labored breathing and respiratory distress.     Orders Placed This Encounter  Procedures   Ambulatory referral to Physical Therapy   Ambulatory referral to Neurology     Meds ordered this encounter  Medications   zonisamide  (ZONEGRAN ) 100 MG capsule    Sig: Take 2 capsules (200 mg total) by mouth daily.     Dispense:  60 capsule    Refill:  6   brivaracetam  (BRIVIACT ) 100 MG TABS tablet    Sig: Take 1 tablet (100 mg total) by mouth 2 (two) times daily.    Dispense:  60 tablet    Refill:  5   cenobamate  (XCOPRI ) 200 MG TABS    Sig: Take 1 tablet (200 mg total) by mouth daily.  Dispense:  30 tablet    Refill:  5   clonazePAM  (KLONOPIN ) 0.5 MG tablet    Sig: Take 1 tablet (0.5 mg total) by mouth 2 (two) times daily.    Dispense:  60 tablet    Refill:  5   cyclobenzaprine  (FLEXERIL ) 10 MG tablet    Sig: Take 1 tablet (10 mg total) by mouth as needed for up to 10 doses for muscle spasms.    Dispense:  10 tablet    Refill:  0     Return in about 13 weeks (around 01/22/2025).   The patient's condition of epilepsy requires frequent monitoring and adjustments in the treatment plan, reflecting the ongoing complexity of care.  This provider is the continuing focal point for all needed services for this condition.   Pastor Falling, MD 10/23/2024, 10:06 PM  [facility] [address], [address] [place], [place] [address] [phone]

## 2024-10-23 NOTE — Patient Instructions (Signed)
 Continue current medications including Briviact  100 mg twice daily Xcopri  200 mg daily Klonopin  0.5 mg twice daily Will add zonisamide  due to ongoing seizures and ongoing headaches Continue your other medications Referral to [facility] for surgical evaluation Follow-up in 2 to 3 months or sooner if worse

## 2024-10-24 ENCOUNTER — Telehealth: Payer: Self-pay | Admitting: Neurology

## 2024-10-24 NOTE — Telephone Encounter (Signed)
 Referral for neurology fax to [facility]. Phone: [phone], Fax: [phone]

## 2024-11-01 ENCOUNTER — Telehealth: Payer: Self-pay | Admitting: *Deleted

## 2024-11-01 NOTE — Telephone Encounter (Signed)
 Dr.Camara filled out Placard and asked it to be mailed to patient. Placard mailed to address in chart.

## 2024-11-06 ENCOUNTER — Encounter: Payer: Self-pay | Admitting: Neurology

## 2024-11-30 ENCOUNTER — Ambulatory Visit: Attending: Physical Therapy | Admitting: Physical Therapy

## 2024-11-30 NOTE — Therapy (Incomplete)
 " OUTPATIENT PHYSICAL THERAPY CERVICAL EVALUATION   Patient Name: Mario Davis MRN: [id number] DOB:Nov 10, 1996, 29 y.o., male Today's Date: 11/30/2024  END OF SESSION:   Past Medical History:  Diagnosis Date   Seizures (HCC)    No past surgical history on file. Patient Active Problem List   Diagnosis Date Noted   Seizure (HCC) 05/26/2021   Abdominal pain in male 04/14/2015    PCP: ***  REFERRING PROVIDER: Gregg Lek, MD  REFERRING DIAG: G43.709 (ICD-10-CM) - Chronic migraine without aura without status migrainosus, not intractable G44.209 (ICD-10-CM) - Tension headache M54.2 (ICD-10-CM) - Cervicalgia  THERAPY DIAG:  No diagnosis found.  Rationale for Evaluation and Treatment: Rehabilitation  ONSET DATE: 10/23/2024 (referral date)  SUBJECTIVE:                                                                                                                                                                                                         SUBJECTIVE STATEMENT: ***  Hand dominance: {MISC; OT HAND [phone]}  PERTINENT HISTORY:  ***  PAIN:  Are you having pain? {OPRCPAIN:27236}  PRECAUTIONS: {Therapy precautions:24002}  RED FLAGS: {PT Red Flags:29287}     WEIGHT BEARING RESTRICTIONS: {Yes ***/No:24003}  FALLS:  Has patient fallen in last 6 months? {fallsyesno:27318}  LIVING ENVIRONMENT: Lives with: {OPRC lives with:25569::lives with their family} Lives in: {Lives in:25570} Stairs: {opstairs:27293} Has following equipment at home: {Assistive devices:23999}  OCCUPATION: ***  PLOF: {PLOF:24004}  PATIENT GOALS: ***  NEXT MD VISIT: ***  OBJECTIVE:  Note: Objective measures were completed at Evaluation unless otherwise noted.  DIAGNOSTIC FINDINGS:  ***  PATIENT SURVEYS:  {rehab surveys:24030}  COGNITION: Overall cognitive status: {cognition:24006}  SENSATION: {sensation:27233}  POSTURE:  {posture:25561}  PALPATION: ***   CERVICAL ROM:   {AROM/PROM:27142} ROM A/PROM (deg) eval  Flexion   Extension   Right lateral flexion   Left lateral flexion   Right rotation   Left rotation    (Blank rows = not tested)  UPPER EXTREMITY ROM:  {AROM/PROM:27142} ROM Right eval Left eval  Shoulder flexion    Shoulder extension    Shoulder abduction    Shoulder adduction    Shoulder extension    Shoulder internal rotation    Shoulder external rotation    Elbow flexion    Elbow extension    Wrist flexion    Wrist extension    Wrist ulnar deviation    Wrist radial deviation    Wrist pronation    Wrist supination     (Blank rows = not tested)  UPPER EXTREMITY  MMT:  MMT Right eval Left eval  Shoulder flexion    Shoulder extension    Shoulder abduction    Shoulder adduction    Shoulder extension    Shoulder internal rotation    Shoulder external rotation    Middle trapezius    Lower trapezius    Elbow flexion    Elbow extension    Wrist flexion    Wrist extension    Wrist ulnar deviation    Wrist radial deviation    Wrist pronation    Wrist supination    Grip strength     (Blank rows = not tested)  CERVICAL SPECIAL TESTS:  {Cervical special tests:25246}  FUNCTIONAL TESTS:  {Functional tests:24029}  TREATMENT DATE: *** PT Evaluation                                                                                                                                PATIENT EDUCATION:  Education details: *** Person educated: {Person educated:25204} Education method: {Education Method:25205} Education comprehension: {Education Comprehension:25206}  HOME EXERCISE PROGRAM: ***  ASSESSMENT:  CLINICAL IMPRESSION: Patient is a *** year old *** referred to Neuro OPPT for***.   Pt's PMH is significant for: *** The following deficits were present during the exam: ***. Based on ***, pt is an incr risk for falls. Pt would benefit from skilled PT to address  these impairments and functional limitations to maximize functional mobility independence   OBJECTIVE IMPAIRMENTS: {opptimpairments:25111}.   ACTIVITY LIMITATIONS: {activitylimitations:27494}  PARTICIPATION LIMITATIONS: {participationrestrictions:25113}  PERSONAL FACTORS: {Personal factors:25162} are also affecting patient's functional outcome.   REHAB POTENTIAL: {rehabpotential:25112}  CLINICAL DECISION MAKING: {clinical decision making:25114}  EVALUATION COMPLEXITY: {Evaluation complexity:25115}   GOALS: Goals reviewed with patient? {yes/no:20286}  SHORT TERM GOALS: Target date: ***  *** Baseline:  Goal status: INITIAL  2.  *** Baseline:  Goal status: INITIAL  3.  *** Baseline:  Goal status: INITIAL  4.  *** Baseline:  Goal status: INITIAL  5.  *** Baseline:  Goal status: INITIAL  6.  *** Baseline:  Goal status: INITIAL  LONG TERM GOALS: Target date: ***  *** Baseline:  Goal status: INITIAL  2.  *** Baseline:  Goal status: INITIAL  3.  *** Baseline:  Goal status: INITIAL  4.  *** Baseline:  Goal status: INITIAL  5.  *** Baseline:  Goal status: INITIAL  6.  *** Baseline:  Goal status: INITIAL   PLAN:  PT FREQUENCY: {rehab frequency:25116}  PT DURATION: {rehab duration:25117}  PLANNED INTERVENTIONS: {rehab planned interventions:25118::97110-Therapeutic exercises,97530- Therapeutic [phone]- Neuromuscular re-education,97535- Self Rjmz,02859- Manual therapy,Patient/Family education}  PLAN FOR NEXT SESSION: ***   Waddell Southgate, PT Waddell Southgate, PT, DPT, CSRS  11/30/2024, 7:42 AM   For all possible CPT codes, reference the Planned Interventions line above.     Check all conditions that are expected to impact treatment: {Conditions expected to impact treatment:{Conditions expected to impact treatment:28273}   If treatment provided at  initial evaluation, no treatment charged due to lack of authorization.            "

## 2025-01-22 ENCOUNTER — Ambulatory Visit: Admitting: Neurology
